# Patient Record
Sex: Male | Born: 1980
Health system: Southern US, Community
[De-identification: ages and names within clinical notes are randomized; demographics above are authoritative.]

## PROBLEM LIST (undated history)

## (undated) DIAGNOSIS — F988 Other specified behavioral and emotional disorders with onset usually occurring in childhood and adolescence: Secondary | ICD-10-CM

## (undated) DIAGNOSIS — I10 Essential (primary) hypertension: Secondary | ICD-10-CM

## (undated) DIAGNOSIS — I8390 Asymptomatic varicose veins of unspecified lower extremity: Secondary | ICD-10-CM

## (undated) DIAGNOSIS — K219 Gastro-esophageal reflux disease without esophagitis: Secondary | ICD-10-CM

## (undated) HISTORY — PX: TYMPANOSTOMY TUBE PLACEMENT: SHX32

## (undated) HISTORY — DX: Other specified behavioral and emotional disorders with onset usually occurring in childhood and adolescence: F98.8

## (undated) HISTORY — DX: Essential (primary) hypertension: I10

## (undated) HISTORY — DX: Gastro-esophageal reflux disease without esophagitis: K21.9

## (undated) HISTORY — DX: Asymptomatic varicose veins of unspecified lower extremity: I83.90

---

## 2006-02-04 ENCOUNTER — Ambulatory Visit: Payer: Self-pay | Admitting: Family Medicine

## 2006-07-25 ENCOUNTER — Emergency Department (HOSPITAL_COMMUNITY): Admission: EM | Admit: 2006-07-25 | Discharge: 2006-07-25 | Payer: Self-pay | Admitting: Family Medicine

## 2006-08-14 ENCOUNTER — Ambulatory Visit: Payer: Self-pay | Admitting: Family Medicine

## 2006-08-18 ENCOUNTER — Ambulatory Visit: Payer: Self-pay | Admitting: Family Medicine

## 2006-08-30 ENCOUNTER — Ambulatory Visit: Payer: Self-pay | Admitting: Family Medicine

## 2006-09-17 ENCOUNTER — Emergency Department (HOSPITAL_COMMUNITY): Admission: EM | Admit: 2006-09-17 | Discharge: 2006-09-17 | Payer: Self-pay | Admitting: Family Medicine

## 2006-11-09 ENCOUNTER — Ambulatory Visit: Payer: Self-pay | Admitting: Family Medicine

## 2006-11-29 ENCOUNTER — Ambulatory Visit: Payer: Self-pay | Admitting: Family Medicine

## 2006-11-29 LAB — CONVERTED CEMR LAB
ALT: 28 units/L (ref 0–40)
AST: 40 units/L — ABNORMAL HIGH (ref 0–37)
Albumin: 4.1 g/dL (ref 3.5–5.2)
Alkaline Phosphatase: 62 units/L (ref 39–117)
BUN: 7 mg/dL (ref 6–23)
Basophils Absolute: 0 10*3/uL (ref 0.0–0.1)
Basophils Relative: 0.1 % (ref 0.0–1.0)
CO2: 31 meq/L (ref 19–32)
Calcium: 9 mg/dL (ref 8.4–10.5)
Chloride: 102 meq/L (ref 96–112)
Creatinine, Ser: 0.9 mg/dL (ref 0.4–1.5)
Eosinophils Absolute: 0 10*3/uL (ref 0.0–0.6)
Eosinophils Relative: 0.3 % (ref 0.0–5.0)
GFR calc Af Amer: 130 mL/min
GFR calc non Af Amer: 108 mL/min
Glucose, Bld: 84 mg/dL (ref 70–99)
HCT: 46.7 % (ref 39.0–52.0)
Hemoglobin: 16 g/dL (ref 13.0–17.0)
Lymphocytes Relative: 45.4 % (ref 12.0–46.0)
MCHC: 34.2 g/dL (ref 30.0–36.0)
MCV: 87.2 fL (ref 78.0–100.0)
Monocytes Absolute: 1.1 10*3/uL — ABNORMAL HIGH (ref 0.2–0.7)
Monocytes Relative: 12.6 % — ABNORMAL HIGH (ref 3.0–11.0)
Neutro Abs: 3.6 10*3/uL (ref 1.4–7.7)
Neutrophils Relative %: 41.6 % — ABNORMAL LOW (ref 43.0–77.0)
Platelets: 278 10*3/uL (ref 150–400)
Potassium: 3.8 meq/L (ref 3.5–5.1)
RBC: 5.35 M/uL (ref 4.22–5.81)
RDW: 12.5 % (ref 11.5–14.6)
Sodium: 139 meq/L (ref 135–145)
Total Bilirubin: 0.6 mg/dL (ref 0.3–1.2)
Total Protein: 7.1 g/dL (ref 6.0–8.3)
WBC: 8.6 10*3/uL (ref 4.5–10.5)

## 2006-12-12 ENCOUNTER — Emergency Department (HOSPITAL_COMMUNITY): Admission: EM | Admit: 2006-12-12 | Discharge: 2006-12-12 | Payer: Self-pay | Admitting: Emergency Medicine

## 2006-12-24 ENCOUNTER — Ambulatory Visit: Payer: Self-pay | Admitting: Internal Medicine

## 2007-01-25 ENCOUNTER — Ambulatory Visit: Payer: Self-pay | Admitting: Internal Medicine

## 2007-04-19 ENCOUNTER — Telehealth (INDEPENDENT_AMBULATORY_CARE_PROVIDER_SITE_OTHER): Payer: Self-pay | Admitting: *Deleted

## 2007-04-26 ENCOUNTER — Ambulatory Visit: Payer: Self-pay | Admitting: Family Medicine

## 2007-04-26 DIAGNOSIS — H6980 Other specified disorders of Eustachian tube, unspecified ear: Secondary | ICD-10-CM | POA: Insufficient documentation

## 2007-08-18 ENCOUNTER — Telehealth (INDEPENDENT_AMBULATORY_CARE_PROVIDER_SITE_OTHER): Payer: Self-pay | Admitting: *Deleted

## 2007-09-08 ENCOUNTER — Ambulatory Visit: Payer: Self-pay | Admitting: Family Medicine

## 2007-09-08 LAB — CONVERTED CEMR LAB: Rapid Strep: NEGATIVE

## 2007-12-27 ENCOUNTER — Telehealth (INDEPENDENT_AMBULATORY_CARE_PROVIDER_SITE_OTHER): Payer: Self-pay | Admitting: Internal Medicine

## 2008-01-31 ENCOUNTER — Telehealth (INDEPENDENT_AMBULATORY_CARE_PROVIDER_SITE_OTHER): Payer: Self-pay | Admitting: Internal Medicine

## 2008-02-25 ENCOUNTER — Encounter: Payer: Self-pay | Admitting: Internal Medicine

## 2008-02-25 DIAGNOSIS — J4521 Mild intermittent asthma with (acute) exacerbation: Secondary | ICD-10-CM | POA: Insufficient documentation

## 2008-02-25 DIAGNOSIS — T7840XA Allergy, unspecified, initial encounter: Secondary | ICD-10-CM | POA: Insufficient documentation

## 2008-02-25 DIAGNOSIS — J45909 Unspecified asthma, uncomplicated: Secondary | ICD-10-CM | POA: Insufficient documentation

## 2008-03-22 ENCOUNTER — Ambulatory Visit: Payer: Self-pay | Admitting: Family Medicine

## 2008-03-22 DIAGNOSIS — G4700 Insomnia, unspecified: Secondary | ICD-10-CM | POA: Insufficient documentation

## 2008-04-03 ENCOUNTER — Telehealth (INDEPENDENT_AMBULATORY_CARE_PROVIDER_SITE_OTHER): Payer: Self-pay | Admitting: Internal Medicine

## 2008-06-11 ENCOUNTER — Ambulatory Visit: Payer: Self-pay | Admitting: Family Medicine

## 2008-06-11 DIAGNOSIS — L0291 Cutaneous abscess, unspecified: Secondary | ICD-10-CM | POA: Insufficient documentation

## 2008-06-11 DIAGNOSIS — L0390 Cellulitis, unspecified: Secondary | ICD-10-CM

## 2008-12-10 ENCOUNTER — Telehealth (INDEPENDENT_AMBULATORY_CARE_PROVIDER_SITE_OTHER): Payer: Self-pay | Admitting: *Deleted

## 2008-12-11 ENCOUNTER — Telehealth (INDEPENDENT_AMBULATORY_CARE_PROVIDER_SITE_OTHER): Payer: Self-pay | Admitting: Internal Medicine

## 2008-12-12 ENCOUNTER — Encounter (INDEPENDENT_AMBULATORY_CARE_PROVIDER_SITE_OTHER): Payer: Self-pay | Admitting: Internal Medicine

## 2009-01-15 ENCOUNTER — Telehealth (INDEPENDENT_AMBULATORY_CARE_PROVIDER_SITE_OTHER): Payer: Self-pay | Admitting: Internal Medicine

## 2009-04-05 ENCOUNTER — Ambulatory Visit: Payer: Self-pay | Admitting: Family Medicine

## 2009-04-05 DIAGNOSIS — F909 Attention-deficit hyperactivity disorder, unspecified type: Secondary | ICD-10-CM | POA: Insufficient documentation

## 2009-04-05 DIAGNOSIS — A6000 Herpesviral infection of urogenital system, unspecified: Secondary | ICD-10-CM

## 2009-04-05 DIAGNOSIS — A6002 Herpesviral infection of other male genital organs: Secondary | ICD-10-CM | POA: Insufficient documentation

## 2009-04-10 LAB — CONVERTED CEMR LAB
ALT: 18 units/L (ref 0–53)
AST: 23 units/L (ref 0–37)
Albumin: 4.2 g/dL (ref 3.5–5.2)
Alkaline Phosphatase: 49 units/L (ref 39–117)
BUN: 12 mg/dL (ref 6–23)
Basophils Absolute: 0 10*3/uL (ref 0.0–0.1)
Basophils Relative: 0 % (ref 0.0–3.0)
Bilirubin, Direct: 0.1 mg/dL (ref 0.0–0.3)
CO2: 30 meq/L (ref 19–32)
Calcium: 9.2 mg/dL (ref 8.4–10.5)
Chloride: 112 meq/L (ref 96–112)
Cholesterol: 164 mg/dL (ref 0–200)
Creatinine, Ser: 0.9 mg/dL (ref 0.4–1.5)
Eosinophils Absolute: 0 10*3/uL (ref 0.0–0.7)
Eosinophils Relative: 0.6 % (ref 0.0–5.0)
GFR calc non Af Amer: 105.43 mL/min (ref >60)
Glucose, Bld: 90 mg/dL (ref 70–99)
HCT: 47.8 % (ref 39.0–52.0)
HDL: 44.9 mg/dL (ref >39.00)
Hemoglobin: 16.6 g/dL (ref 13.0–17.0)
LDL Cholesterol: 108 mg/dL — ABNORMAL HIGH (ref 0–99)
Lymphocytes Relative: 44 % (ref 12.0–46.0)
Lymphs Abs: 3.2 10*3/uL (ref 0.7–4.0)
MCHC: 34.7 g/dL (ref 30.0–36.0)
MCV: 87.1 fL (ref 78.0–100.0)
Monocytes Absolute: 0.6 10*3/uL (ref 0.1–1.0)
Monocytes Relative: 7.8 % (ref 3.0–12.0)
Neutro Abs: 3.5 10*3/uL (ref 1.4–7.7)
Neutrophils Relative %: 47.6 % (ref 43.0–77.0)
Platelets: 242 10*3/uL (ref 150.0–400.0)
Potassium: 4.2 meq/L (ref 3.5–5.1)
RBC: 5.49 M/uL (ref 4.22–5.81)
RDW: 12.8 % (ref 11.5–14.6)
Sodium: 143 meq/L (ref 135–145)
TSH: 1.35 microintl units/mL (ref 0.35–5.50)
Total Bilirubin: 1 mg/dL (ref 0.3–1.2)
Total CHOL/HDL Ratio: 4
Total Protein: 7.2 g/dL (ref 6.0–8.3)
Triglycerides: 55 mg/dL (ref 0.0–149.0)
VLDL: 11 mg/dL (ref 0.0–40.0)
WBC: 7.3 10*3/uL (ref 4.5–10.5)

## 2009-04-29 ENCOUNTER — Emergency Department (HOSPITAL_COMMUNITY): Admission: EM | Admit: 2009-04-29 | Discharge: 2009-04-29 | Payer: Self-pay | Admitting: Emergency Medicine

## 2009-05-03 ENCOUNTER — Telehealth (INDEPENDENT_AMBULATORY_CARE_PROVIDER_SITE_OTHER): Payer: Self-pay | Admitting: Internal Medicine

## 2009-06-05 ENCOUNTER — Ambulatory Visit: Payer: Self-pay | Admitting: Family Medicine

## 2009-06-05 ENCOUNTER — Telehealth (INDEPENDENT_AMBULATORY_CARE_PROVIDER_SITE_OTHER): Payer: Self-pay | Admitting: Internal Medicine

## 2009-06-05 DIAGNOSIS — Z91038 Other insect allergy status: Secondary | ICD-10-CM | POA: Insufficient documentation

## 2009-07-10 ENCOUNTER — Telehealth (INDEPENDENT_AMBULATORY_CARE_PROVIDER_SITE_OTHER): Payer: Self-pay | Admitting: Internal Medicine

## 2009-08-08 ENCOUNTER — Telehealth (INDEPENDENT_AMBULATORY_CARE_PROVIDER_SITE_OTHER): Payer: Self-pay | Admitting: Internal Medicine

## 2009-09-02 ENCOUNTER — Telehealth (INDEPENDENT_AMBULATORY_CARE_PROVIDER_SITE_OTHER): Payer: Self-pay | Admitting: Internal Medicine

## 2009-09-16 ENCOUNTER — Telehealth (INDEPENDENT_AMBULATORY_CARE_PROVIDER_SITE_OTHER): Payer: Self-pay | Admitting: Internal Medicine

## 2009-10-14 ENCOUNTER — Telehealth: Payer: Self-pay | Admitting: Internal Medicine

## 2009-11-19 ENCOUNTER — Ambulatory Visit: Payer: Self-pay | Admitting: Family Medicine

## 2009-11-19 DIAGNOSIS — S335XXA Sprain of ligaments of lumbar spine, initial encounter: Secondary | ICD-10-CM | POA: Insufficient documentation

## 2009-12-19 ENCOUNTER — Telehealth: Payer: Self-pay | Admitting: Family Medicine

## 2010-01-23 ENCOUNTER — Telehealth: Payer: Self-pay | Admitting: Family Medicine

## 2010-03-05 ENCOUNTER — Telehealth: Payer: Self-pay | Admitting: Family Medicine

## 2010-04-10 ENCOUNTER — Telehealth: Payer: Self-pay | Admitting: Family Medicine

## 2010-05-13 ENCOUNTER — Telehealth: Payer: Self-pay | Admitting: Family Medicine

## 2010-06-11 ENCOUNTER — Telehealth: Payer: Self-pay | Admitting: Family Medicine

## 2010-06-26 ENCOUNTER — Telehealth: Payer: Self-pay | Admitting: Family Medicine

## 2010-07-08 ENCOUNTER — Telehealth: Payer: Self-pay | Admitting: Family Medicine

## 2010-08-11 ENCOUNTER — Telehealth: Payer: Self-pay | Admitting: Family Medicine

## 2010-09-10 ENCOUNTER — Telehealth: Payer: Self-pay | Admitting: Family Medicine

## 2010-09-11 ENCOUNTER — Ambulatory Visit: Payer: Self-pay | Admitting: Family Medicine

## 2010-10-09 ENCOUNTER — Telehealth: Payer: Self-pay | Admitting: Family Medicine

## 2010-10-10 ENCOUNTER — Telehealth: Payer: Self-pay | Admitting: Family Medicine

## 2010-10-20 ENCOUNTER — Telehealth: Payer: Self-pay | Admitting: Internal Medicine

## 2010-11-10 ENCOUNTER — Telehealth: Payer: Self-pay | Admitting: Family Medicine

## 2010-12-02 NOTE — Progress Notes (Signed)
Summary: needs refill on adderal  Phone Note Refill Request Call back at [phone] Message from:  Patient  Refills Requested: Medication #1:  ADDERALL XR 20 MG XR24H-CAP take 1 each morning for ADHD. Pt wants to pick this up on friday.  Please call when ready.  Initial call taken by: Lowella Petties CMA,  July 10, 2009 4:45 PM  Follow-up for Phone Call        refill completed   Billie-Lynn Tyler Deis FNP  July 10, 2009 5:02 PM   Patient notified as instructed by telephone.  Follow-up by: Lewanda Rife LPN,  July 11, 2009 8:12 AM    Prescriptions: ADDERALL XR 20 MG XR24H-CAP (AMPHETAMINE-DEXTROAMPHETAMINE) take 1 each morning for ADHD  #30 x 0   Entered and Authorized by:   Gildardo Griffes FNP   Signed by:   Gildardo Griffes FNP on 07/10/2009   Method used:   Print then Give to Patient   RxID:   [id number]

## 2010-12-02 NOTE — Progress Notes (Signed)
Summary: Rx for Nexium ?  Phone Note Call from Patient Call back at [phone]   Caller: Patient Call For: Bradley Coombe, FNP Summary of Call: Pt has been taking Zegerid and this is not on his insurance plan and he wants to know if this can be switched to Nexium 40 mg ?  Pt has been without his medication for 4 days.  The Nexium does not require a prior auth.  Pharmacy - [facility] [address] Initial call taken by: Sydell Axon,  January 31, 2008 4:14 PM  Follow-up for Phone Call        Rx sent electronically--let him know ..................................................................Marland KitchenBillie-Lynn Tyler Deis FNP  January 31, 2008 5:07 PM     New/Updated Medications: NEXIUM 40 MG  CPDR (ESOMEPRAZOLE MAGNESIUM) 1 each morning 30 min prior to bkft or other food   Prescriptions: NEXIUM 40 MG  CPDR (ESOMEPRAZOLE MAGNESIUM) 1 each morning 30 min prior to bkft or other food  #30 x 6   Entered and Authorized by:   Gildardo Griffes FNP   Signed by:   Gildardo Griffes FNP on 01/31/2008   Method used:   Electronically sent to ...       [facility] [address]       [address]       [place], [place]  [address]       Ph: [phone]       Fax: [phone]   RxID:   [id number]

## 2010-12-02 NOTE — Progress Notes (Signed)
Summary: prior auth needed for zegerid  Phone Note From Pharmacy   [facility]  [phone] [employer] Summary of Call: Prior Berkley Harvey is needed for zegerid, form is on your shelf Initial call taken by: Lowella Petties,  December 11, 2008 4:38 PM  Follow-up for Phone Call        is zegerid on his "approved" list?  why does he want to change from Nexium?  Billie-Lynn Tyler Deis FNP  December 11, 2008 5:03 PM   Additional Follow-up for Phone Call Additional follow up Details #1::        Because zegerid works better for his cough, which is more of a problem than the reflux.  Insurance might approve it if they know he has failed other meds. Additional Follow-up by: Lowella Petties,  December 11, 2008 5:06 PM    Additional Follow-up for Phone Call Additional follow up Details #2::    what PPI has he been on other than zegred and nexium?  did he fail nexium?  Billie-Lynn Tyler Deis FNP  December 11, 2008 5:18 PM     I think I wrote prevacid OTC on that form.                Lowella Petties  December 12, 2008 8:07 AM  form completed  Billie-Lynn Tyler Deis FNP  December 12, 2008 1:39 PM   Form faxed          Lowella Petties  December 12, 2008 2:45 PM   Additional Follow-up for Phone Call Additional follow up Details #3:: Details for Additional Follow-up Action Taken: Prior auth given Additional Follow-up by: Lowella Petties,  December 14, 2008 10:26 AM

## 2010-12-02 NOTE — Progress Notes (Signed)
Summary: refill request for adderall  Phone Note Refill Request Call back at [phone] Message from:  Patient  Refills Requested: Medication #1:  ADDERALL XR 20 MG XR24H-CAP take 1 each morning for ADHD. Phoned request from pt, please call when ready.  Initial call taken by: Lowella Petties CMA,  August 08, 2009 11:52 AM  Follow-up for Phone Call         Rx completed  Billie-Lynn Tyler Deis FNP  August 08, 2009 12:28 PM   Patient notified as instructed by telephone. Prescription left at front desk.  Follow-up by: Lewanda Rife LPN,  August 08, 2009 12:35 PM    Prescriptions: ADDERALL XR 20 MG XR24H-CAP (AMPHETAMINE-DEXTROAMPHETAMINE) take 1 each morning for ADHD  #30 x 0   Entered and Authorized by:   Gildardo Griffes FNP   Signed by:   Gildardo Griffes FNP on 08/08/2009   Method used:   Print then Give to Patient   RxID:   [id number]

## 2010-12-02 NOTE — Progress Notes (Signed)
Summary: Adderall Rx  Phone Note Call from Patient Call back at [phone]   Caller: Bradley Schaefer patient Call For: Gildardo Griffes FNP Summary of Call: Patient called for Prescription for Adderall. Would like to pick up on Wed 05/08/09. Wants to know if Willaim Sheng would let him take Adderall 10mg  in AM and 5mg  in PM. Please advise. Initial call taken by: Lewanda Rife,  May 03, 2009 5:12 PM  Follow-up for Phone Call        Rx attatched  Billie-Lynn Tyler Deis FNP  May 03, 2009 5:38 PM   Patient notified as instructed by telephone. RX is at the front desk. Follow-up by: Lewanda Rife LPN,  May 07, 980 10:52 AM    New/Updated Medications: ADDERALL 5 MG TABS (AMPHETAMINE-DEXTROAMPHETAMINE) take 2 each morning and 1 in the early afternoon   Prescriptions: ADDERALL 5 MG TABS (AMPHETAMINE-DEXTROAMPHETAMINE) take 2 each morning and 1 in the early afternoon  #90 x 0   Entered and Authorized by:   Gildardo Griffes FNP   Signed by:   Gildardo Griffes FNP on 05/03/2009   Method used:   Print then Give to Patient   RxID:   [id number]

## 2010-12-02 NOTE — Progress Notes (Signed)
Summary: rx's  Medications Added ALLEGRA 180 MG  TABS (FEXOFENADINE HCL) 1 once daily * NASONEX NASAL SPRAY 2 sprays each nostril once daily prn [BMN] ACYCLOVIR 800 MG  TABS (ACYCLOVIR) 1 qd       Phone Note Call from Patient Call back at [phone]   Caller: Patient Call For: bean Summary of Call: needs 90x3 new rxs due to ins change needs allegra 180mg , nasonex, and he's on valtrex 1 gm but wants to switch to acyclovir 90 day rx Initial call taken by: Liane Comber,  April 19, 2007 9:06 AM  Follow-up for Phone Call        need chart  Additional Follow-up for Phone Call Additional follow up Details #1::        LEFT MESSAGE ON MACHINE RX LEFT AT FRONT DESK FOR PATIENT   Additional Follow-up by: Liane Comber,  April 20, 2007 8:40 AM  New/Updated Medications: ALLEGRA 180 MG  TABS (FEXOFENADINE HCL) 1 once daily * NASONEX NASAL SPRAY 2 sprays each nostril once daily prn [BMN] ACYCLOVIR 800 MG  TABS (ACYCLOVIR) 1 qd  New/Updated Medications: ALLEGRA 180 MG  TABS (FEXOFENADINE HCL) 1 once daily * NASONEX NASAL SPRAY 2 sprays each nostril once daily prn [BMN] ACYCLOVIR 800 MG  TABS (ACYCLOVIR) 1 qd  Prescriptions: ACYCLOVIR 800 MG  TABS (ACYCLOVIR) 1 qd  #90 x 3   Entered and Authorized by:   Gildardo Griffes FNP   Signed by:   Gildardo Griffes FNP on 04/20/2007   Method used:   Print then Give to Patient   RxID:   [id number] NASONEX NASAL SPRAY 2 sprays each nostril once daily prn Brand medically necessary #3 x 3   Entered and Authorized by:   Gildardo Griffes FNP   Signed by:   Gildardo Griffes FNP on 04/20/2007   Method used:   Print then Give to Patient   RxID:   [id number] ALLEGRA 180 MG  TABS (FEXOFENADINE HCL) 1 once daily  #90 x 3   Entered and Authorized by:   Gildardo Griffes FNP   Signed by:   Gildardo Griffes FNP on 04/20/2007   Method used:   Print then Give to Patient   RxID:    [id number]

## 2010-12-02 NOTE — Progress Notes (Signed)
Summary: refill request for adderall  Phone Note Refill Request Call back at [phone]   Refills Requested: Medication #1:  ADDERALL XR 25 MG XR24H-CAP 1 tab by mouth qam. Please call pt when ready.  Initial call taken by: Lowella Petties CMA,  August 11, 2010 3:39 PM    Prescriptions: ADDERALL XR 25 MG XR24H-CAP (AMPHETAMINE-DEXTROAMPHETAMINE) 1 tab by mouth qam  #30 x 0   Entered and Authorized by:   Ruthe Mannan MD   Signed by:   Ruthe Mannan MD on 08/11/2010   Method used:   Print then Give to Patient   RxID:   [id number]   Appended Document: refill request for adderall Left message on cell phone voicemail Rx ready for pick up will be left at front desk.

## 2010-12-02 NOTE — Assessment & Plan Note (Signed)
Summary: TROUBLE SLEEPING/CLE   Vital Signs:  Patient Profile:   30 Years Old Male Weight:      179 pounds Temp:     98.5 degrees F oral Pulse rate:   79 / minute BP sitting:   124 / 86  (left arm) Cuff size:   regular  Vitals Entered By: Cooper Render (Mar 22, 2008 9:35 AM)                 Chief Complaint:  problems sleeping.  History of Present Illness: Here due to problems sleeping x2 and 1/2 wks--mind not turning off.   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 Has been using Melatonin --rare coffee, 1-2sodas/wk, tea--rare, chocolate occ.   no HA meds --to bed 9:30 to 10:30pm, up   ~5:30am --works out early 3-5xwk--3 mile run and wts --eats su[pper 5-7pm. 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  NAD Lungs:     normal respiratory effort, no intercostal retractions, no accessory muscle use, and normal breath sounds.   Heart:     normal rate, regular rhythm, and no murmur.   Neurologic:     alert & oriented X3, strength normal in all extremities, sensation intact to light touch, and gait normal.   Psych:     Oriented X3, normally interactive, good eye contact, not anxious appearing, and not depressed appearing.      Impression & Recommendations:  Problem # 1:  INSOMNIA (ICD-780.52) Assessment: New agrees to try rozerem at bedtime --samples given reviewed sleep hygeine see back as needed. His updated medication list for this problem includes:    Rozerem 8 Mg Tabs (Ramelteon) .Marland Kitchen... 1 before hs   Complete Medication List: 1)  Allegra 180 Mg  Tabs (Fexofenadine hcl) .Marland Kitchen.. 1 once daily as needed 2)  Nasonex Nasal Spray  .Marland Kitchen.. 2 sprays each nostril once daily prn 3)  Valtrex 1 Gm Tabs (Valacyclovir hcl) .... Take 1 tablet by mouth once a day 4)  Ventolin Hfa 108 (90 Base) Mcg/act Aers (Albuterol sulfate) .... 2 puffs q4h as needed wheezing 5)  Nexium 40 Mg Cpdr (Esomeprazole magnesium) .Marland Kitchen.. 1 each morning 30 min prior to bkft or other food 6)  Rozerem 8 Mg Tabs (Ramelteon) .Marland Kitchen.. 1 before hs    ] Prior Medications (reviewed today): ALLEGRA 180 MG  TABS (FEXOFENADINE HCL) 1 once daily as needed NASONEX NASAL SPRAY () 2 sprays each nostril once daily prn VALTREX 1 GM  TABS (VALACYCLOVIR HCL) Take 1 tablet by mouth once a day VENTOLIN HFA 108 (90 BASE) MCG/ACT  AERS (ALBUTEROL SULFATE) 2 puffs q4h as needed wheezing NEXIUM 40 MG  CPDR (ESOMEPRAZOLE MAGNESIUM) 1 each morning 30 min prior to bkft or other food Current Allergies: No known allergies

## 2010-12-02 NOTE — Progress Notes (Signed)
Summary: Rx Rozerem  Phone Note Call from Patient Call back at [phone]   Caller: Patient Call For: Everrett Coombe, FNP Summary of Call: Pt was given samples of Rozerem about 2 weeks ago and now would like an rx called to the pharmacy.  Pharmacy- [facility] [address] Initial call taken by: Sydell Axon,  April 03, 2008 4:52 PM  Follow-up for Phone Call        Rx sent electronically   Billie-Lynn Tyler Deis FNP  April 03, 2008 5:10 PM   Additional Follow-up for Phone Call Additional follow up Details #1::        med phoned to pharmacy. msg left on voice mail. Additional Follow-up by: Cooper Render,  April 03, 2008 5:19 PM      Prescriptions: ROZEREM 8 MG  TABS (RAMELTEON) 1 before hs  #30 x 6   Entered and Authorized by:   Gildardo Griffes FNP   Signed by:   Gildardo Griffes FNP on 04/03/2008   Method used:   Electronically sent to ...       [facility]       [address]       [place], [place]  [address]       Ph: [phone]       Fax: [phone]   RxID:   [id number]

## 2010-12-02 NOTE — Progress Notes (Signed)
Summary: refill request for valtrex  Phone Note Refill Request Message from:  Fax from Pharmacy  Refills Requested: Medication #1:  VALTREX 1 GM  TABS Take 1 tablet by mouth once a day   Last Refilled: 06/26/2010 Faxed request from [facility].  Initial call taken by: Lowella Petties CMA, AAMA,  October 10, 2010 10:22 AM    Prescriptions: VALTREX 1 GM  TABS (VALACYCLOVIR HCL) Take 1 tablet by mouth once a day  #90 x 0   Entered and Authorized by:   Ruthe Mannan MD   Signed by:   Ruthe Mannan MD on 10/10/2010   Method used:   Electronically to        [facility]* (retail)       [address]       [address] [place] [facility]       [place], [place]  [address]       Ph: [phone]       Fax: [phone]   RxID:   [id number]

## 2010-12-02 NOTE — Progress Notes (Signed)
Summary: refill request for adderall  Phone Note Refill Request Call back at [phone] Message from:  Patient  Refills Requested: Medication #1:  ADDERALL XR 25 MG XR24H-CAP 1 tab by mouth qam. Please call pt when ready.  Initial call taken by: Lowella Petties CMA, AAMA,  September 10, 2010 12:32 PM  Follow-up for Phone Call        Patient notified via telephone, Rx ready for pick up will be left at front desk. Follow-up by: Linde Gillis CMA Duncan Dull),  September 10, 2010 12:36 PM    Prescriptions: ADDERALL XR 25 MG XR24H-CAP (AMPHETAMINE-DEXTROAMPHETAMINE) 1 tab by mouth qam  #30 x 0   Entered and Authorized by:   Ruthe Mannan MD   Signed by:   Ruthe Mannan MD on 09/10/2010   Method used:   Print then Give to Patient   RxID:   [id number]

## 2010-12-02 NOTE — Progress Notes (Signed)
Summary: refill request for adderall  Phone Note Refill Request Call back at [phone] Message from:  Patient  Refills Requested: Medication #1:  ADDERALL XR 25 MG XR24H-CAP 1 tab by mouth qam Please call pt when ready.  Initial call taken by: Lowella Petties CMA, AAMA,  October 09, 2010 11:30 AM  Follow-up for Phone Call        Left message on cell phone voicemail advising patient that Rx was ready for pick up will be left at front desk. Follow-up by: Linde Gillis CMA Duncan Dull),  October 09, 2010 11:53 AM    Prescriptions: ADDERALL XR 25 MG XR24H-CAP (AMPHETAMINE-DEXTROAMPHETAMINE) 1 tab by mouth qam  #30 x 0   Entered and Authorized by:   Ruthe Mannan MD   Signed by:   Ruthe Mannan MD on 10/09/2010   Method used:   Print then Give to Patient   RxID:   [id number]

## 2010-12-02 NOTE — Assessment & Plan Note (Signed)
Summary: CPX,REFILL MEDS/CLE   Vital Signs:  Patient profile:   30 year old male Height:      67.75 inches Weight:      173.50 pounds BMI:     26.67 Temp:     98.1 degrees F oral Pulse rate:   72 / minute Pulse rhythm:   regular BP sitting:   130 / 90  (left arm) Cuff size:   regular  Vitals Entered By: Lewanda Rife (April 05, 2009 8:57 AM)  CC:  complete physical and med refills.  History of Present Illness: Here for CPX and med refills --zegerid OTC 20 mg working fine--uses daily --genital herpes--taking valtrex 500-1000mg  at bedtime--flairs if does not take --has hx of ADHD--took adderall and Strattera--nausea, in college--adderall worked best, had some wt loss.  not focused at work, not getting projects completed at home--more of a problem since has gone back to hospital to work--more confusion in the ICU.  wife aware of this  Preventive Screening-Counseling & Management  Alcohol-Tobacco     Alcohol drinks/day: 2     Alcohol type: beer, mixed     Smoking Status: never  Caffeine-Diet-Exercise     Caffeine use/day: 1     Does Patient Exercise: yes     Type of exercise: treadmill--3-7 miles run, road bike, gym     Exercise (avg: min/session): 30-60     Times/week: 3  Problems Prior to Update: 1)  Cellulitis  (ICD-682.9) 2)  Insomnia  (ICD-780.52) 3)  Asthma  (ICD-493.90) 4)  Allergy  (ICD-995.3) 5)  Eustachian Tube Dysfunction, Bilateral  (ICD-381.81)  Medications Prior to Update: 1)  Allegra 180 Mg  Tabs (Fexofenadine Hcl) .Marland Kitchen.. 1 Once Daily As Needed 2)  Nasonex 50 Mcg/act  Susp (Mometasone Furoate) .... 2 Sprays Each Nostril Once Daily As Needed 3)  Valtrex 1 Gm  Tabs (Valacyclovir Hcl) .... Take 1 Tablet By Mouth Once A Day 4)  Ventolin Hfa 108 (90 Base) Mcg/act  Aers (Albuterol Sulfate) .... 2 Puffs Q4h As Needed Wheezing 5)  Nexium 40 Mg  Cpdr (Esomeprazole Magnesium) .Marland Kitchen.. 1 Each Morning 30 Min Prior To Bkft or Other Food 6)  Rozerem 8 Mg  Tabs (Ramelteon) .Marland Kitchen..  1 Tab Po 30 Min Before Bedtime As Needed 7)  Bactrim Ds 800-160 Mg  Tabs (Sulfamethoxazole-Trimethoprim) .... 2 Tab By Mouth Two Times A Day X 10days 8)  Zegerid 40-1100 Mg Caps (Omeprazole-Sodium Bicarbonate) .... 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  Risk Factors: Alcohol Use: 2 (04/05/2009) Caffeine Use: 1 (04/05/2009) Exercise: yes (04/05/2009)  Risk Factors: Smoking Status: never (04/05/2009)  Family History: Father: elevated lipids Mother: ulcerative colitis Siblings: 0  PGM--cervix and breast cacer, died of lung ca--smoker PGF--bladder cancer MGM--scleraderma --MGF--died of brain tumor at age 37   DM- MI- CVA-  Prostate Cancer- Breast Cancer- PGM Ovarian Cancer- Uterine Cancer- Colon Cancer- Drug/ ETOH Abuse- Depression-  Social History: Marital Status: Married Children: 1 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 ENT:  Denies nasal congestion, postnasal drainage, and sinus pressure. CV:  Denies chest pain or discomfort,  palpitations, swelling of feet, and swelling of hands. Resp:  Denies cough, shortness of breath, and wheezing. GI:  Denies abdominal pain, bloody stools, change in bowel habits, constipation, diarrhea, indigestion, nausea, and vomiting. GU:  Complains of genital sores and nocturia; denies decreased libido, erectile dysfunction, incontinence, and urinary hesitancy; nocturia--0-2. MS:  Denies joint pain and muscle aches. Derm:  Denies  lesion(s) and rash; uses sun screen. Neuro:  Complains of difficulty with concentration; denies disturbances in coordination, falling down, memory loss, seizures, tremors, and weakness. Psych:  Denies anxiety and depression.  Physical Exam  General:  alert, well-developed, well-nourished, and well-hydrated.   Head:  normocephalic, atraumatic, and no abnormalities observed.   Eyes:  pupils equal, pupils round, and no injection.   Ears:  R ear normal and L ear normal.   Nose:  no mucosal edema and no airflow obstruction.   Mouth:  good dentition, no exudates, and pharyngeal erythema.   Neck:  no masses, no thyromegaly, no thyroid nodules or tenderness, no JVD, and no carotid bruits.   Lungs:  normal respiratory effort, no intercostal retractions, no accessory muscle use, and normal breath sounds.   Heart:  normal rate, regular rhythm, and no murmur.   Abdomen:  soft, non-tender, normal bowel sounds, no distention, no masses, no guarding, no abdominal hernia, no inguinal hernia, no hepatomegaly, and no splenomegaly.   Msk:  no joint swelling, no joint warmth, no redness over joints, and no joint deformities.   Pulses:  R posterior tibial normal, R dorsalis pedis normal, L posterior tibial normal, and L dorsalis pedis normal.   Extremities:  no edema either lower leg Neurologic:  alert & oriented X3, sensation intact to light touch, gait normal, and DTRs symmetrical and normal.   Skin:  turgor normal, color normal, no rashes, and body piercing(s).   Cervical Nodes:  no anterior cervical adenopathy and no posterior cervical adenopathy.   Inguinal Nodes:  no R inguinal adenopathy and no L inguinal adenopathy.   Psych:  normally interactive and good eye contact.     Impression & Recommendations:  Problem # 1:  HEALTH MAINTENANCE EXAM (ICD-V70.0) well 30 yr old male will get baeline labs and tx if needed Orders: Venipuncture (02725) TLB-Lipid Panel (80061-LIPID) TLB-BMP (Basic Metabolic  Panel-BMET) (80048-METABOL) TLB-CBC Platelet - w/Differential (85025-CBCD) TLB-Hepatic/Liver Function Pnl (80076-HEPATIC) TLB-TSH (Thyroid Stimulating Hormone) (84443-TSH)  Problem # 2:  GENITAL HERPES (ICD-054.10) Assessment: Unchanged continue on daily supression with valtrex  Problem # 3:  ADHD (ICD-314.01) Assessment: Unchanged will try on adderall 5 once daily x 1 wk, then increase to 1 two times a day--discused use on days off and may need to titrate up to call response and will titrate on phone for 1 mo or so--agrees  Complete Medication List: 1)  Allegra 180 Mg Tabs (Fexofenadine hcl) .Marland Kitchen.. 1 once daily as needed 2)  Nasonex 50 Mcg/act Susp (Mometasone furoate) .... 2 sprays each nostril once daily as needed 3)  Valtrex 1 Gm Tabs (Valacyclovir hcl) .... Take 1 tablet by mouth once a day 4)  Ventolin Hfa 108 (90 Base) Mcg/act Aers (Albuterol sulfate) .... 2 puffs every four hours as needed wheezing 5)  Rozerem 8 Mg Tabs (Ramelteon) .Marland Kitchen.. 1 tab po 30 min before bedtime as needed 6)  Zegerid 20-1100 Mg Caps (Omeprazole-sodium bicarbonate) .... Otc one capsule by mouth at bedtime 7)  Adderall 5 Mg Tabs (Amphetamine-dextroamphetamine) .Marland Kitchen.. 1 each morning x 1wk, then advance to 1 two times a day for 3  wks Prescriptions: ADDERALL 5 MG TABS (AMPHETAMINE-DEXTROAMPHETAMINE) 1 each morning x 1wk, then advance to 1 two times a day for 3 wks  #49 x 0   Entered and Authorized by:   Gildardo Griffes FNP   Signed by:   Gildardo Griffes FNP on 04/05/2009   Method used:   Print then Give to Patient   RxID:   [id number] VALTREX 1 GM  TABS (VALACYCLOVIR HCL) Take 1 tablet by mouth once a day  #90 Each x 3   Entered and Authorized by:   Gildardo Griffes FNP   Signed by:   Gildardo Griffes FNP on 04/05/2009   Method used:   Print then Give to Patient   RxID:   [id number]   Current Allergies (reviewed today): No known allergies

## 2010-12-02 NOTE — Progress Notes (Signed)
Summary: adderall   Phone Note Refill Request Call back at [phone] Message from:  Patient on July 08, 2010 12:01 PM  Refills Requested: Medication #1:  ADDERALL XR 25 MG XR24H-CAP 1 tab by mouth qam. Please call patient when ready.   Initial call taken by: Melody Comas,  July 08, 2010 12:01 PM  Follow-up for Phone Call        Patient advised as instructed via telephone, Rx ready for pick up will be left at front desk. Follow-up by: Linde Gillis CMA Duncan Dull),  July 08, 2010 4:42 PM    Prescriptions: ADDERALL XR 25 MG XR24H-CAP (AMPHETAMINE-DEXTROAMPHETAMINE) 1 tab by mouth qam  #30 x 0   Entered and Authorized by:   Kerby Nora MD   Signed by:   Kerby Nora MD on 07/08/2010   Method used:   Print then Give to Patient   RxID:   [id number]

## 2010-12-02 NOTE — Progress Notes (Signed)
Summary: adderall refill and nasonex refill  Phone Note Refill Request Call back at [phone] Message from:  Patient on October 14, 2009 5:00 PM  Refills Requested: Medication #1:  ADDERALL XR 20 MG XR24H-CAP take 1 each morning for ADHD.  Medication #2:  NASONEX 50 MCG/ACT  SUSP 2 sprays each nostril once daily as needed would like to pick up on 10-15-09. Please call when ready. Wants nasonex refill to go to [facility] out patient.   Initial call taken by: Melody Comas,  October 14, 2009 5:02 PM Caller: Patient Call For: Bradley Griffes FNP Summary of Call: Casimiro Needle ne  Follow-up for Phone Call        adderall written  nasonex refill sent  He is due for follow up to review the new dose of the adderall. He needs to make appt Follow-up by: Cindee Salt MD,  October 14, 2009 5:07 PM  Additional Follow-up for Phone Call Additional follow up Details #1::        Spoke with patient and advised rx ready for pick-up, also advised he needs to make a follow-up appt. Additional Follow-up by: Mervin Hack CMA (AAMA),  October 15, 2009 8:43 AM    New/Updated Medications: NASONEX 50 MCG/ACT  SUSP (MOMETASONE FUROATE) 2 sprays each nostril once daily as needed Prescriptions: NASONEX 50 MCG/ACT  SUSP (MOMETASONE FUROATE) 2 sprays each nostril once daily as needed  #1 x 11   Entered and Authorized by:   Cindee Salt MD   Signed by:   Cindee Salt MD on 10/14/2009   Method used:   Electronically to        [facility]* (retail)       [address]       [address] Shipping/mailing       [place], [place]  [address]       Ph: [phone]       Fax: [phone]   RxID:   [id number] ADDERALL XR 20 MG XR24H-CAP (AMPHETAMINE-DEXTROAMPHETAMINE) take 1 each morning for ADHD  #30 x 0   Entered and Authorized by:   Cindee Salt MD   Signed by:   Cindee Salt MD on 10/14/2009   Method used:   Print then Give to Patient  RxID:   [id number]

## 2010-12-02 NOTE — Progress Notes (Signed)
Summary: adderall  Phone Note Refill Request Call back at [phone]   Refills Requested: Medication #1:  ADDERALL XR 25 MG XR24H-CAP 1 tab by mouth qam.  Method Requested: Pick up at Office Initial call taken by: Melody Comas,  May 13, 2010 10:45 AM  Follow-up for Phone Call        Patient advised Rx ready for pick up will be left at front desk. Follow-up by: Linde Gillis CMA Duncan Dull),  May 13, 2010 10:58 AM    Prescriptions: ADDERALL XR 25 MG XR24H-CAP (AMPHETAMINE-DEXTROAMPHETAMINE) 1 tab by mouth qam  #30 x 0   Entered and Authorized by:   Ruthe Mannan MD   Signed by:   Ruthe Mannan MD on 05/13/2010   Method used:   Print then Give to Patient   RxID:   [id number]

## 2010-12-02 NOTE — Assessment & Plan Note (Signed)
Summary: REFILL ADDEROL RX/CLE   Vital Signs:  Patient profile:   30 year old male Height:      67.75 inches Weight:      175 pounds BMI:     26.90 Temp:     99.4 degrees F oral Pulse rate:   84 / minute Pulse rhythm:   regular BP sitting:   152 / 88  (left arm) Cuff size:   regular  Vitals Entered By: Delilah Shan CMA Duncan Dull) (November 19, 2009 9:22 AM) CC: Refill Adderall   History of Present Illness: here to discuss Adderall dose  He  wants to increase to 25 mg.  Feels after a few hours, gets distracted at work.  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   Has had no issues with insomina or decreased appetite. No n/v/d. Does not feel jittery.  Back pain-  Has been mountain biking and lifts patients at work.  Noticed some lumbar pain that is worse when he has been sitting for awhile.  Gets better with walking.  No numbness, tingling, or weakness of legs.  Current Medications (verified): 1)  Allegra 180 Mg  Tabs (Fexofenadine Hcl) .Marland Kitchen.. 1 Once Daily As Needed 2)  Nasonex 50 Mcg/act  Susp (Mometasone Furoate) .... 2 Sprays Each Nostril Once Daily As Needed 3)  Valtrex 1 Gm  Tabs (Valacyclovir Hcl) .... Take 1 Tablet By Mouth Once A Day 4)  Ventolin Hfa 108 (90 Base) Mcg/act  Aers (Albuterol Sulfate) .... 2 Puffs Every Four Hours As Needed Wheezing 5)  Rozerem 8 Mg  Tabs (Ramelteon) .Marland Kitchen.. 1 Tab Po 30 Min Before Bedtime As Needed 6)  Zegerid 20-1100 Mg Caps (Omeprazole-Sodium Bicarbonate) .... Otc One Capsule By Mouth At Bedtime 7)  Adderall Xr 25 Mg Xr24h-Cap (Amphetamine-Dextroamphetamine) .Marland Kitchen.. 1 Tab By Mouth Qam  Allergies (verified): No Known Drug Allergies  Review of Systems      See HPI General:  Denies loss of appetite and weight loss. Neuro:  Denies headaches, memory loss, visual disturbances, and weakness.  Physical Exam  General:  alert, well-developed, well-nourished, and well-hydrated.   Lungs:  normal respiratory effort, no intercostal  retractions, no accessory muscle use, and normal breath sounds.   Heart:  normal rate, regular rhythm, and no murmur.   Msk:  Back, mild tightness of lumbar paraspinous muscles, no tenderness. Normal ROM. SLR neg bilaterally. Neg Fabers. Psych:  normally interactive, good eye contact, not anxious appearing, and not depressed appearing.     Impression & Recommendations:  Problem # 1:  ADHD (ICD-314.01) Assessment Deteriorated Will increase adderall dosage to 25 mg.  Discussed possible side effects.  Pt in agreement.  Problem # 2:  LUMBAR STRAIN, ACUTE (ICD-847.2) Assessment: New Continue supportive care.  See patient instructions.  Complete Medication List: 1)  Allegra 180 Mg Tabs (Fexofenadine hcl) .Marland Kitchen.. 1 once daily as needed 2)  Nasonex 50 Mcg/act Susp (Mometasone furoate) .... 2 sprays each nostril once daily as needed 3)  Valtrex 1 Gm Tabs (Valacyclovir hcl) .... Take 1 tablet by mouth once a day 4)  Ventolin Hfa 108 (90 Base) Mcg/act Aers (Albuterol sulfate) .... 2 puffs every four hours as needed wheezing 5)  Rozerem 8 Mg Tabs (Ramelteon) .Marland Kitchen.. 1 tab po 30 min before bedtime as needed 6)  Zegerid 20-1100 Mg Caps (Omeprazole-sodium bicarbonate) .... Otc one capsule by mouth at bedtime 7)  Adderall Xr 25 Mg Xr24h-cap (Amphetamine-dextroamphetamine) .Marland Kitchen.. 1 tab by mouth qam  Patient Instructions: 1)  Most patients (90%) with low back pain will improve with time ( 2-6 weeks). Keep active but avoid activities that are painful. Apply moist heat and/or ice to lower back several times a day.  Prescriptions: ADDERALL XR 25 MG XR24H-CAP (AMPHETAMINE-DEXTROAMPHETAMINE) 1 tab by mouth qam  #30 x 0   Entered and Authorized by:   Ruthe Mannan MD   Signed by:   Ruthe Mannan MD on 11/19/2009   Method used:   Print then Give to Patient   RxID:   [id number]   Current Allergies (reviewed today): No known allergies

## 2010-12-02 NOTE — Progress Notes (Signed)
Summary: adderal xr will be ok  Phone Note Call from Patient Call back at [phone]   Caller: Patient Call For: Gildardo Griffes FNP Summary of Call: Pt called to say that he checked on the price of the adderal XR and says that will be fine. Initial call taken by: Lowella Petties CMA,  June 05, 2009 11:17 AM  Follow-up for Phone Call        noted  Billie-Lynn Tyler Deis FNP  June 05, 2009 11:19 AM

## 2010-12-02 NOTE — Progress Notes (Signed)
Summary: verify med dose   Phone Note From Pharmacy Call back at [phone]   Caller: pharmacy Call For: bean  Summary of Call: what strength of zegerid is pt to be on, they have 20 and 40 mg on file. Initial call taken by: Liane Comber,  August 18, 2007 10:38 AM  Follow-up for Phone Call        40 mg, thanks  ..................................................................Marland KitchenGildardo Griffes FNP  August 18, 2007 12:02 PM   Additional Follow-up for Phone Call Additional follow up Details #1::        Pharmacist called Additional Follow-up by: Liane Comber,  August 18, 2007 1:37 PM

## 2010-12-02 NOTE — Progress Notes (Signed)
Summary: refill request for adderall  Phone Note Refill Request Call back at [phone] Message from:  Patient  Refills Requested: Medication #1:  ADDERALL XR 25 MG XR24H-CAP 1 tab by mouth qam. Please call when ready.  Initial call taken by: Lowella Petties CMA,  April 10, 2010 10:41 AM    Prescriptions: ADDERALL XR 25 MG XR24H-CAP (AMPHETAMINE-DEXTROAMPHETAMINE) 1 tab by mouth qam  #30 x 0   Entered and Authorized by:   Ruthe Mannan MD   Signed by:   Ruthe Mannan MD on 04/10/2010   Method used:   Print then Give to Patient   RxID:   [id number]   Appended Document: refill request for adderall Patient advised via message left on cell phone voicemail, Rx ready for pick up will be left at front desk.

## 2010-12-02 NOTE — Assessment & Plan Note (Signed)
Summary: MED F/U DLO   Vital Signs:  Patient profile:   30 year old male Height:      67.75 inches Weight:      169 pounds BMI:     25.98 Temp:     98.4 degrees F oral Pulse rate:   80 / minute Pulse rhythm:   regular BP sitting:   136 / 88  (left arm) Cuff size:   regular  Vitals Entered By: Lewanda Rife LPN (June 05, 2009 10:02 AM)  CC:  Follow up to discuss Adderall.  History of Present Illness: here to discuss Adderall dose --wants to increase to 10mg  qam and 5 q pm--running out in the afternoon--works 12 hr shifts at [employer] --used XR when in college and found coverage better--wolud like to try if coverage good  Has been stung x3 this summer--remains swollen and itching for several days --has had no respitory problems ever --usually takes antihistamine several hrs ot 1d later and uses benadryl cream or steriod cream OTC--helps  Allergies (verified): No Known Drug Allergies  Past History:  Past Medical History: Reviewed history from 02/25/2008 and no changes required. 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   Neck:  no JVD and no carotid bruits.   Lungs:  normal respiratory effort, no intercostal retractions, no accessory muscle use, and normal breath sounds.   Heart:  normal rate, regular rhythm, and no murmur.   Skin:  turgor normal, color normal, and no rashes.   Psych:  normally interactive, good eye contact, not anxious appearing, and not depressed appearing.     Impression & Recommendations:  Problem # 1:  ADHD (ICD-314.01) he will call pharmacy re co-pay for XR rather than immediate release--will notify  coverage is good--will start on adderall XR 20 mg qam--call response, titrate as needed see back in 2-3 mo or as needed   Problem # 2:  ALLERGY TO INSECTS AND ARACHNIDS  (ICD-V15.06) Assessment: New to carry antinistamine at all times  Complete Medication List: 1)  Allegra 180 Mg Tabs (Fexofenadine hcl) .Marland Kitchen.. 1 once daily as needed 2)  Nasonex 50 Mcg/act Susp (Mometasone furoate) .... 2 sprays each nostril once daily as needed 3)  Valtrex 1 Gm Tabs (Valacyclovir hcl) .... Take 1 tablet by mouth once a day 4)  Ventolin Hfa 108 (90 Base) Mcg/act Aers (Albuterol sulfate) .... 2 puffs every four hours as needed wheezing 5)  Rozerem 8 Mg Tabs (Ramelteon) .Marland Kitchen.. 1 tab po 30 min before bedtime as needed 6)  Zegerid 20-1100 Mg Caps (Omeprazole-sodium bicarbonate) .... Otc one capsule by mouth at bedtime 7)  Adderall Xr 20 Mg Xr24h-cap (Amphetamine-dextroamphetamine) .... Take 1 each morning for adhd Prescriptions: ADDERALL XR 20 MG XR24H-CAP (AMPHETAMINE-DEXTROAMPHETAMINE) take 1 each morning for ADHD  #30 x 0   Entered and Authorized by:   Gildardo Griffes FNP   Signed by:   Gildardo Griffes FNP on 06/05/2009   Method used:   Print then Give to Patient   RxID:   [id number]   Current Allergies (reviewed today): No known allergies  Appended Document: MED F/U DLO Prescription for Adderall XR left at front desk. Also Clarinex samples left in business office for pt when he picks up his Rx. Patient notified as instructed by telephone.

## 2010-12-02 NOTE — Assessment & Plan Note (Signed)
Summary: EAR/CLE  Medications Added ZEGERID   CAPS (OMEPRAZOLE-SODIUM BICARBONATE CAPS) Take 1 capsule by mouth once a day VALTREX 1 GM  TABS (VALACYCLOVIR HCL) Take 1 tablet by mouth once a day        Vital Signs:  Patient Profile:   30 Years Old Male Weight:      159 pounds Temp:     98.6 degrees F oral Pulse rate:   84 / minute BP sitting:   114 / 77  (left arm) Cuff size:   regular  Vitals Entered By: Cooper Render (April 26, 2007 12:20 PM)               Chief Complaint:  l) ear pain and med refill.  History of Present Illness: Here due to pain in L ear, no nasonex nasal spray lately.  Now working at [employer].  NO fever/chills, having nasal congestion.  Had conjunctivitis, used   Tobredex--improved.       Review of Systems      See HPI   Physical Exam  General:     alert, well-developed, well-nourished, and well-hydrated.   Head:     normocephalic.   Eyes:     no injection.   Ears:     both TMS retracted with increased fluid bilat Nose:     boggy but without edema Mouth:     injected Lungs:     normal respiratory effort and normal breath sounds.   Neurologic:     alert & oriented X3 and gait normal.   Skin:     turgor normal and color normal.   Psych:     normally interactive.      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 Take 1 capsule by mouth once a day 2)  Valtrex 1 Gm Tabs (Valacyclovir hcl) .... Take 1 tablet by mouth once a day   Patient Instructions: 1)  reviewed plan with him--agrees 2)  Refilled chronic meds due to change of insurance co with job change    Prescriptions: VALTREX 1 GM  TABS (VALACYCLOVIR HCL) Take 1 tablet by mouth once a day  #90 x 3   Entered and Authorized by:   Gildardo Griffes FNP   Signed by:    Gildardo Griffes FNP on 04/26/2007   Method used:   Print then Give to Patient   RxID:   [id number] ZEGERID   CAPS (OMEPRAZOLE-SODIUM BICARBONATE CAPS) Take 1 capsule by mouth once a day  #90 x 3   Entered and Authorized by:   Gildardo Griffes FNP   Signed by:   Gildardo Griffes FNP on 04/26/2007   Method used:   Print then Give to Patient   RxID:   [id number] NASAL SPRAY 2 sprays each nostril once daily prn Brand medically necessary #1 x 1   Entered and Authorized by:   Gildardo Griffes FNP   Signed by:   Gildardo Griffes FNP on 04/26/2007   Method used:   Print then Give to Patient   RxID:   [id number] HKVQQVZ NASAL SPRAY 2 sprays each nostril once daily prn Brand medically necessary #3 x 3   Entered and Authorized by:   Gildardo Griffes FNP   Signed by:   Gildardo Griffes FNP on 04/26/2007   Method used:  Print then Give to Patient   RxID:   [id number]

## 2010-12-02 NOTE — Assessment & Plan Note (Signed)
Summary: FEVER,ST,DIZZY/CLE  Medications Added ALLEGRA 180 MG  TABS (FEXOFENADINE HCL) 1 once daily as needed ZITHROMAX Z-PAK 250 MG  TABS (AZITHROMYCIN) use as directed      Allergies Added: NKDA  Vital Signs:  Patient Profile:   30 Years Old Male Weight:      166 pounds Temp:     98.8 degrees F oral Pulse rate:   76 / minute Pulse rhythm:   regular BP sitting:   112 / 72  (left arm) Cuff size:   regular  Vitals Entered By: Sydell Axon (September 08, 2007 12:14 PM)                 Chief Complaint:  Sore throat, dizzy, and ears feel full.  History of Present Illness: Here withST, ears hurt, fever--101.7 this AM.  Taking IBP, nasonex nasal spray.  No work today.  Current Allergies (reviewed today): No known allergies      Review of Systems      See HPI   Physical Exam  General:     alert, well-developed, and well-nourished.  NAD Head:     normocephalic, atraumatic, and no abnormalities observed.   Eyes:     pupils equal, pupils round, and no injection.   Ears:     TMs retracted with increased fluid Nose:     cleat Mouth:     injected with enlarged tonsils, no exudate Lungs:     normal respiratory effort, no intercostal retractions, and no accessory muscle use.   Neurologic:     alert & oriented X3 and gait normal.   Skin:     turgor normal, color normal, and no rashes.   Cervical Nodes:     1 cm bilat--tender Psych:     normally interactive.      Impression & Recommendations:  Problem # 1:  PHARYNGITIS-ACUTE (ICD-462) Assessment: New due to his enlarged nodes, enlarged tonsils, edspite neg rapid strep will cover withABT. gargle frequently increase by mouth fluids IBP as needed see back 3d if not improved. His updated medication list for this problem includes:    Zithromax Z-pak 250 Mg Tabs (Azithromycin) ..... Use as directed  Orders: Rapid Strep ([address])   Complete Medication List: 1)  Allegra 180 Mg Tabs (Fexofenadine hcl) .Marland Kitchen.. 1  once daily as needed 2)  Nasonex Nasal Spray  .Marland Kitchen.. 2 sprays each nostril once daily prn 3)  Zegerid Caps (Omeprazole-sodium bicarbonate caps) .... Take 1 capsule by mouth once a day 4)  Valtrex 1 Gm Tabs (Valacyclovir hcl) .... Take 1 tablet by mouth once a day 5)  Zithromax Z-pak 250 Mg Tabs (Azithromycin) .... Use as directed     Prescriptions: ZITHROMAX Z-PAK 250 MG  TABS (AZITHROMYCIN) use as directed  #1 x 0   Entered and Authorized by:   Gildardo Griffes FNP   Signed by:   Gildardo Griffes FNP on 09/08/2007   Method used:   Print then Give to Patient   RxID:   [id number]  ] Current Allergies (reviewed today): No known allergies   Laboratory Results  Date/Time Received: September 08, 2007 12:16 PM  Date/Time Reported: September 08, 2007 12:16 PM   Other Tests  Rapid Strep: negative

## 2010-12-02 NOTE — Progress Notes (Signed)
Summary: refill request for valtrex  Phone Note Refill Request Message from:  Fax from Pharmacy  Refills Requested: Medication #1:  VALTREX 1 GM  TABS Take 1 tablet by mouth once a day   Last Refilled: 12/27/2009 Faxed request from [facility] outpatient pharmacy- request is for a 90 day supply.  Initial call taken by: Lowella Petties CMA,  June 26, 2010 11:19 AM  Follow-up for Phone Call        Rx sent to pharmacy electronically. Follow-up by: Linde Gillis CMA Duncan Dull),  June 26, 2010 12:48 PM    New/Updated Medications: VALTREX 1 GM  TABS (VALACYCLOVIR HCL) Take 1 tablet by mouth once a day Prescriptions: VALTREX 1 GM  TABS (VALACYCLOVIR HCL) Take 1 tablet by mouth once a day  #90 x 0   Entered by:   Linde Gillis CMA (AAMA)   Authorized by:   Judith Part MD   Signed by:   Linde Gillis CMA Duncan Dull) on 06/26/2010   Method used:   Electronically to        [facility]* (retail)       [address]       [address] [place] [address]mail[address]       [place], [place]  [address]       Ph: [phone]       Fax: [phone]   RxID:   [id number]

## 2010-12-02 NOTE — Progress Notes (Signed)
Summary: Generic substitution for Valtrex request  Phone Note From Pharmacy Call back at fax [phone]   Caller: [facility] Call For: Everrett Coombe, FNP  Summary of Call: [facility] faxed a generic substitution request for Valacyclovir HCL 1000mg . The request is in your in box in your office. Thank you. Initial call taken by: Lewanda Rife LPN,  September 02, 2009 8:09 AM  Follow-up for Phone Call        fax completed  Billie-Lynn Tyler Deis FNP  September 03, 2009 6:58 AM   Completed form faxed to [phone]. Follow-up by: Lewanda Rife LPN,  September 03, 2009 8:33 AM    Prescriptions: VALTREX 1 GM  TABS (VALACYCLOVIR HCL) Take 1 tablet by mouth once a day  #90 Each x 3   Entered and Authorized by:   Gildardo Griffes FNP   Signed by:   Gildardo Griffes FNP on 09/03/2009   Method used:   Telephoned to ...       [facility]* (retail)       [address]       [address]/mail[address]       [place], [place]  [address]       Ph: [phone]       Fax: [phone]   RxID:   [id number]

## 2010-12-02 NOTE — Progress Notes (Signed)
Summary: refill request for adderall  Phone Note Refill Request Call back at [phone] Message from:  Patient  Refills Requested: Medication #1:  ADDERALL XR 25 MG XR24H-CAP 1 tab by mouth qam. Please call when ready.  Initial call taken by: Lowella Petties CMA,  January 23, 2010 4:07 PM    Prescriptions: ADDERALL XR 25 MG XR24H-CAP (AMPHETAMINE-DEXTROAMPHETAMINE) 1 tab by mouth qam  #30 x 0   Entered and Authorized by:   Ruthe Mannan MD   Signed by:   Ruthe Mannan MD on 01/23/2010   Method used:   Print then Give to Patient   RxID:   [id number]   Appended Document: refill request for adderall Patient Advised. Prescription left at front desk.

## 2010-12-02 NOTE — Progress Notes (Signed)
Summary: Rx- Zegerid  Phone Note Refill Request Message from:  Fax from Pharmacy on December 10, 2008 10:12 AM  In absence of Billie Bean. Not on med list Zegerid 40-1100 take one capsule by mouth at bedtime #30.  [facility] [address] [id number] Silas [place]  December 10, 2008 10:14 AM    Follow-up for Phone Call        OK, #30, 5 refills Follow-up by: Hannah Beat MD,  December 10, 2008 11:01 AM  Additional Follow-up for Phone Call Additional follow up Details #1::        med sent in electronically. Silas [place]  December 10, 2008 11:36 AM     New/Updated Medications: ZEGERID 40-1100 MG CAPS (OMEPRAZOLE-SODIUM BICARBONATE) take one capsule by mouth at bedtime   Prescriptions: ZEGERID 40-1100 MG CAPS (OMEPRAZOLE-SODIUM BICARBONATE) take one capsule by mouth at bedtime  #30 x 5   Entered by:   Silas [place]   Authorized by:   Hannah Beat MD   Signed by:   Silas [place] on 12/10/2008   Method used:   Electronically to        [facility] [address] (retail)       [address]       [place], [place]  [address]       Ph: [phone]       Fax: [phone]   RxID:   [id number]   Prior Medications (reviewed today): ALLEGRA 180 MG  TABS (FEXOFENADINE HCL) 1 once daily as needed NASONEX 50 MCG/ACT  SUSP (MOMETASONE FUROATE) 2 sprays each nostril once daily as needed VALTREX 1 GM  TABS (VALACYCLOVIR HCL) Take 1 tablet by mouth once a day VENTOLIN HFA 108 (90 BASE) MCG/ACT  AERS (ALBUTEROL SULFATE) 2 puffs q4h as needed wheezing NEXIUM 40 MG  CPDR (ESOMEPRAZOLE MAGNESIUM) 1 each morning 30 min prior to bkft or other food ROZEREM 8 MG  TABS (RAMELTEON) 1 tab po 30 min before bedtime as needed BACTRIM DS 800-160 MG  TABS (SULFAMETHOXAZOLE-TRIMETHOPRIM) 2 tab by mouth two times a day x 10days Current Allergies (reviewed today): No known allergies

## 2010-12-02 NOTE — Assessment & Plan Note (Signed)
Summary: 2:15 CUT ON LOWER R LEG/CLE   Vital Signs:  Patient Profile:   30 Years Old Male Weight:      178.50 pounds Temp:     97.8 degrees F oral Pulse rate:   84 / minute Pulse rhythm:   regular BP sitting:   108 / 82  (left arm) Cuff size:   regular  Vitals Entered By: Delilah Shan (June 11, 2008 2:02 PM)                 Chief Complaint:  Cut on lower right leg.  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  CV      Denies chest pain or discomfort.  Resp      Denies shortness of breath.  GI      Denies abdominal pain.  GU      Denies dysuria.  MS      Denies joint pain.  Psych      Denies anxiety and depression.      insomnia, he requests refill of sleep medication   Physical Exam  General:     Well-developed,well-nourished,in no acute distress; alert,appropriate and cooperative throughout examination Lungs:     Normal respiratory effort, chest expands symmetrically. Lungs are clear to auscultation, no crackles or wheezes. Heart:     Normal rate and regular rhythm. S1 and S2 normal without gallop, murmur, click, rub or other extra sounds. Msk:     right lower leg 6 cm superficial abraision with granulation tissue, with minimal surrounding erythema but erythema and swelling at  anterior leg and over lateral malleolus full ROM of righjt ankle joint Pulses:     R and L posterior tibial pulses are full and equal bilaterally  Neurologic:     strength normal in all extremities and sensation intact to light touch.      Impression & Recommendations:  Problem # 1:  CELLULITIS (ICD-682.9) Treat with antibiotic to cover MRSa given occupation. Follow up if redness swelling or joint pain.  His updated medication list for this problem includes:    Bactrim Ds 800-160 Mg Tabs (Sulfamethoxazole-trimethoprim) .Marland Kitchen... 2 tab by mouth two times a day x 10days   Problem # 2:  INSOMNIA (ICD-780.52) Reviewed sleep hygeine. Refilled Rx for rozerum.  His updated medication list for this problem includes:    Rozerem 8 Mg Tabs (Ramelteon) .Marland Kitchen... 1 tab po 30 min before bedtime as needed   Complete Medication List: 1)  Allegra 180 Mg Tabs (Fexofenadine hcl) .Marland Kitchen.. 1 once daily as needed 2)  Nasonex 50 Mcg/act Susp (Mometasone furoate) .... 2 sprays  each nostril once daily as needed 3)  Valtrex 1 Gm Tabs (Valacyclovir hcl) .... Take 1 tablet by mouth once a day 4)  Ventolin Hfa 108 (90 Base) Mcg/act Aers (Albuterol sulfate) .... 2 puffs q4h as needed wheezing 5)  Nexium 40 Mg Cpdr (Esomeprazole magnesium) .Marland Kitchen.. 1 each morning 30 min prior to bkft or other food 6)  Rozerem 8 Mg Tabs (Ramelteon) .Marland Kitchen.. 1 tab po 30 min before bedtime as needed 7)  Bactrim Ds 800-160 Mg Tabs (Sulfamethoxazole-trimethoprim) .... 2 tab by mouth two times a day x 10days    Prescriptions: ROZEREM 8 MG  TABS (RAMELTEON) 1 tab po 30 min before bedtime as needed  #30 x 3   Entered and Authorized by:   Kerby Nora MD   Signed by:   Kerby Nora MD on 06/11/2008   Method used:   Print then Give to Patient   RxID:   [id number] ROZEREM 8 MG  TABS (RAMELTEON) 1 tab po 30 min before bedtime as needed  #30 x 2   Entered and Authorized by:   Kerby Nora MD   Signed by:   Kerby Nora MD on 06/11/2008   Method used:    Electronically sent to ...       [facility] [address]       [address]       [place], [place]  [address]       Ph: [phone]       Fax: [phone]   RxID:   [id number] BACTRIM DS 800-160 MG  TABS (SULFAMETHOXAZOLE-TRIMETHOPRIM) 2 tab by mouth two times a day x 10days  #40 x 0   Entered and Authorized by:   Kerby Nora MD   Signed by:   Kerby Nora MD on 06/11/2008   Method used:   Electronically sent to ...       [facility] [address]       [address], [address]       [place], [place]  [address]       Ph: [phone]       Fax: [phone]   RxID:   [id number]  ] Current Allergies (reviewed today): No known allergies  Current Medications (including changes made in today's visit):  ALLEGRA 180 MG  TABS (FEXOFENADINE HCL) 1 once daily as needed NASONEX 50 MCG/ACT  SUSP (MOMETASONE FUROATE) 2 sprays each nostril once daily as needed VALTREX 1 GM  TABS (VALACYCLOVIR HCL) Take 1 tablet by mouth once a day VENTOLIN HFA 108 (90 BASE) MCG/ACT  AERS (ALBUTEROL SULFATE) 2 puffs q4h as needed wheezing [BMN] NEXIUM 40 MG  CPDR (ESOMEPRAZOLE MAGNESIUM) 1 each morning 30 min prior to bkft or other food ROZEREM 8 MG  TABS (RAMELTEON) 1 tab po 30 min before bedtime as needed BACTRIM DS 800-160 MG  TABS (SULFAMETHOXAZOLE-TRIMETHOPRIM) 2 tab by mouth two times a day x 10days

## 2010-12-02 NOTE — Progress Notes (Signed)
Summary: ADDERALL XR 20 MG XR24H-CAP  Phone Note Refill Request Call back at [phone] Message from:  Patient  Refills Requested: Medication #1:  ADDERALL XR 20 MG XR24H-CAP take 1 each morning for ADHD. call patient when ready  Initial call taken by: Melody Comas,  September 16, 2009 4:42 PM  Follow-up for Phone Call        Rx complete  Billie-Lynn Tyler Deis FNP  September 17, 2009 4:52 PM   Patient notified as instructed by telephone. Prescription left at front desk.  Follow-up by: Lewanda Rife LPN,  September 18, 2009 8:41 AM    Prescriptions: ADDERALL XR 20 MG XR24H-CAP (AMPHETAMINE-DEXTROAMPHETAMINE) take 1 each morning for ADHD  #30 x 0   Entered and Authorized by:   Gildardo Griffes FNP   Signed by:   Gildardo Griffes FNP on 09/17/2009   Method used:   Print then Give to Patient   RxID:   [id number]

## 2010-12-02 NOTE — Progress Notes (Signed)
Summary: Refill Albuterol  Medications Added VENTOLIN HFA 108 (90 BASE) MCG/ACT  AERS (ALBUTEROL SULFATE) 2 puffs q4h as needed wheezing [BMN]       Phone Note Call from Patient Call back at [phone]   Caller: Patient Call For: Everrett Coombe, FNP Summary of Call: Pt's Albuterol rx has expired and would like new rx called to [facility]  Chart is in your in box. Initial call taken by: Sydell Axon,  December 27, 2007 11:05 AM    New/Updated Medications: VENTOLIN HFA 108 (90 BASE) MCG/ACT  AERS (ALBUTEROL SULFATE) 2 puffs q4h as needed wheezing [BMN]   Prescriptions: VENTOLIN HFA 108 (90 BASE) MCG/ACT  AERS (ALBUTEROL SULFATE) 2 puffs q4h as needed wheezing Brand medically necessary #1 x 1   Entered by:   Cooper Render   Authorized by:   Gildardo Griffes FNP   Signed by:   Cooper Render on 12/27/2007   Method used:   Electronically sent to ...       [facility] [address]       [address]       [place], [place]  [address]       Ph: [phone]       Fax: [phone]   RxID:   [id number] VENTOLIN HFA 108 (90 BASE) MCG/ACT  AERS (ALBUTEROL SULFATE) 2 puffs q4h as needed wheezing Brand medically necessary #1 x 1   Entered and Authorized by:   Gildardo Griffes FNP   Signed by:   Gildardo Griffes FNP on 12/27/2007   Method used:   Telephoned to ...         RxID:   [id number]

## 2010-12-02 NOTE — Progress Notes (Signed)
Summary: refill request for adderall  Phone Note Refill Request Call back at [phone] Message from:  Patient  Refills Requested: Medication #1:  ADDERALL XR 25 MG XR24H-CAP 1 tab by mouth qam. Please call pt when ready.  Initial call taken by: Lowella Petties CMA,  Mar 05, 2010 4:16 PM    Prescriptions: ADDERALL XR 25 MG XR24H-CAP (AMPHETAMINE-DEXTROAMPHETAMINE) 1 tab by mouth qam  #30 x 0   Entered and Authorized by:   Ruthe Mannan MD   Signed by:   Ruthe Mannan MD on 03/05/2010   Method used:   Print then Give to Patient   RxID:   [id number]

## 2010-12-02 NOTE — Assessment & Plan Note (Signed)
Summary: COUGH/RBH   Vital Signs:  Patient profile:   30 year old male Height:      67.75 inches Weight:      162 pounds BMI:     24.90 Temp:     98.0 degrees F oral Pulse rate:   80 / minute Pulse rhythm:   regular BP sitting:   120 / 90  (left arm) Cuff size:   regular  Vitals Entered By: Linde Gillis CMA [facility]) (September 11, 2010 2:51 PM) CC: cough   History of Present Illness: 53 with h/o allergies and asthma here for persistent cough/wheeze x 3 weeks.  Started running outside when this started.  Takes Allegra daily.  Has not been taking his Nasonex regularly. His Albuterol dose help a little.  Has a little shortness of breath, no chest pain. Cough is mainly dry, sometimes clear sputum.  Current Medications (verified): 1)  Allegra 180 Mg  Tabs (Fexofenadine Hcl) .Marland Kitchen.. 1 Once Daily As Needed 2)  Nasonex 50 Mcg/act  Susp (Mometasone Furoate) .... 2 Sprays Each Nostril Once Daily As Needed 3)  Valtrex 1 Gm  Tabs (Valacyclovir Hcl) .... Take 1 Tablet By Mouth Once A Day 4)  Ventolin Hfa 108 (90 Base) Mcg/act  Aers (Albuterol Sulfate) .... 2 Puffs Every Four Hours As Needed Wheezing 5)  Rozerem 8 Mg  Tabs (Ramelteon) .Marland Kitchen.. 1 Tab Po 30 Min Before Bedtime As Needed 6)  Zegerid 20-1100 Mg Caps (Omeprazole-Sodium Bicarbonate) .... Otc One Capsule By Mouth At Bedtime 7)  Adderall Xr 25 Mg Xr24h-Cap (Amphetamine-Dextroamphetamine) .Marland Kitchen.. 1 Tab By Mouth Qam 8)  Flovent Hfa 44 Mcg/act Aero (Fluticasone Propionate  Hfa) .... 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)  Family History: Last updated: 04/05/2009 Father: elevated lipids Mother: ulcerative colitis Siblings: 0  PGM--cervix and breast cacer, died of lung ca--smoker PGF--bladder cancer MGM--scleraderma --MGF--died of brain tumor at age  9   DM- MI- CVA-  Prostate Cancer- Breast Cancer- PGM Ovarian Cancer- Uterine Cancer- Colon Cancer- Drug/ ETOH Abuse- Depression-  Social History: Last updated: 04/05/2009 Marital Status: Married Children: 1 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 ENT:  Complains of postnasal drainage. Resp:  Complains of cough, shortness of breath, and wheezing; denies sputum productive.  Physical Exam  General:  alert, well-developed, well-nourished, and well-hydrated.   Nose:  boggy turbinates Mouth:  +PND Lungs:  normal respiratory effort, no intercostal retractions, no accessory muscle use, and normal breath sounds.   Heart:  normal rate, regular rhythm, and no murmur.   Psych:  normally interactive, good eye contact, not anxious appearing, and not depressed appearing.     Impression & Recommendations:  Problem # 1:  ASTHMA (ICD-493.90) Assessment Deteriorated Will add inhaled steroid to current meds. If this does not help, will get CXR and PFTs Pt in agreement with plan. His updated medication list for this problem includes:    Ventolin Hfa 108 (90 Base) Mcg/act Aers (Albuterol sulfate) .Marland Kitchen... 2 puffs every four hours as needed wheezing    Flovent Hfa 44 Mcg/act Aero (Fluticasone propionate  hfa) .Marland Kitchen... 2 inh two times a day  Complete Medication  List: 1)  Allegra 180 Mg Tabs (Fexofenadine hcl) .Marland Kitchen.. 1 once daily as needed 2)  Nasonex 50 Mcg/act Susp (Mometasone furoate) .... 2 sprays each nostril once daily as needed 3)  Valtrex 1 Gm Tabs (Valacyclovir hcl) .... Take 1 tablet by mouth once a day 4)  Ventolin Hfa 108 (90 Base) Mcg/act Aers (Albuterol sulfate) .... 2 puffs every four hours as needed wheezing 5)  Rozerem 8 Mg Tabs (Ramelteon)  .Marland Kitchen.. 1 tab po 30 min before bedtime as needed 6)  Zegerid 20-1100 Mg Caps (Omeprazole-sodium bicarbonate) .... Otc one capsule by mouth at bedtime 7)  Adderall Xr 25 Mg Xr24h-cap (Amphetamine-dextroamphetamine) .Marland Kitchen.. 1 tab by mouth qam 8)  Flovent Hfa 44 Mcg/act Aero (Fluticasone propionate  hfa) .... 2 inh two times a day  Patient Instructions: 1)  Use the flovent as directed- 2 inhs twice daily. 2)  Call me in a few weeks to let me know how you're doing. 3)  You may need to restart the Nasonex as well. Prescriptions: FLOVENT HFA 44 MCG/ACT AERO (FLUTICASONE PROPIONATE  HFA) 2 inh two times a day  #1 x 0   Entered and Authorized by:   Ruthe Mannan MD   Signed by:   Ruthe Mannan MD on 09/11/2010   Method used:   Samples Given   RxID:   [id number]    Orders Added: 1)  Est. Patient Level IV [47425]    Current Allergies (reviewed today): No known allergies

## 2010-12-02 NOTE — Progress Notes (Signed)
Summary: adderall   Phone Note Refill Request Call back at [phone]   Refills Requested: Medication #1:  ADDERALL XR 25 MG XR24H-CAP 1 tab by mouth qam.  Method Requested: Pick up at Office Initial call taken by: Melody Comas,  June 11, 2010 4:31 PM    Prescriptions: ADDERALL XR 25 MG XR24H-CAP (AMPHETAMINE-DEXTROAMPHETAMINE) 1 tab by mouth qam  #30 x 0   Entered and Authorized by:   Ruthe Mannan MD   Signed by:   Ruthe Mannan MD on 06/12/2010   Method used:   Print then Give to Patient   RxID:   [id number]   Appended Document: adderall  Left message on cell phone voicemail, Rx ready for pick up will be left at front desk.

## 2010-12-02 NOTE — Medication Information (Signed)
Summary: [employer] Prior [employer] Prior Auth Approval   Imported By: Beau Fanny 12/14/2008 10:25:46  _____________________________________________________________________  External Attachment:    Type:   Image     Comment:   External Document

## 2010-12-02 NOTE — Progress Notes (Signed)
Summary: refill request for adderall  Phone Note Refill Request Call back at [phone] Message from:  Patient  Refills Requested: Medication #1:  ADDERALL XR 25 MG XR24H-CAP 1 tab by mouth qam. Phoned request from pt, please call when ready.  Initial call taken by: Lowella Petties CMA,  December 19, 2009 10:12 AM  Follow-up for Phone Call        On my desk. Follow-up by: Ruthe Mannan MD,  December 19, 2009 1:27 PM    Prescriptions: ADDERALL XR 25 MG XR24H-CAP (AMPHETAMINE-DEXTROAMPHETAMINE) 1 tab by mouth qam  #30 x 0   Entered and Authorized by:   Ruthe Mannan MD   Signed by:   Ruthe Mannan MD on 12/19/2009   Method used:   Print then Give to Patient   RxID:   [id number]   Appended Document: refill request for adderall Left message on voicemail  of contact number.  Prescription left at front desk.

## 2010-12-04 NOTE — Progress Notes (Signed)
Summary: Changing Valtrex to Valacyclovir  Phone Note From Pharmacy Call back at [phone]   Caller: [facility] Call For: Dr. Dayton Martes  Summary of Call: Received fax from [facility] requesting that Valtrex be changed to Valacyclovir to save patient money.  Form in your IN box. Initial call taken by: Linde Gillis CMA [facility]),  October 20, 2010 2:21 PM  Follow-up for Phone Call        Can you please give to another provider to sign since I won't be back until mid week next week? thanks, Talia. Ruthe Mannan MD  October 21, 2010 7:01 AM  Form given to Dr. Alphonsus Sias.  Linde Gillis CMA [facility])  October 21, 2010 8:09 AM    Okay to change to generic Cindee Salt MD  October 21, 2010 8:45 AM   chart updated and form faxed back to [facility] Follow-up by: Mervin Hack CMA [facility]),  October 21, 2010 8:54 AM    New/Updated Medications: VALACYCLOVIR HCL 1 GM TABS (VALACYCLOVIR HCL) take 1 by mouth once daily Prescriptions: VALACYCLOVIR HCL 1 GM TABS (VALACYCLOVIR HCL) take 1 by mouth once daily  #90 x 3   Entered by:   Mervin Hack CMA (AAMA)   Authorized by:   Cindee Salt MD   Signed by:   Mervin Hack CMA (AAMA) on 10/21/2010   Method used:   Handwritten   RxID:   [id number]

## 2010-12-04 NOTE — Progress Notes (Signed)
Summary: refill request for adderall  Phone Note Refill Request Message from:  Patient  Refills Requested: Medication #1:  ADDERALL XR 25 MG XR24H-CAP 1 tab by mouth qam Please call pt when ready.  Initial call taken by: Lowella Petties CMA, AAMA,  November 10, 2010 12:26 PM  Follow-up for Phone Call        Patient notified via telephone, Rx left up front for pick up. Follow-up by: Linde Gillis CMA [facility]),  November 10, 2010 2:53 PM    Prescriptions: ADDERALL XR 25 MG XR24H-CAP (AMPHETAMINE-DEXTROAMPHETAMINE) 1 tab by mouth qam  #30 x 0   Entered and Authorized by:   Ruthe Mannan MD   Signed by:   Ruthe Mannan MD on 11/10/2010   Method used:   Print then Give to Patient   RxID:   [id number]

## 2010-12-09 ENCOUNTER — Telehealth: Payer: Self-pay | Admitting: Family Medicine

## 2010-12-18 NOTE — Progress Notes (Signed)
Summary: refill request for adderall  Phone Note Refill Request Call back at [phone] Message from:  Patient  Refills Requested: Medication #1:  ADDERALL XR 25 MG XR24H-CAP 1 tab by mouth qam Please call pt when ready.  Initial call taken by: Lowella Petties CMA, AAMA,  December 09, 2010 9:50 AM    Prescriptions: ADDERALL XR 25 MG XR24H-CAP (AMPHETAMINE-DEXTROAMPHETAMINE) 1 tab by mouth qam  #30 x 0   Entered and Authorized by:   Ruthe Mannan MD   Signed by:   Ruthe Mannan MD on 12/09/2010   Method used:   Print then Give to Patient   RxID:   [id number]   Appended Document: refill request for adderall Message left on cell phone voicemail advising patient that Rx is ready for pick up will be left at front desk.

## 2011-01-09 ENCOUNTER — Telehealth: Payer: Self-pay | Admitting: Family Medicine

## 2011-01-13 NOTE — Progress Notes (Signed)
Summary: refill request for adderall  Phone Note Refill Request Call back at [phone] Message from:  Patient  Refills Requested: Medication #1:  ADDERALL XR 25 MG XR24H-CAP 1 tab by mouth qam Please call pt when ready.  Initial call taken by: Lowella Petties CMA, AAMA,  January 09, 2011 12:04 PM  Follow-up for Phone Call        Patient advised as instructed Rx ready for pick up, will be left at front desk. Follow-up by: Linde Gillis CMA [facility]),  January 09, 2011 1:52 PM    Prescriptions: ADDERALL XR 25 MG XR24H-CAP (AMPHETAMINE-DEXTROAMPHETAMINE) 1 tab by mouth qam  #30 x 0   Entered and Authorized by:   Ruthe Mannan MD   Signed by:   Ruthe Mannan MD on 01/09/2011   Method used:   Print then Give to Patient   RxID:   [id number]

## 2011-02-09 LAB — POCT I-STAT, CHEM 8
BUN: 10 mg/dL (ref 6–23)
Calcium, Ion: 1.14 mmol/L (ref 1.12–1.32)
Chloride: 101 mEq/L (ref 96–112)
Creatinine, Ser: 1 mg/dL (ref 0.4–1.5)
Glucose, Bld: 94 mg/dL (ref 70–99)
HCT: 49 % (ref 39.0–52.0)
Hemoglobin: 16.7 g/dL (ref 13.0–17.0)
Potassium: 4.1 mEq/L (ref 3.5–5.1)
Sodium: 136 mEq/L (ref 135–145)
TCO2: 25 mmol/L (ref 0–100)

## 2011-02-10 ENCOUNTER — Other Ambulatory Visit: Payer: Self-pay | Admitting: *Deleted

## 2011-02-10 MED ORDER — AMPHETAMINE-DEXTROAMPHET ER 25 MG PO CP24: 25.0000 mg | ORAL_CAPSULE | ORAL | Status: DC

## 2011-02-10 NOTE — Telephone Encounter (Signed)
Patient notified as instructed via telephone.  Rx ready for pick up will be left at front desk.

## 2011-03-17 NOTE — Assessment & Plan Note (Signed)
[facility]                                 ON-CALL NOTE   Bradley Schaefer, Bradley Schaefer                         MRN:          [id number]  DATE:04/16/2007                            DOB:          23-Dec-1980    DATE OF BIRTH:  [birth year].   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   OBJECTIVE:  Conjunctivitis, possibly viral, possibly bacterial.   PLAN:  He was told that he really needs to be seen in order to have  medicine given for his eyes which are extremely important. He will go to  urgent care if he feels that he needs to.   PRIMARY CARE PHYSICIAN:  Billie [facility].   HOME OFFICE:  [address].     Arta Silence, MD  Electronically Signed    RNS/MedQ  DD: 04/16/2007  DT: 04/17/2007  Job #: [id number]

## 2011-03-19 ENCOUNTER — Other Ambulatory Visit: Payer: Self-pay | Admitting: *Deleted

## 2011-03-19 MED ORDER — AMPHETAMINE-DEXTROAMPHET ER 25 MG PO CP24: 25.0000 mg | ORAL_CAPSULE | ORAL | Status: DC

## 2011-03-19 NOTE — Telephone Encounter (Signed)
Left message on cell phone voicemail, Rx ready for pick up will be left at front desk. 

## 2011-03-20 NOTE — Assessment & Plan Note (Signed)
[facility]                             PULMONARY OFFICE NOTE   [name], [name]                         MRN:          [id number]  DATE:01/25/2007                            DOB:          26-Feb-1981    HISTORY:  A 30 year old white male with chronic asthma that typically is  exercise exacerbated, associated with atypical features that I thought  might be GERD related, and in fact, now that he is taking Zegerid  consistently, he is no longer feeling that he needs any of his asthma  medicines.  He stopped all of them several weeks ago, and has had no  difficulty at all with any symptoms, either day, nighttime, related to  exercise, or the need for albuterol.   His main complaint, and the reason for his visit today, is that he  cannot get the Zegerid filled because of insurance lapse.  He denies any  dysphagia, overt reflux, or sinus symptoms.   PHYSICAL EXAMINATION:  He is a pleasant ambulatory white male, in no  acute distress.  He had stable vital signs.  HEENT:  Unremarkable.  Oropharynx clear.  LUNG FIELDS:  Perfectly clear bilaterally to auscultation and  percussion.  HEART:  Regular rhythm without murmur, gallop, or rub.  ABDOMEN:  Soft and benign.  EXTREMITIES:  Warm without calf tenderness, cyanosis, or clubbing.   IMPRESSION:  Although I am delighted that his asthma is under such good  control with monotherapy with Zegerid, this presents several issues:  1. It must indicate significant underlying gastroesophageal reflux      disease for which at some point he needs upper endoscopy rather      than simply refilling his proton pump inhibitors, especially given      his age and the risk of Barrett's and stenosis developing long      term.  I did give him a limited refill with samples today, but will      defer further refills and/or referral to GI to Billie Bean.  2. The asthma issue is a major one, in that there was major discussion  today in the context of his previous history of symptoms with      exercise, especially given the chronicity of the problem.  If, on      the other hand, he has no symptoms at all now with treatment      directed at gastroesophageal reflux disease, and no need for rescue      therapy beyond 2 puffs a week, then I think it is reasonable to      continue off maintenance therapy.  Otherwise, I would start him      back on Qvar 40 two puffs b.i.d. as maintenance therapy.  I      emphasized this point repeatedly to make sure the patient      understood it is not adequate just to cover up symptoms, and given      all the data regarding exacerbation and longterm lung health      related to poor control of asthma, if indeed  he breaks the rule of      2s in terms of symptoms or      rescue therapy.  3. Pulmonary followup, however, can be p.r.n. in this setting.     Charlaine Dalton. Sherene Sires, MD, [facility]  Electronically Signed    MBW/MedQ  DD: 01/25/2007  DT: 01/25/2007  Job #: [id number]   cc:   Billie D. Bean, FNP  Arta Silence, MD

## 2011-03-20 NOTE — Assessment & Plan Note (Signed)
[facility]                             PULMONARY OFFICE NOTE   Bradley Schaefer, Bradley Schaefer                         MRN:          [id number]  DATE:12/24/2006                            DOB:          1981/04/07    CHIEF COMPLAINT:  Asthma.   HISTORY:  This is a 30 year old white male who reports that he had  asthma as a child an seemed to out grow it in high school.  However,  he noticed, later, when he would try to play hockey that he would become  short of breath immediately after exercising.  He said Singulair seemed  to work but did not notice in any decrease in albuterol use while  taking Singulair.  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  His main symptoms have consisted of a  harsh barking cough which is different from a chest tightness and  dyspnea that he experiences when he plays hockey.  He denies any  significant nocturnal complaints of fevers, chills, sweats, orthopnea,  PND, overt sinus or reflux symptoms.   PAST MEDICAL HISTORY:  Significant for allergies due to asthma.   ALLERGIES:  None known.   MEDICATIONS:  1. Singulair.  2. Allegra.  3. Symbicort at 80/4.5 two puffs b.i.d. with excellent MDI technique.   SOCIAL HISTORY:  He drinks an occasional beer, but does not smoke.  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   FAMILY HISTORY:  Recorded in detail on the work sheets, again, revealing  the absence of HV or asthma.   REVIEW OF SYSTEMS:  See detail on the work sheets.  Additional complaint  of chronic nasal congestion; but no present sneezing.   OFFICE EXAMINATION:  This is a pleasant ambulatory white male in no  acute distress.  Stable vital signs.  HEENT:  Reveals mild turbinate edema.  Oral pharynx is clear.  NECK:  Supple without adenopathy or tenderness.  Trachea is midline.  No  thyromegaly.  LUNGS:  Lung  fields perfectly bilaterally to auscultation and  percussion, but he does have a barking-quality upper airway cough  present.  There is regular rate and rhythm without murmur, gallop, or  rub.  ABDOMEN:  Soft benign.  EXTREMITIES:  There is no cyanosis, clubbing or edema.   No recent x-rays were available.   IMPRESSION:  This patient clearly has rhinitis with asthma, but I am not  sure that either of these problems explain his present cough, which may  be largely related to reflux.  Note that the symptoms are not identical  to what he has when he provokes exercise induced asthma with playing  hockey.  Walking from a cold ice rink into a warm gymnasium is a perfect  trigger; and he has the classic history that he gets worse after he  actually stops skating, indicating that rewarming is a major mechanism  driving his airways instability, but I gain, emphasize to him that this  may have nothing  to do with his present symptoms, which are mostly that  of cough without chest tightness and dyspnea.   To test this hypothesis, I recommended switching to Qvar 40 two puffs  b.i.d. since it has the best track record in terms of penetration of the  lower airway and the least irritation of the upper airway.  I made sure  that he could use this effectively (MDI technique is actually quite  good).  Since Singulair has not reached the goal of eliminating his  symptoms, I think, that it is worth stopping it, just to simplify his  care and perhaps improve compliance. Though Symbicort was a good choice  for him, it has not reach the goal either and I have asked him to  eliminate it for now.  I have emphasized the importance of continuing on  the Nasonex 1 puff b.i.d. perfectly regular, follow an acid reflux diet,  and thinking about his medicines in terms of the p.r.n.'s focused on  symptoms.  That is, if he has obstructed sinuses, the best way to treat  that is with Afrin for 5 days before Nasonex.  If  he has any sneezing or  runny nose the best way to treat that is Allegra 180 daily.  For cough  Delsym 2 teaspoons q.12 h. and if still coughing, Tramadol 50 mg q.4 h.  only for wheezing, tightness and dyspnea like he has been experiencing  in the hockey rink should he be using albuterol at all, at this point,  because I do not believe that it is effective.  I believe that the cough  can be managed more effectively with cough suppressants and eliminate  the cycle of coughing that may be contributing to airways inflammation  via a cyclical mechanism, perhaps mediated by LPR/GERD.     Charlaine Dalton. Sherene Sires, MD, [facility]  Electronically Signed    MBW/MedQ  DD: 12/25/2006  DT: 12/25/2006  Job #: [id number]   cc:   Willaim Sheng D. Bean, FNP

## 2011-04-20 ENCOUNTER — Other Ambulatory Visit: Payer: Self-pay | Admitting: *Deleted

## 2011-04-20 MED ORDER — AMPHETAMINE-DEXTROAMPHET ER 25 MG PO CP24: 25.0000 mg | ORAL_CAPSULE | ORAL | Status: DC

## 2011-04-21 MED ORDER — AMPHETAMINE-DEXTROAMPHET ER 25 MG PO CP24: 25.0000 mg | ORAL_CAPSULE | ORAL | Status: DC

## 2011-04-21 NOTE — Telephone Encounter (Signed)
Patient notified Rx ready for pick up will be left at front desk. 

## 2011-05-07 ENCOUNTER — Other Ambulatory Visit: Payer: Self-pay | Admitting: Family Medicine

## 2011-05-18 ENCOUNTER — Other Ambulatory Visit: Payer: Self-pay | Admitting: *Deleted

## 2011-05-18 NOTE — Telephone Encounter (Signed)
Please call pt when ready.

## 2011-05-19 MED ORDER — AMPHETAMINE-DEXTROAMPHET ER 25 MG PO CP24: 25.0000 mg | ORAL_CAPSULE | ORAL | Status: DC

## 2011-05-19 NOTE — Telephone Encounter (Signed)
Message left on cell phone voicemail advising patient that Rx is ready for pick up will be left at front desk.

## 2011-06-19 ENCOUNTER — Other Ambulatory Visit: Payer: Self-pay | Admitting: *Deleted

## 2011-06-19 MED ORDER — AMPHETAMINE-DEXTROAMPHET ER 25 MG PO CP24: 25.0000 mg | ORAL_CAPSULE | ORAL | Status: DC

## 2011-06-19 NOTE — Telephone Encounter (Signed)
Patient advised rx ready for pick up 

## 2011-06-19 NOTE — Telephone Encounter (Signed)
Please advise patient by phone when Rx is ready for pick up.

## 2011-06-30 ENCOUNTER — Encounter: Payer: Self-pay | Admitting: Vascular Surgery

## 2011-07-01 ENCOUNTER — Encounter: Payer: Self-pay | Admitting: Vascular Surgery

## 2011-07-02 ENCOUNTER — Ambulatory Visit (INDEPENDENT_AMBULATORY_CARE_PROVIDER_SITE_OTHER): Payer: 59 | Admitting: Vascular Surgery

## 2011-07-02 ENCOUNTER — Encounter: Payer: Self-pay | Admitting: Vascular Surgery

## 2011-07-02 VITALS — BP 144/90 | HR 92 | Resp 16

## 2011-07-02 DIAGNOSIS — I83893 Varicose veins of bilateral lower extremities with other complications: Secondary | ICD-10-CM

## 2011-07-02 NOTE — Progress Notes (Signed)
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 He presents today complaining of varicose veins primarily in his left lower extremity. He has symptoms of aching and swelling after being on his feet all day. He also complains of itching around the varicosities at times. He has been wearing knee-high compression almost daily for a year with some relief. However his symptoms have persisted and are not completely relieved. He's had no prior venous procedures. He has no prior history of DVT. Family history significant for grandmother had varicose veins. He is very active with cycling and running and he has been limited in his activities secondary to his varicose veins.  Past Medical History  Diagnosis Date  . Asthma   . ADD (attention deficit disorder)   . Herpes simplex   . GERD (gastroesophageal reflux disease)   . Varicose veins     History reviewed. No pertinent past surgical history.  History   Social History  . Marital Status: Married    Spouse Name: N/A    Number of Children: N/A  . Years of Education: N/A   Occupational History  . Not on file.   Social History Main Topics  . Smoking status: Never Smoker   . Smokeless tobacco: Not on file  . Alcohol Use: Yes     occasionally  . Drug Use:   . Sexually Active:    Other Topics Concern  . Not on file   Social History Narrative  . No narrative on file    Current Outpatient Prescriptions on File Prior to Visit  Medication Sig Dispense Refill  . amphetamine-dextroamphetamine (ADDERALL XR, 25MG ,) 25 MG 24 hr capsule Take 1 capsule (25 mg total) by mouth every morning.  30 capsule  0  . Fexofenadine HCl (ALLEGRA PO) Take by mouth.        Maxwell Caul Bicarbonate (ZEGERID PO) Take by mouth.        Marland Kitchen VALTREX 1 G tablet TAKE 1 TABLET BY MOUTH ONCE A DAY  90 tablet  0  . budesonide-formoterol (SYMBICORT) 80-4.5 MCG/ACT inhaler Inhale 2 puffs into the lungs 2 (two) times daily.        . Montelukast  Sodium (SINGULAIR PO) Take by mouth.          No Known Allergies  Review of systems:  General: 5 foot 755 pounds  Pulmonary: Occasional asthma flareup  Vascular cardiac GI hematologic urinary skin psychiatric ENT and neurologic systems all negative  Physical exam: Blood pressure 144/90, pulse 92, resp. rate 16.  HEENT: Negative Neck: 2+ carotid pulses without bruit or JVD  Chest: Clear to auscultation bilaterally  Cardiac: Regular rate and rhythm without murmur  Abdomen: Soft nontender nondistended no mass  Extremities: 2+ femoral dorsalis pedis and posterior tibial pulses bilaterally, left leg 10 x 10 cm cluster of varicosities just below the knee these are all 3-4 mm in diameter, right leg less prominent varicosities but similar distribution, left anterior calf an additional cluster of varicosities 34 mm in diameter near the ankle, trace edema bilaterally  Skin: No ulcer or rash  Neuro: Symmetric upper extremity and lower extremity motor strength which is 5 over 5  Assessment: Symptomatic varicose veins left greater than right. The patient has been compliant with compression at this point.  Plan: We will switch from knee-high the thigh high compression stockings to see if he has any improvement. He will return for office visit with Dr. Arbie Cookey in 3 months time  at which time he will also have a duplex for competency and patency. We will also consider whether venous ablation or stripping might be in his best interest. Patient was counseled today in compliant with compression therapy for improvement in symptoms as well as use of nonsteroidal anti-inflammatories for flareups of pain or aching. He also elevate his legs at the end of the day as much as possible when he is been on his feet all day.

## 2011-07-20 ENCOUNTER — Other Ambulatory Visit: Payer: Self-pay | Admitting: *Deleted

## 2011-07-20 MED ORDER — AMPHETAMINE-DEXTROAMPHET ER 25 MG PO CP24: 25.0000 mg | ORAL_CAPSULE | ORAL | Status: DC

## 2011-07-20 NOTE — Telephone Encounter (Signed)
Left message on cell phone voicemail advising patient that Rx is ready for pick up will be left at front desk. 

## 2011-07-20 NOTE — Telephone Encounter (Signed)
Please call pt when ready.

## 2011-08-20 ENCOUNTER — Other Ambulatory Visit: Payer: Self-pay

## 2011-08-20 ENCOUNTER — Encounter: Payer: Self-pay | Admitting: Vascular Surgery

## 2011-08-24 ENCOUNTER — Other Ambulatory Visit: Payer: Self-pay | Admitting: *Deleted

## 2011-08-24 MED ORDER — AMPHETAMINE-DEXTROAMPHET ER 25 MG PO CP24: 25.0000 mg | ORAL_CAPSULE | ORAL | Status: DC

## 2011-08-24 NOTE — Telephone Encounter (Signed)
Left message on cell phone voicemail.  Rx ready for pick up will be left at front desk. 

## 2011-08-24 NOTE — Telephone Encounter (Signed)
Rx printed

## 2011-08-24 NOTE — Telephone Encounter (Signed)
Pt is requesting refill on his adderall , please call when ready.

## 2011-09-21 ENCOUNTER — Other Ambulatory Visit: Payer: Self-pay | Admitting: *Deleted

## 2011-09-21 MED ORDER — AMPHETAMINE-DEXTROAMPHET ER 25 MG PO CP24: 25.0000 mg | ORAL_CAPSULE | ORAL | Status: DC

## 2011-09-21 NOTE — Telephone Encounter (Signed)
Please call pt when ready.

## 2011-09-21 NOTE — Telephone Encounter (Signed)
Patient advised via telephone, Rx ready for pick up will be left at front desk. 

## 2011-10-05 ENCOUNTER — Emergency Department (HOSPITAL_COMMUNITY)
Admission: EM | Admit: 2011-10-05 | Discharge: 2011-10-05 | Disposition: A | Discharge status: Home / Self Care | Payer: 59 | Source: Home / Self Care

## 2011-10-05 ENCOUNTER — Encounter (HOSPITAL_COMMUNITY): Payer: Self-pay

## 2011-10-05 DIAGNOSIS — B9789 Other viral agents as the cause of diseases classified elsewhere: Secondary | ICD-10-CM

## 2011-10-05 DIAGNOSIS — B349 Viral infection, unspecified: Secondary | ICD-10-CM

## 2011-10-05 MED ORDER — PROMETHAZINE-CODEINE 6.25-10 MG/5ML PO SYRP: ORAL_SOLUTION | ORAL | Status: AC

## 2011-10-05 NOTE — ED Provider Notes (Signed)
History     CSN: [id number] Arrival date & time: 10/05/2011  7:35 PM   None     Chief Complaint  Patient presents with  . Fever    (Consider location/radiation/quality/duration/timing/severity/associated sxs/prior treatment) HPI Comments: Onset of cough 2 days ago - nonproductive. Last night began having fatigue, fever, and nasal congestion. Hx of asthma - no wheezing, or flare up. States asthma has been well controlled. 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   Patient is a 30 y.o. male presenting with fever. The history is provided by the patient.  Fever Primary symptoms of the febrile illness include fever, fatigue, cough and shortness of breath. Primary symptoms do not include wheezing, nausea, vomiting or diarrhea.  The fever began yesterday. The fever has been unchanged since its onset. The maximum temperature recorded prior to his arrival was 101 to 101.9 F.  The fatigue began yesterday. The fatigue has been unchanged since its onset.  The cough began 2 days ago. The cough is new. The cough is non-productive.  The shortness of breath began today. The shortness of breath is mild. The patient's medical history is significant for asthma.    Past Medical History  Diagnosis Date  . Asthma   . ADD (attention deficit disorder)   . Herpes simplex   . GERD (gastroesophageal reflux disease)   . Varicose veins     History reviewed. No pertinent past surgical history.  History reviewed. No pertinent family history.  History  Substance Use Topics  . Smoking status: Never Smoker   . Smokeless tobacco: Not on file  . Alcohol Use: Yes     occasionally      Review of Systems  Constitutional: Positive for fever and fatigue.  HENT: Positive for congestion, sore throat, rhinorrhea and postnasal drip. Negative for sinus pressure.   Respiratory: Positive for cough and shortness of breath. Negative for wheezing.     Gastrointestinal: Negative for nausea, vomiting and diarrhea.  Genitourinary: Negative for decreased urine volume.    Allergies  Review of patient's allergies indicates no known allergies.  Home Medications   Current Outpatient Rx  Name Route Sig Dispense Refill  . AMPHETAMINE-DEXTROAMPHET ER 25 MG PO CP24 Oral Take 1 capsule (25 mg total) by mouth every morning. 30 capsule 0  . BUDESONIDE-FORMOTEROL FUMARATE 80-4.5 MCG/ACT IN AERO Inhalation Inhale 2 puffs into the lungs 2 (two) times daily.      Joyce Copa PO Oral Take by mouth.      Marland Kitchen SINGULAIR PO Oral Take by mouth.      Marland Kitchen ZEGERID PO Oral Take by mouth.      Marland Kitchen VALTREX 1 G PO TABS  TAKE 1 TABLET BY MOUTH ONCE A DAY 90 tablet 0    BP 132/79  Pulse 72  Temp(Src) 99.3 F (37.4 C) (Oral)  Resp 16  SpO2 97%  Physical Exam  Constitutional: He appears well-developed and well-nourished. No distress.  HENT:  Head: Normocephalic and atraumatic.  Right Ear: Tympanic membrane, external ear and ear canal normal.  Left Ear: Tympanic membrane, external ear and ear canal normal.  Nose: Nose normal.  Mouth/Throat: Uvula is midline, oropharynx is clear and moist and mucous membranes are normal. No oropharyngeal exudate, posterior oropharyngeal edema or posterior oropharyngeal erythema.  Neck: Neck supple.  Cardiovascular: Normal rate, regular rhythm and normal heart sounds.   Pulmonary/Chest: Effort normal and breath sounds normal. No respiratory  distress.  Lymphadenopathy:    He has no cervical adenopathy.  Neurological: He is alert.  Skin: Skin is warm and dry.  Psychiatric: He has a normal mood and affect.    ED Course  Procedures (including critical care time)  Labs Reviewed - No data to display No results found.   No diagnosis found.    MDM          Melody Comas, PA 10/05/11 2028

## 2011-10-05 NOTE — ED Provider Notes (Signed)
Medical screening examination/treatment/procedure(s) were performed by non-physician practitioner and as supervising physician I was immediately available for consultation/collaboration.  Hillery Hunter, MD 10/05/11 [phone]

## 2011-10-05 NOTE — ED Notes (Signed)
C/o cough, congestion, fever; mostly clear yellow secretions; NAD

## 2011-10-06 ENCOUNTER — Ambulatory Visit: Payer: 59 | Admitting: Vascular Surgery

## 2011-10-06 ENCOUNTER — Other Ambulatory Visit: Payer: 59

## 2011-10-08 ENCOUNTER — Other Ambulatory Visit: Payer: Self-pay | Admitting: Family Medicine

## 2011-10-23 ENCOUNTER — Other Ambulatory Visit: Payer: Self-pay | Admitting: Internal Medicine

## 2011-10-23 MED ORDER — AMPHETAMINE-DEXTROAMPHET ER 25 MG PO CP24: 25.0000 mg | ORAL_CAPSULE | ORAL | Status: DC

## 2011-10-23 NOTE — Telephone Encounter (Signed)
Patient advised via telephone, Rx ready for pick up will be left at front desk. 

## 2011-10-23 NOTE — Telephone Encounter (Signed)
Last refill 09/21/2011. 

## 2011-10-23 NOTE — Telephone Encounter (Signed)
Patient requesting refill on Adderral

## 2011-10-23 NOTE — Telephone Encounter (Signed)
Please enter refill request so we can see last time it was order, etc.

## 2011-11-17 ENCOUNTER — Other Ambulatory Visit: Payer: 59

## 2011-11-17 ENCOUNTER — Ambulatory Visit: Payer: 59 | Admitting: Vascular Surgery

## 2011-11-25 ENCOUNTER — Other Ambulatory Visit: Payer: Self-pay | Admitting: *Deleted

## 2011-11-25 NOTE — Telephone Encounter (Signed)
Patient requested Rx refill for Adderall and he stated that he will need Prior Authorization.  He gave the phone number for the PA [phone].  Called and spoke to a customer service rep who will be faxing PA form to our office today.

## 2011-11-26 MED ORDER — AMPHETAMINE-DEXTROAMPHET ER 25 MG PO CP24: 25.0000 mg | ORAL_CAPSULE | ORAL | Status: DC

## 2011-11-26 NOTE — Telephone Encounter (Signed)
PA form faxed to [facility].  Rx printed and put in your IN box for signature.

## 2011-11-27 NOTE — Telephone Encounter (Signed)
Left message on cell phone voicemail advising patient that Rx is ready for pick up will be left at front desk. 

## 2011-12-14 ENCOUNTER — Encounter: Payer: Self-pay | Admitting: Vascular Surgery

## 2011-12-15 ENCOUNTER — Ambulatory Visit: Payer: 59 | Admitting: Vascular Surgery

## 2011-12-29 ENCOUNTER — Ambulatory Visit (INDEPENDENT_AMBULATORY_CARE_PROVIDER_SITE_OTHER): Payer: 59 | Admitting: Family Medicine

## 2011-12-29 ENCOUNTER — Encounter: Payer: Self-pay | Admitting: Family Medicine

## 2011-12-29 VITALS — BP 152/88 | HR 90 | Temp 98.3°F | Wt 161.0 lb

## 2011-12-29 DIAGNOSIS — Z136 Encounter for screening for cardiovascular disorders: Secondary | ICD-10-CM

## 2011-12-29 DIAGNOSIS — IMO0001 Reserved for inherently not codable concepts without codable children: Secondary | ICD-10-CM | POA: Insufficient documentation

## 2011-12-29 DIAGNOSIS — R030 Elevated blood-pressure reading, without diagnosis of hypertension: Secondary | ICD-10-CM

## 2011-12-29 DIAGNOSIS — F909 Attention-deficit hyperactivity disorder, unspecified type: Secondary | ICD-10-CM

## 2011-12-29 LAB — LIPID PANEL
Cholesterol: 173 mg/dL (ref 0–200)
HDL: 58.2 mg/dL (ref >39.00)
LDL Cholesterol: 96 mg/dL (ref 0–99)
Total CHOL/HDL Ratio: 3
Triglycerides: 96 mg/dL (ref 0.0–149.0)
VLDL: 19.2 mg/dL (ref 0.0–40.0)

## 2011-12-29 LAB — BASIC METABOLIC PANEL
BUN: 9 mg/dL (ref 6–23)
CO2: 30 mEq/L (ref 19–32)
Calcium: 9.5 mg/dL (ref 8.4–10.5)
Chloride: 101 mEq/L (ref 96–112)
Creatinine, Ser: 0.9 mg/dL (ref 0.4–1.5)
GFR: 109.15 mL/min (ref >60.00)
Glucose, Bld: 93 mg/dL (ref 70–99)
Potassium: 4.3 mEq/L (ref 3.5–5.1)
Sodium: 138 mEq/L (ref 135–145)

## 2011-12-29 MED ORDER — MOMETASONE FUROATE 50 MCG/ACT NA SUSP: 2.0000 | Freq: Every day | NASAL | Status: DC

## 2011-12-29 MED ORDER — AMPHETAMINE-DEXTROAMPHET ER 30 MG PO CP24: 30.0000 mg | ORAL_CAPSULE | ORAL | Status: DC

## 2011-12-29 NOTE — Progress Notes (Signed)
31 yo with h/o ADD here to discuss Adderall dose.  He wants to increase to dose. Feels after a few hours, gets distracted at work.  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  Has had no issues with insomina or decreased appetite.  No n/v/d.  Does not feel jittery.   His blood pressure is elevated today but has had readings at work that were in 120s/80s.  His pulse is also a little elevated- admits to being a little nervous. No FH of HTN. No CP or SOB.  Has been running several miles per week, has lost weight since last office visit.  Wt Readings from Last 3 Encounters:  12/29/11 161 lb (73.029 kg)  09/11/10 162 lb (73.483 kg)  11/19/09 175 lb (79.379 kg)     Patient Active Problem List  Diagnoses  . GENITAL HERPES  . ADHD  . EUSTACHIAN TUBE DYSFUNCTION, BILATERAL  . ASTHMA  . CELLULITIS  . INSOMNIA  . LUMBAR STRAIN, ACUTE  . ALLERGY  . ALLERGY TO INSECTS AND ARACHNIDS   Past Medical History  Diagnosis Date  . Asthma   . ADD (attention deficit disorder)   . Herpes simplex   . GERD (gastroesophageal reflux disease)   . Varicose veins    No past surgical history on file. History  Substance Use Topics  . Smoking status: Never Smoker   . Smokeless tobacco: Not on file  . Alcohol Use: Yes     occasionally   No family history on file. No Known Allergies Current Outpatient Prescriptions on File Prior to Visit  Medication Sig Dispense Refill  . amphetamine-dextroamphetamine (ADDERALL XR) 25 MG 24 hr capsule Take 1 capsule (25 mg total) by mouth every morning.  30 capsule  0  . budesonide-formoterol (SYMBICORT) 80-4.5 MCG/ACT inhaler Inhale 2 puffs into the lungs 2 (two) times daily.        Marland Kitchen Fexofenadine HCl (ALLEGRA PO) Take by mouth.        . Montelukast Sodium (SINGULAIR PO) Take by mouth.        Maxwell Caul Bicarbonate (ZEGERID PO) Take by mouth.        . valACYclovir (VALTREX) 1000 MG tablet TAKE 1 TABLET BY MOUTH ONCE A DAY  90 tablet   3   The PMH, PSH, Social History, Family History, Medications, and allergies have been reviewed in [facility], and have been updated if relevant.    Review of Systems  See HPI  General: Denies loss of appetite and weight loss.  Neuro: Denies headaches, memory loss, visual disturbances, and weakness.   Physical Exam BP 152/88  Pulse 90  Temp(Src) 98.3 F (36.8 C) (Oral)  Wt 161 lb (73.029 kg)   General: alert, well-developed, well-nourished, and well-hydrated.  Lungs: normal respiratory effort, no intercostal retractions, no accessory muscle use, and normal breath sounds.  Heart: mildly tachycardic, regular rhythm, and no murmur.  Psych: normally interactive, good eye contact, not anxious appearing, and not depressed appearing.   Assessment and Plan: 1. ADHD  Deteriorated. Discussed importance of checking BP at work- see pt instructions, especially with increased dose of adderall.  Will increase to 30 XR mg daily. The patient indicates understanding of these issues and agrees with the plan.    2. Elevated blood pressure  New- see above. Hopefully white coat HTN. If remains elevated at work, will start ACEI. Basic Metabolic Panel

## 2011-12-29 NOTE — Patient Instructions (Signed)
Please check your blood pressure three times a week for next 2 weeks and call me with an update.

## 2012-01-26 ENCOUNTER — Ambulatory Visit (INDEPENDENT_AMBULATORY_CARE_PROVIDER_SITE_OTHER): Payer: 59 | Admitting: Family Medicine

## 2012-01-26 ENCOUNTER — Encounter: Payer: Self-pay | Admitting: Family Medicine

## 2012-01-26 VITALS — BP 124/80 | HR 80 | Temp 98.2°F | Wt 161.0 lb

## 2012-01-26 DIAGNOSIS — H109 Unspecified conjunctivitis: Secondary | ICD-10-CM

## 2012-01-26 MED ORDER — POLYMYXIN B-TRIMETHOPRIM 10000-0.1 UNIT/ML-% OP SOLN: 1.0000 [drp] | OPHTHALMIC | Status: AC

## 2012-01-26 MED ORDER — POLYMYXIN B-TRIMETHOPRIM 10000-0.1 UNIT/ML-% OP SOLN: 1.0000 [drp] | OPHTHALMIC | Status: DC

## 2012-01-26 MED ORDER — AMPHETAMINE-DEXTROAMPHET ER 30 MG PO CP24: 30.0000 mg | ORAL_CAPSULE | ORAL | Status: DC

## 2012-01-26 NOTE — Progress Notes (Signed)
SUBJECTIVE:  31 y.o. male with burning, redness, discharge and mattering in right eye for 1 days.  No other symptoms.  No significant prior ophthalmological history. No change in visual acuity, no photophobia, no severe eye pain.  Patient Active Problem List  Diagnoses  . GENITAL HERPES  . ADHD  . EUSTACHIAN TUBE DYSFUNCTION, BILATERAL  . ASTHMA  . CELLULITIS  . INSOMNIA  . LUMBAR STRAIN, ACUTE  . ALLERGY  . ALLERGY TO INSECTS AND ARACHNIDS  . Elevated blood pressure   Past Medical History  Diagnosis Date  . Asthma   . ADD (attention deficit disorder)   . Herpes simplex   . GERD (gastroesophageal reflux disease)   . Varicose veins    No past surgical history on file. History  Substance Use Topics  . Smoking status: Never Smoker   . Smokeless tobacco: Not on file  . Alcohol Use: Yes     occasionally   No family history on file. No Known Allergies Current Outpatient Prescriptions on File Prior to Visit  Medication Sig Dispense Refill  . amphetamine-dextroamphetamine (ADDERALL XR, 30MG ,) 30 MG 24 hr capsule Take 1 capsule (30 mg total) by mouth every morning.  30 capsule  0  . budesonide-formoterol (SYMBICORT) 80-4.5 MCG/ACT inhaler Inhale 2 puffs into the lungs 2 (two) times daily.        Marland Kitchen Fexofenadine HCl (ALLEGRA PO) Take by mouth.        . mometasone (NASONEX) 50 MCG/ACT nasal spray Place 2 sprays into the nose daily.  17 g  12  . Montelukast Sodium (SINGULAIR PO) Take by mouth.        Maxwell Caul Bicarbonate (ZEGERID PO) Take by mouth.        . valACYclovir (VALTREX) 1000 MG tablet TAKE 1 TABLET BY MOUTH ONCE A DAY  90 tablet  3   The PMH, PSH, Social History, Family History, Medications, and allergies have been reviewed in [facility], and have been updated if relevant.  OBJECTIVE:  BP 124/80  Pulse 80  Temp(Src) 98.2 F (36.8 C) (Oral)  Wt 161 lb (73.029 kg)  Patient appears well, vitals signs are normal. Eyes: right eye with findings of typical  conjunctivitis noted; erythema and discharge. PERRLA, no foreign body noted. No periorbital cellulitis. The corneas are clear and fundi normal. Visual acuity normal.   ASSESSMENT:  Conjunctivitis - probably bacterial  PLAN:  Antibiotic drops per order- Polytrim. Hygiene discussed. If other family members develop same condition, may use same medication for them if they are not known to be allergic to it. Call prn.

## 2012-02-24 ENCOUNTER — Emergency Department (HOSPITAL_COMMUNITY)
Admission: EM | Admit: 2012-02-24 | Discharge: 2012-02-24 | Disposition: A | Discharge status: Home / Self Care | Payer: 59 | Source: Home / Self Care | Attending: Family Medicine | Admitting: Family Medicine

## 2012-02-24 ENCOUNTER — Encounter (HOSPITAL_COMMUNITY): Payer: Self-pay | Admitting: *Deleted

## 2012-02-24 DIAGNOSIS — M545 Low back pain, unspecified: Secondary | ICD-10-CM

## 2012-02-24 MED ORDER — CYCLOBENZAPRINE HCL 10 MG PO TABS: 10.0000 mg | ORAL_TABLET | Freq: Three times a day (TID) | ORAL | Status: AC | PRN

## 2012-02-24 MED ORDER — IBUPROFEN 600 MG PO TABS: 600.0000 mg | ORAL_TABLET | Freq: Three times a day (TID) | ORAL | Status: AC | PRN

## 2012-02-24 MED ORDER — TRAMADOL HCL 50 MG PO TABS: 50.0000 mg | ORAL_TABLET | Freq: Four times a day (QID) | ORAL | Status: AC | PRN

## 2012-02-24 MED ORDER — HYDROCODONE-ACETAMINOPHEN 5-500 MG PO TABS: 1.0000 | ORAL_TABLET | Freq: Four times a day (QID) | ORAL | Status: AC | PRN

## 2012-02-24 NOTE — Discharge Instructions (Signed)
Take the prescribed medications as instructed. B. aware that Vicodin Flexeril and tramadol can cause drowsiness and she should not drive after taking this medications. If you need to be alert and awake during daytime can take only ibuprofen and take these medications when you are home Started doing rehabilitation exercises as soon as pain improves. Followup with your primary care doctor if persistent pain after 1 week or recurrent despite following treatment.    Back Exercises Back exercises help treat and prevent back injuries. The goal of back exercises is to increase the strength of your abdominal and back muscles and the flexibility of your back. These exercises should be started when you no longer have back pain. Back exercises include:  Pelvic Tilt. Lie on your back with your knees bent. Tilt your pelvis until the lower part of your back is against the floor. Hold this position 5 to 10 sec and repeat 5 to 10 times.   Knee to Chest. Pull first 1 knee up against your chest and hold for 20 to 30 seconds, repeat this with the other knee, and then both knees. This may be done with the other leg straight or bent, whichever feels better.   Sit-Ups or Curl-Ups. Bend your knees 90 degrees. Start with tilting your pelvis, and do a partial, slow sit-up, lifting your trunk only 30 to 45 degrees off the floor. Take at least 2 to 3 seconds for each sit-up. Do not do sit-ups with your knees out straight. If partial sit-ups are difficult, simply do the above but with only tightening your abdominal muscles and holding it as directed.   Hip-Lift. Lie on your back with your knees flexed 90 degrees. Push down with your feet and shoulders as you raise your hips a couple inches off the floor; hold for 10 seconds, repeat 5 to 10 times.   Back arches. Lie on your stomach, propping yourself up on bent elbows. Slowly press on your hands, causing an arch in your low back. Repeat 3 to 5 times. Any initial stiffness and  discomfort should lessen with repetition over time.   Shoulder-Lifts. Lie face down with arms beside your body. Keep hips and torso pressed to floor as you slowly lift your head and shoulders off the floor.  Do not overdo your exercises, especially in the beginning. Exercises may cause you some mild back discomfort which lasts for a few minutes; however, if the pain is more severe, or lasts for more than 15 minutes, do not continue exercises until you see your caregiver. Improvement with exercise therapy for back problems is slow.  See your caregivers for assistance with developing a proper back exercise program. Document Released: 11/26/2004 Document Revised: 10/08/2011 Document Reviewed: 10/19/2005 [facility] Patient Information 2012 [place], [place].

## 2012-02-24 NOTE — ED Notes (Signed)
pT  REPORTS  SYMPTOMS  OF BACK  PAIN  WHICH  HE  REPORTS   HE  HAS  HAD     X   1  DAY  -  DENYS  ANY  SPECEFIC INJURY  AMBULATES   WITH FLUID  YET  SLOW  GAIT  DENYS  ANY  URINARY  SYMPTOMS

## 2012-02-25 NOTE — ED Notes (Signed)
Pain  Scale is  8  Out of ten  Lower  Back  Worse  On movement and  posistion

## 2012-02-26 NOTE — ED Provider Notes (Signed)
History     CSN: [id number]  Arrival date & time 02/24/12  1944   First MD Initiated Contact with Patient 02/24/12 1952      Chief Complaint  Patient presents with  . Back Pain    (Consider location/radiation/quality/duration/timing/severity/associated sxs/prior treatment) HPI Comments: 31 y/o male h/o asthma and ADD here c/o bilateral low back pain in last 24 hour. States he had been cleaning his garage before pain started. Denies radiation to lower extremities. No saddle anesthesia, urinary or stool incontinence. Denies low extremity weakness, hyperstesis or paresthesias. Also denies dysuria or hematuria, no fever or chills. Has similar event years ago. No h/o back/spine pathology.  Denies recent falls or direct blow type of injury. Taking ibuprofen 600 mg (twice today for pain) with some relief. Patient works as Occupational psychologist. Denies heavy weight lifting at work.   Past Medical History  Diagnosis Date  . Asthma   . ADD (attention deficit disorder)   . Herpes simplex   . GERD (gastroesophageal reflux disease)   . Varicose veins     History reviewed. No pertinent past surgical history.  History reviewed. No pertinent family history.  History  Substance Use Topics  . Smoking status: Never Smoker   . Smokeless tobacco: Not on file  . Alcohol Use: Yes     occasionally      Review of Systems  Constitutional: Negative for fever, chills and appetite change.  Respiratory: Negative for cough.   Genitourinary: Negative for dysuria, frequency and hematuria.  Musculoskeletal: Positive for back pain.  Skin: Negative for rash.    Allergies  Review of patient's allergies indicates no known allergies.  Home Medications   Current Outpatient Rx  Name Route Sig Dispense Refill  . AMPHETAMINE-DEXTROAMPHET ER 30 MG PO CP24 Oral Take 1 capsule (30 mg total) by mouth every morning. 30 capsule 0  . BUDESONIDE-FORMOTEROL FUMARATE 80-4.5 MCG/ACT IN AERO Inhalation Inhale 2 puffs into  the lungs 2 (two) times daily.      . CYCLOBENZAPRINE HCL 10 MG PO TABS Oral Take 1 tablet (10 mg total) by mouth 3 (three) times daily as needed for muscle spasms. 30 tablet 0  . ALLEGRA PO Oral Take by mouth.      Marland Kitchen HYDROCODONE-ACETAMINOPHEN 5-500 MG PO TABS Oral Take 1 tablet by mouth every 6 (six) hours as needed for pain. 10 tablet 0  . IBUPROFEN 600 MG PO TABS Oral Take 1 tablet (600 mg total) by mouth every 8 (eight) hours as needed for pain. 30 tablet 0  . MOMETASONE FUROATE 50 MCG/ACT NA SUSP Nasal Place 2 sprays into the nose daily. 17 g 12  . SINGULAIR PO Oral Take by mouth.      Marland Kitchen ZEGERID PO Oral Take by mouth.      . TRAMADOL HCL 50 MG PO TABS Oral Take 1 tablet (50 mg total) by mouth every 6 (six) hours as needed for pain. 15 tablet 0  . VALACYCLOVIR HCL 1 G PO TABS  TAKE 1 TABLET BY MOUTH ONCE A DAY 90 tablet 3    BP 180/104  Pulse 84  Temp(Src) 98.6 F (37 C) (Oral)  Resp 18  SpO2 100%  Physical Exam  Nursing note and vitals reviewed. Constitutional: He is oriented to person, place, and time. He appears well-developed and well-nourished. No distress.  HENT:  Head: Normocephalic and atraumatic.  Eyes: Conjunctivae and EOM are normal. No scleral icterus.  Neck: Normal range of motion.  Cardiovascular: Normal heart sounds.  Pulmonary/Chest: Breath sounds normal.  Abdominal:       No CVT  Musculoskeletal:       Spine central with no obvious scoliosis or kyphosis.  Fair flexion and extension despite reported pain. Increased muscle tone and tenderness to palpation over lumbar paravertebral muscles bilaterally.   Neurological: He is alert and oriented to person, place, and time. He has normal reflexes.       No low extremity weakness or anesthesia.  Skin: No rash noted.    ED Course  Procedures (including critical care time)  Labs Reviewed - No data to display No results found.   1. Low back pain       MDM  Likely lumbar muscle spasms. Prescribed  ibuprofen, flexeril, Vicodin and can substitute with tramadol if Vicodin make him too drowsy. Start low back exercises as soon as pain improves. Follow up with PCP if persistent, recurrent pain or new symptoms.       Sharin Grave, MD 02/26/12 1327

## 2012-02-29 ENCOUNTER — Other Ambulatory Visit: Payer: Self-pay

## 2012-02-29 MED ORDER — AMPHETAMINE-DEXTROAMPHET ER 30 MG PO CP24: 30.0000 mg | ORAL_CAPSULE | ORAL | Status: DC

## 2012-02-29 NOTE — Telephone Encounter (Signed)
Pt left v/m requesting written rx Adderall XR 30 mg. Pt last seen 01/26/12. When rx ready for pick up please call [phone].

## 2012-03-01 NOTE — Telephone Encounter (Signed)
Pt called to see if rx ready for pick up. Jacki Cones said she would place at front desk for pick up. Patient notified as instructed by telephone. Prescription left at front desk.

## 2012-03-29 ENCOUNTER — Other Ambulatory Visit: Payer: Self-pay | Admitting: Family Medicine

## 2012-03-29 ENCOUNTER — Other Ambulatory Visit: Payer: Self-pay

## 2012-03-29 MED ORDER — AMPHETAMINE-DEXTROAMPHET ER 30 MG PO CP24: 30.0000 mg | ORAL_CAPSULE | ORAL | Status: DC

## 2012-03-29 NOTE — Telephone Encounter (Signed)
Script placed up front for pick up, left message on voice mail advising pt script is ready.

## 2012-03-29 NOTE — Telephone Encounter (Signed)
Pt request rx Adderall XR. Call when ready for pick up. 

## 2012-03-29 NOTE — Telephone Encounter (Signed)
Pt is needing refill on his Adderall

## 2012-04-22 ENCOUNTER — Emergency Department (INDEPENDENT_AMBULATORY_CARE_PROVIDER_SITE_OTHER)
Admission: EM | Admit: 2012-04-22 | Discharge: 2012-04-22 | Disposition: A | Discharge status: Home / Self Care | Payer: 59 | Source: Home / Self Care | Attending: Emergency Medicine | Admitting: Emergency Medicine

## 2012-04-22 ENCOUNTER — Encounter (HOSPITAL_COMMUNITY): Payer: Self-pay

## 2012-04-22 DIAGNOSIS — S39012A Strain of muscle, fascia and tendon of lower back, initial encounter: Secondary | ICD-10-CM

## 2012-04-22 DIAGNOSIS — S335XXA Sprain of ligaments of lumbar spine, initial encounter: Secondary | ICD-10-CM

## 2012-04-22 MED ORDER — HYDROCODONE-ACETAMINOPHEN 5-325 MG PO TABS: ORAL_TABLET | ORAL | Status: AC

## 2012-04-22 MED ORDER — DICLOFENAC SODIUM 75 MG PO TBEC: 75.0000 mg | DELAYED_RELEASE_TABLET | Freq: Two times a day (BID) | ORAL | Status: DC

## 2012-04-22 MED ORDER — CYCLOBENZAPRINE HCL 5 MG PO TABS: 5.0000 mg | ORAL_TABLET | Freq: Three times a day (TID) | ORAL | Status: AC | PRN

## 2012-04-22 NOTE — ED Provider Notes (Signed)
Chief Complaint  Patient presents with  . Back Pain    History of Present Illness:   The patient is a 31 year old male who bent down to pick up a movie cassette from a console 4 days ago at home. He felt a pop in his lower back and then fell to the floor. Thereafter he felt his back stiffening up. The pain is located in the mid lower back without radiation. It's worse with any type of movement and better with rest. He's tried a Flector patch and Flexeril with some improvement in the pain. He had a similar episode 2 months ago for which she was seen here and given Flexeril and Vicodin and the symptoms gradually improved on their own. He denies any paresthesias, muscle weakness, bladder or bowel complaints, fever, chills, weight loss, or abdominal pain.  Review of Systems:  Other than noted above, the patient denies any of the following symptoms: Systemic:  No fever, chills, fatigue, or weight loss. GI:  No abdominal pain, nausea, vomiting, diarrhea, constipation or blood in stool. GU:  No dysuria, frequency, urgency, or hematuria. No incontinence or difficulty urinating.  M-S:  No neck pain, joint pain, arthritis, or myalgias. Neuro:  No parethesias or muscular weakness. Skin:  No rash or itching.   PMFSH:  Past medical history, family history, social history, meds, and allergies were reviewed.  Physical Exam:   Vital signs:  BP 134/72  Pulse 84  Temp 98.6 F (37 C) (Oral)  Resp 16  SpO2 98% General:  Alert, oriented, in no distress. Abdomen:  Soft, non-tender.  No organomegaly or mass.  No pulsatile midline abdominal mass or bruit. Back:  He has a Flector patch in place and there is pain to palpation in the mid lower lumbar spine at the L5-S1 area. There is no pain to palpation of the sacroiliac joints, up higher on the back, or in the paravertebral areas. The back has a limited range of motion with only about 20 of flexion with pain, he has 10 of extension, 20 of lateral bending to the  right, 10 of lateral bending to the left, an 85 of rotation to each direction with pain. Straight leg raising produces some lower back pain but no radiating pain. Lasegue's sign was negative. Popliteal compression sign was negative. Neuro:  Normal muscle strength, sensations and DTRs. Extremities: Pedal pulses were full, there was no edema. Skin:  Clear, warm and dry.  No rash.  Assessment:  The encounter diagnosis was Lumbar strain.  Plan:   1.  The following meds were prescribed:   New Prescriptions   CYCLOBENZAPRINE (FLEXERIL) 5 MG TABLET    Take 1 tablet (5 mg total) by mouth 3 (three) times daily as needed for muscle spasms.   DICLOFENAC (VOLTAREN) 75 MG EC TABLET    Take 1 tablet (75 mg total) by mouth 2 (two) times daily.   HYDROCODONE-ACETAMINOPHEN (NORCO) 5-325 MG PER TABLET    1 to 2 tabs every 4 to 6 hours as needed for pain.   2.  The patient was instructed in symptomatic care and handouts were given. 3.  The patient was told to return if becoming worse in any way, if no better in 2 weeks, and given some red flag symptoms that would indicate earlier return. 4.  The patient was encouraged to try to be as active as possible and given some exercises to do followed by moist heat.  Follow up:  The patient was told to follow up  with Dr. Ranell Patrick if no improvement in 2-4 weeks. He was given some back exercises to do in the meantime.     Reuben Likes, MD 04/22/12 [phone]

## 2012-04-22 NOTE — Discharge Instructions (Signed)
Do exercises twice daily followed by moist heat for 15 minutes. ° ° ° ° ° °Try to be as active as possible. ° °If no better in 2 weeks, follow up with orthopedist. ° ° °

## 2012-04-22 NOTE — ED Notes (Signed)
States he bent over to pick up a movie on Tuesday, heard a pop in his lower back and felt immediate pain and tightness.  States since then has gotten tighter and tighter.  Has been stretching and taking leftover tramadol and flexeril which helps while he is taking it.  Is very active, but at times has trouble with tightness in low back after running or cycling.  This is usually relieved with stretching

## 2012-04-22 NOTE — ED Notes (Signed)
Pt called no answer x1 

## 2012-04-28 ENCOUNTER — Encounter: Payer: Self-pay | Admitting: Family Medicine

## 2012-04-28 ENCOUNTER — Ambulatory Visit (INDEPENDENT_AMBULATORY_CARE_PROVIDER_SITE_OTHER): Payer: 59 | Admitting: Family Medicine

## 2012-04-28 ENCOUNTER — Ambulatory Visit (INDEPENDENT_AMBULATORY_CARE_PROVIDER_SITE_OTHER)
Admission: RE | Admit: 2012-04-28 | Discharge: 2012-04-28 | Disposition: A | Discharge status: Home / Self Care | Payer: 59 | Source: Ambulatory Visit | Attending: Family Medicine | Admitting: Family Medicine

## 2012-04-28 VITALS — BP 126/90 | HR 80 | Temp 98.5°F | Wt 159.0 lb

## 2012-04-28 DIAGNOSIS — S335XXA Sprain of ligaments of lumbar spine, initial encounter: Secondary | ICD-10-CM

## 2012-04-28 MED ORDER — AMPHETAMINE-DEXTROAMPHET ER 30 MG PO CP24: 30.0000 mg | ORAL_CAPSULE | ORAL | Status: DC

## 2012-04-28 MED ORDER — DEXAMETHASONE SOD PHOSPHATE PF 10 MG/ML IJ SOLN
10.0000 mg | Freq: Once | INTRAMUSCULAR | Status: AC
  Administered 2012-04-28: 10 mg via INTRAMUSCULAR

## 2012-04-28 NOTE — Progress Notes (Signed)
30 here for UC followed up- persistent back pain.  Notes reviewed.  Was seen at [facility] on 6/21 with acute onset of back pain.  4 days prior he bent down in console at home to get a movie. He felt a pop in his lower back and then fell to the floor. Thereafter he felt his back stiffening up. The pain is located in the mid lower back without radiation. It's worse with any type of movement and with sitting.  Was given vicodin, diclofenac, and flexeril. He is here for follow up because he is still feeling pain in his mid to left lower back.   Patient Active Problem List  Diagnosis  . GENITAL HERPES  . ADHD  . EUSTACHIAN TUBE DYSFUNCTION, BILATERAL  . ASTHMA  . CELLULITIS  . INSOMNIA  . LUMBAR STRAIN, ACUTE  . ALLERGY  . ALLERGY TO INSECTS AND ARACHNIDS  . Elevated blood pressure   Past Medical History  Diagnosis Date  . Asthma   . ADD (attention deficit disorder)   . Herpes simplex   . GERD (gastroesophageal reflux disease)   . Varicose veins    No past surgical history on file. History  Substance Use Topics  . Smoking status: Never Smoker   . Smokeless tobacco: Not on file  . Alcohol Use: Yes     occasionally   No family history on file. No Known Allergies Current Outpatient Prescriptions on File Prior to Visit  Medication Sig Dispense Refill  . amphetamine-dextroamphetamine (ADDERALL XR, 30MG ,) 30 MG 24 hr capsule Take 1 capsule (30 mg total) by mouth every morning.  30 capsule  0  . budesonide-formoterol (SYMBICORT) 80-4.5 MCG/ACT inhaler Inhale 2 puffs into the lungs 2 (two) times daily.        . cyclobenzaprine (FLEXERIL) 5 MG tablet Take 1 tablet (5 mg total) by mouth 3 (three) times daily as needed for muscle spasms.  30 tablet  0  . diclofenac (VOLTAREN) 75 MG EC tablet Take 1 tablet (75 mg total) by mouth 2 (two) times daily.  20 tablet  0  . Fexofenadine HCl (ALLEGRA PO) Take by mouth.        Marland Kitchen HYDROcodone-acetaminophen (NORCO) 5-325 MG per tablet 1 to 2 tabs every 4  to 6 hours as needed for pain.  20 tablet  0  . mometasone (NASONEX) 50 MCG/ACT nasal spray Place 2 sprays into the nose daily.  17 g  12  . Montelukast Sodium (SINGULAIR PO) Take by mouth.        Maxwell Caul Bicarbonate (ZEGERID PO) Take by mouth.        . valACYclovir (VALTREX) 1000 MG tablet TAKE 1 TABLET BY MOUTH ONCE A DAY  90 tablet  3      Review of Systems:   As per HPI  Physical Exam:  BP 126/90  Pulse 80  Temp 98.5 F (36.9 C)  Wt 159 lb (72.122 kg)  General: Alert, oriented, in no distress.  Abdomen: Soft, non-tender. No organomegaly or mass. No pulsatile midline abdominal mass or bruit.  Back:  There is some mild pain to palpation of the sacroiliac joints, no pain in lumbar spine or or in the paravertebral areas to palpation.  SLR neg and faber neg bilaterally Extremities: Pedal pulses were full, there was no edema.  Skin: Clear, warm and dry. No rash.  Assessment and Plan: 1. LUMBAR STRAIN, ACUTE  DG Lumbar Spine Complete, dexamethasone Sod Phosphate PF SOLN 10 mg   ?  Some SI joint involvement as well. Given exercises for SI joint pain. Given that pain is different from his prior episodes, will get and xray today. IM decadron in office today. Pt to call me next week with an update. If pain persists, consider further imaging/PT.

## 2012-04-28 NOTE — Patient Instructions (Addendum)
Good to see you. We will call you with your xray results. Please continue supportive care with flexeril, vicodin. Try exercises in handout.   Call me next week with an update.

## 2012-06-01 ENCOUNTER — Telehealth: Payer: Self-pay | Admitting: Family Medicine

## 2012-06-01 MED ORDER — AMPHETAMINE-DEXTROAMPHET ER 30 MG PO CP24: 30.0000 mg | ORAL_CAPSULE | ORAL | Status: DC

## 2012-06-01 NOTE — Telephone Encounter (Signed)
Script is on your desk for signature. 

## 2012-06-01 NOTE — Telephone Encounter (Signed)
Caller: Bradley Schaefer/Patient; PCP: Ruthe Mannan (Nestor Ramp); CB#: [phone]; ; ; Call regarding Script for Adderall;  Calling for refill on Adderall 30 mg XR, has dose for 06/02/12, please call when script is ready for p/u.

## 2012-06-02 NOTE — Telephone Encounter (Signed)
Signed, in my box. 

## 2012-06-02 NOTE — Telephone Encounter (Signed)
Left message on voice mail advising pt that script is ready for pick up, script placed at front desk.

## 2012-06-24 ENCOUNTER — Encounter: Payer: Self-pay | Admitting: Family Medicine

## 2012-06-24 ENCOUNTER — Ambulatory Visit (INDEPENDENT_AMBULATORY_CARE_PROVIDER_SITE_OTHER): Payer: 59 | Admitting: Family Medicine

## 2012-06-24 VITALS — BP 130/78 | HR 88 | Temp 98.2°F | Wt 158.8 lb

## 2012-06-24 DIAGNOSIS — S335XXA Sprain of ligaments of lumbar spine, initial encounter: Secondary | ICD-10-CM

## 2012-06-24 MED ORDER — CYCLOBENZAPRINE HCL 10 MG PO TABS: 10.0000 mg | ORAL_TABLET | Freq: Two times a day (BID) | ORAL | Status: AC | PRN

## 2012-06-24 MED ORDER — NAPROXEN 500 MG PO TABS: ORAL_TABLET | ORAL | Status: AC

## 2012-06-24 MED ORDER — TRAMADOL HCL 50 MG PO TABS: 50.0000 mg | ORAL_TABLET | Freq: Three times a day (TID) | ORAL | Status: AC | PRN

## 2012-06-24 NOTE — Patient Instructions (Signed)
You may have irritated sacroiliac joint again along with lumbar strain with left paraspinous muscle tightness. Treat with naprosyn and flexeril as needed, tramadol for breakthrough pain (although naprosyn might be enough). Update [place] if not improving as expected.  Good to meety out oday, call us with quesitons.

## 2012-06-24 NOTE — Assessment & Plan Note (Signed)
Acute left lumbar strain as well as possible re irritation of left sacroiliitis. Knee contusion. Treat with NSAIDs, flexeril, and tramadol for breakthrough pain. Do stretching exercises at home for sacroiliitis.
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
## 2012-07-01 ENCOUNTER — Other Ambulatory Visit: Payer: Self-pay | Admitting: Family Medicine

## 2012-07-01 MED ORDER — AMPHETAMINE-DEXTROAMPHET ER 30 MG PO CP24: 30.0000 mg | ORAL_CAPSULE | ORAL | Status: DC

## 2012-07-01 NOTE — Telephone Encounter (Signed)
Pt is needing refill on adderall °

## 2012-07-01 NOTE — Telephone Encounter (Signed)
Printed and placed in Kim's box. 

## 2012-07-01 NOTE — Telephone Encounter (Signed)
Patient notified and Rx placed up front for pick up. 

## 2012-08-05 ENCOUNTER — Other Ambulatory Visit: Payer: Self-pay

## 2012-08-05 MED ORDER — AMPHETAMINE-DEXTROAMPHET ER 30 MG PO CP24: 30.0000 mg | ORAL_CAPSULE | ORAL | Status: DC

## 2012-08-05 NOTE — Telephone Encounter (Signed)
Pt request refill adderall.call when ready for pick up.

## 2012-08-05 NOTE — Telephone Encounter (Signed)
Advised patient that script is ready for pick up, script placed at front desk. 

## 2012-09-06 ENCOUNTER — Other Ambulatory Visit: Payer: Self-pay

## 2012-09-06 NOTE — Telephone Encounter (Signed)
t left v/m requesting Adderall XR. Call when ready for pick up.

## 2012-09-07 ENCOUNTER — Encounter: Payer: Self-pay | Admitting: Family Medicine

## 2012-09-07 ENCOUNTER — Ambulatory Visit (HOSPITAL_COMMUNITY)
Admission: RE | Admit: 2012-09-07 | Discharge: 2012-09-07 | Disposition: A | Discharge status: Home / Self Care | Payer: 59 | Source: Ambulatory Visit | Attending: Family Medicine | Admitting: Family Medicine

## 2012-09-07 ENCOUNTER — Ambulatory Visit (INDEPENDENT_AMBULATORY_CARE_PROVIDER_SITE_OTHER): Payer: 59 | Admitting: Family Medicine

## 2012-09-07 ENCOUNTER — Ambulatory Visit (INDEPENDENT_AMBULATORY_CARE_PROVIDER_SITE_OTHER)
Admission: RE | Admit: 2012-09-07 | Discharge: 2012-09-07 | Disposition: A | Discharge status: Home / Self Care | Payer: 59 | Source: Ambulatory Visit | Attending: Family Medicine | Admitting: Family Medicine

## 2012-09-07 VITALS — BP 130/90 | HR 112 | Temp 98.3°F | Ht 66.5 in | Wt 158.8 lb

## 2012-09-07 DIAGNOSIS — M25569 Pain in unspecified knee: Secondary | ICD-10-CM

## 2012-09-07 DIAGNOSIS — M25561 Pain in right knee: Secondary | ICD-10-CM

## 2012-09-07 MED ORDER — AMPHETAMINE-DEXTROAMPHET ER 30 MG PO CP24: 30.0000 mg | ORAL_CAPSULE | ORAL | Status: DC

## 2012-09-07 NOTE — Telephone Encounter (Signed)
Left message on voice mail advising patient script is ready for pick up. 

## 2012-09-07 NOTE — Patient Instructions (Addendum)
REFERRAL: GO THE THE FRONT ROOM AT THE ENTRANCE OF OUR CLINIC, NEAR CHECK IN. ASK FOR MARION. SHE WILL HELP YOU SET UP YOUR REFERRAL. DATE: TIME:  
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
## 2012-09-09 ENCOUNTER — Ambulatory Visit (HOSPITAL_COMMUNITY): Admission: RE | Admit: 2012-09-09 | Payer: 59 | Source: Ambulatory Visit

## 2012-09-12 ENCOUNTER — Encounter: Payer: Self-pay | Admitting: Family Medicine

## 2012-09-12 ENCOUNTER — Ambulatory Visit (INDEPENDENT_AMBULATORY_CARE_PROVIDER_SITE_OTHER): Payer: 59 | Admitting: Family Medicine

## 2012-09-12 VITALS — BP 130/92 | HR 84 | Temp 98.5°F | Ht 66.5 in | Wt 159.5 lb

## 2012-09-12 DIAGNOSIS — M6580 Other synovitis and tenosynovitis, unspecified site: Secondary | ICD-10-CM

## 2012-09-12 DIAGNOSIS — M659 Synovitis and tenosynovitis, unspecified: Secondary | ICD-10-CM

## 2012-09-12 MED ORDER — DICLOFENAC SODIUM 75 MG PO TBEC: 75.0000 mg | DELAYED_RELEASE_TABLET | Freq: Two times a day (BID) | ORAL | Status: DC

## 2012-09-12 NOTE — Patient Instructions (Addendum)
BODYHELIX  Www.bodyhelix.com  Use website instuctions for measurement of limb to determine size.   Look for "Patellar Helix" - this should be placed underneath the kneecap on the affected side and above the bony part at the upper end of your tibia. - It should fit in the soft spot where your patellar tendon is located.  Over the years, I have found that athletes and active people like this product the most with patellar tendonitis. It costs about 40 dollars.  (I have no financial interest in this company and gain nothing from recommending their products) 
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
## 2012-10-04 ENCOUNTER — Other Ambulatory Visit: Payer: Self-pay

## 2012-10-04 NOTE — Telephone Encounter (Signed)
Pt left v/m requesting rx adderall. Call when ready for pick up. Pt would like to pick up by 10/06/12.

## 2012-10-05 MED ORDER — AMPHETAMINE-DEXTROAMPHET ER 30 MG PO CP24: 30.0000 mg | ORAL_CAPSULE | ORAL | Status: DC

## 2012-10-05 NOTE — Telephone Encounter (Signed)
Advised patient script is ready for pick up at front desk.

## 2012-11-07 ENCOUNTER — Other Ambulatory Visit: Payer: Self-pay

## 2012-11-07 MED ORDER — AMPHETAMINE-DEXTROAMPHET ER 30 MG PO CP24: 30.0000 mg | ORAL_CAPSULE | ORAL | Status: DC

## 2012-11-07 NOTE — Telephone Encounter (Signed)
Advised patient script is ready for pick up at front desk. 

## 2012-11-07 NOTE — Telephone Encounter (Signed)
pts mother left v/m requesting rx Adderall. Call pt when ready for pick up.

## 2012-12-08 ENCOUNTER — Other Ambulatory Visit: Payer: Self-pay

## 2012-12-08 MED ORDER — AMPHETAMINE-DEXTROAMPHET ER 30 MG PO CP24: 30.0000 mg | ORAL_CAPSULE | ORAL | Status: DC

## 2012-12-08 NOTE — Telephone Encounter (Signed)
Pt left v/m requesting rx adderall. Call when ready for pick up. 

## 2012-12-08 NOTE — Telephone Encounter (Signed)
Advised patient script is ready for pick up at front desk. 

## 2012-12-19 ENCOUNTER — Telehealth: Payer: Self-pay

## 2012-12-19 NOTE — Telephone Encounter (Signed)
Pt left v/m requesting status of prior auth adderall.Pt uses [facility].Please advise.

## 2012-12-19 NOTE — Telephone Encounter (Signed)
Left message advising patient that he needs to have his pharmacy fax a form for prior authorization.

## 2012-12-19 NOTE — Telephone Encounter (Signed)
Left message asking patient to call back.  I have not received anything from his pharmacy regarding medicine needing prior auth.

## 2012-12-26 ENCOUNTER — Telehealth: Payer: Self-pay | Admitting: *Deleted

## 2012-12-26 NOTE — Telephone Encounter (Signed)
Prior auth given over the phone for adderall.  Advised patient.  Approval letter placed on doctor's desk for signature and scanning.

## 2013-01-23 ENCOUNTER — Other Ambulatory Visit: Payer: Self-pay

## 2013-01-23 MED ORDER — AMPHETAMINE-DEXTROAMPHET ER 30 MG PO CP24: 30.0000 mg | ORAL_CAPSULE | ORAL | Status: DC

## 2013-01-23 MED ORDER — VALACYCLOVIR HCL 1 G PO TABS: ORAL_TABLET | ORAL | Status: DC

## 2013-01-23 NOTE — Telephone Encounter (Signed)
Advised patient script is ready for pick up.  He is also asking for refills on [facility]  [place].

## 2013-01-23 NOTE — Telephone Encounter (Signed)
Pt left v/m requesting rx adderall xr. Call when ready for pick up.

## 2013-02-22 ENCOUNTER — Other Ambulatory Visit: Payer: Self-pay

## 2013-02-22 MED ORDER — AMPHETAMINE-DEXTROAMPHET ER 30 MG PO CP24: 30.0000 mg | ORAL_CAPSULE | ORAL | Status: DC

## 2013-02-22 NOTE — Telephone Encounter (Signed)
Pt left v/m requesting rx Adderall XR. Call when ready for pick up.

## 2013-02-22 NOTE — Telephone Encounter (Signed)
Ok to print and put in my box. 

## 2013-02-23 NOTE — Telephone Encounter (Signed)
Advised patient script is ready for pick up, placed at front desk. 

## 2013-03-28 ENCOUNTER — Other Ambulatory Visit: Payer: Self-pay

## 2013-03-28 MED ORDER — AMPHETAMINE-DEXTROAMPHET ER 30 MG PO CP24: 30.0000 mg | ORAL_CAPSULE | ORAL | Status: DC

## 2013-03-28 NOTE — Telephone Encounter (Signed)
On Dr. Elmer Sow desk for signature.

## 2013-03-28 NOTE — Telephone Encounter (Signed)
Pt left v/m requesting rx adderall. Call when ready for pick up. 

## 2013-03-29 NOTE — Telephone Encounter (Signed)
Pt left v/m requesting call back [phone].

## 2013-03-29 NOTE — Telephone Encounter (Signed)
Left message on voice mail advising patient script is ready for pick up. 

## 2013-03-29 NOTE — Telephone Encounter (Signed)
Advised patient script is ready for pick up. 

## 2013-04-25 ENCOUNTER — Telehealth: Payer: Self-pay | Admitting: Family Medicine

## 2013-04-25 MED ORDER — AMPHETAMINE-DEXTROAMPHET ER 30 MG PO CP24: 30.0000 mg | ORAL_CAPSULE | ORAL | Status: DC

## 2013-04-25 NOTE — Telephone Encounter (Signed)
Patient is calling for a refill on his Aderall.  Patient has 4 days left and he will be working 12 hour shifts on Thursday and Friday.  Patient is asking if he can pick up the  prescription by tomorrow.  Please call patient when prescription is ready.  Dr. Dayton Martes is off until Friday.

## 2013-04-25 NOTE — Telephone Encounter (Signed)
Patient advised.  Rx left at front desk for pick up. 

## 2013-04-25 NOTE — Addendum Note (Signed)
Addended by: Joaquim Nam on: 04/25/2013 02:18 PM   Modules accepted: Orders

## 2013-04-25 NOTE — Telephone Encounter (Signed)
Printed.  Thanks.  

## 2013-04-26 ENCOUNTER — Encounter: Payer: Self-pay | Admitting: Family Medicine

## 2013-05-09 ENCOUNTER — Encounter: Payer: Self-pay | Admitting: Family Medicine

## 2013-06-01 ENCOUNTER — Encounter: Payer: Self-pay | Admitting: *Deleted

## 2013-06-01 ENCOUNTER — Ambulatory Visit (INDEPENDENT_AMBULATORY_CARE_PROVIDER_SITE_OTHER): Payer: BC Managed Care – PPO | Admitting: Family Medicine

## 2013-06-01 ENCOUNTER — Encounter: Payer: Self-pay | Admitting: Family Medicine

## 2013-06-01 VITALS — BP 120/90 | HR 84 | Temp 98.2°F | Ht 66.5 in | Wt 149.0 lb

## 2013-06-01 DIAGNOSIS — Z8042 Family history of malignant neoplasm of prostate: Secondary | ICD-10-CM

## 2013-06-01 DIAGNOSIS — Z0000 Encounter for general adult medical examination without abnormal findings: Secondary | ICD-10-CM

## 2013-06-01 DIAGNOSIS — Z136 Encounter for screening for cardiovascular disorders: Secondary | ICD-10-CM

## 2013-06-01 DIAGNOSIS — F909 Attention-deficit hyperactivity disorder, unspecified type: Secondary | ICD-10-CM

## 2013-06-01 LAB — PSA: PSA: 0.58 ng/mL (ref 0.10–4.00)

## 2013-06-01 LAB — COMPREHENSIVE METABOLIC PANEL
ALT: 16 U/L (ref 0–53)
AST: 17 U/L (ref 0–37)
Albumin: 4.3 g/dL (ref 3.5–5.2)
Alkaline Phosphatase: 49 U/L (ref 39–117)
BUN: 10 mg/dL (ref 6–23)
CO2: 30 mEq/L (ref 19–32)
Calcium: 9.7 mg/dL (ref 8.4–10.5)
Chloride: 103 mEq/L (ref 96–112)
Creatinine, Ser: 1 mg/dL (ref 0.4–1.5)
GFR: 96.46 mL/min (ref >60.00)
Glucose, Bld: 96 mg/dL (ref 70–99)
Potassium: 4.2 mEq/L (ref 3.5–5.1)
Sodium: 138 mEq/L (ref 135–145)
Total Bilirubin: 0.7 mg/dL (ref 0.3–1.2)
Total Protein: 7.3 g/dL (ref 6.0–8.3)

## 2013-06-01 LAB — LIPID PANEL
Cholesterol: 175 mg/dL (ref 0–200)
HDL: 56.3 mg/dL (ref >39.00)
LDL Cholesterol: 108 mg/dL — ABNORMAL HIGH (ref 0–99)
Total CHOL/HDL Ratio: 3
Triglycerides: 54 mg/dL (ref 0.0–149.0)
VLDL: 10.8 mg/dL (ref 0.0–40.0)

## 2013-06-01 MED ORDER — AMPHETAMINE-DEXTROAMPHET ER 30 MG PO CP24: 30.0000 mg | ORAL_CAPSULE | ORAL | Status: DC

## 2013-06-01 NOTE — Patient Instructions (Addendum)
Good to see you. We will call you with your lab results or you can view them online.  We are continuing your current dose.

## 2013-06-01 NOTE — Progress Notes (Signed)
32 yo with h/o ADD here to discuss Adderall dose and to "have routine labs done."   Currently taking Adderall 30 mg XL daily.   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  Has had no issues with insomina or decreased appetite.  No n/v/d.  Does not feel jittery.  Has lost 10 pounds but has improved his diet and feels weight loss is intentional.  Wt Readings from Last 3 Encounters:  06/01/13 149 lb (67.586 kg)  09/12/12 159 lb 8 oz (72.349 kg)  09/07/12 158 lb 12.8 oz (72.031 kg)   Wants to be screened for prostate CA.  Denies any difficulty starting or stopping his urinary stream.  His cousin was just diagnosed with prostate CA at age 31. He has no other known family h/o prostate CA.   Patient Active Problem List   Diagnosis Date Noted  . Elevated blood pressure 12/29/2011  . LUMBAR STRAIN, ACUTE 11/19/2009  . ALLERGY TO INSECTS AND ARACHNIDS 06/05/2009  . GENITAL HERPES 04/05/2009  . ADHD 04/05/2009  . CELLULITIS 06/11/2008  . INSOMNIA 03/22/2008  . ASTHMA 02/25/2008  . ALLERGY 02/25/2008  . 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 Asthma   . ADD (attention deficit disorder)   . Herpes simplex   . GERD (gastroesophageal reflux disease)   . Varicose veins    No past surgical history on file. History  Substance Use Topics  . Smoking status: Never Smoker   . Smokeless tobacco: Not on file  . Alcohol Use: Yes     Comment: occasionally   No family history on file. No Known Allergies Current Outpatient Prescriptions on File Prior to Visit  Medication Sig Dispense Refill  . amphetamine-dextroamphetamine (ADDERALL XR) 30 MG 24 hr capsule Take 1 capsule (30 mg total) by mouth every morning.  30 capsule  0  . Fexofenadine HCl (ALLEGRA PO) Take by mouth.        . mometasone (NASONEX) 50 MCG/ACT nasal spray Place 2 sprays into the nose daily.  17 g  12  . naproxen (NAPROSYN) 500 MG tablet Take one po bid x  1 week then prn pain, take with food  60 tablet  0  . Omeprazole-Sodium Bicarbonate (ZEGERID PO) Take by mouth.        . valACYclovir (VALTREX) 1000 MG tablet TAKE 1 TABLET BY MOUTH ONCE A DAY  90 tablet  0   No current facility-administered medications on file prior to visit.   The PMH, PSH, Social History, Family History, Medications, and allergies have been reviewed in [facility], and have been updated if relevant.    Review of Systems  See HPI   Neuro: Denies headaches, memory loss, visual disturbances, and weakness.   Physical Exam BP 120/90  Pulse 84  Temp(Src) 98.2 F (36.8 C)  Ht 5' 6.5" (1.689 m)  Wt 149 lb (67.586 kg)  BMI 23.69 kg/m2   General: alert, well-developed, well-nourished, and well-hydrated.  Lungs: normal respiratory effort, no intercostal retractions, no accessory muscle use, and normal breath sounds.  Heart: mildly tachycardic, regular rhythm, and no murmur.  Psych: normally interactive, good eye contact, not anxious appearing, and not depressed appearing.   Assessment and Plan:  1. ADHD Stable on current dose of Adderall.  Rx refilled.    2. Family history of prostate cancer  - PSA  3. Screening for ischemic heart disease  - Lipid Panel

## 2013-06-30 ENCOUNTER — Other Ambulatory Visit: Payer: Self-pay

## 2013-06-30 MED ORDER — AMPHETAMINE-DEXTROAMPHET ER 30 MG PO CP24: 30.0000 mg | ORAL_CAPSULE | ORAL | Status: DC

## 2013-06-30 NOTE — Telephone Encounter (Signed)
Pt left v/m requesting rx adderall XR. Call when ready for pick up.

## 2013-07-04 MED ORDER — AMPHETAMINE-DEXTROAMPHET ER 30 MG PO CP24: 30.0000 mg | ORAL_CAPSULE | ORAL | Status: DC

## 2013-07-04 NOTE — Telephone Encounter (Signed)
Left message on voice mail advising patient script is ready for pick up. 

## 2013-07-04 NOTE — Telephone Encounter (Signed)
Pt had called and left message that he had not received notification about rx status. I called pt back and informed him that rx was ready at our front desk.

## 2013-07-24 ENCOUNTER — Other Ambulatory Visit: Payer: Self-pay | Admitting: Family Medicine

## 2013-07-24 NOTE — Telephone Encounter (Signed)
Last office visit 06/01/2013.  Ok to refill? 

## 2013-07-31 ENCOUNTER — Other Ambulatory Visit: Payer: Self-pay

## 2013-07-31 MED ORDER — AMPHETAMINE-DEXTROAMPHET ER 30 MG PO CP24: 30.0000 mg | ORAL_CAPSULE | ORAL | Status: DC

## 2013-07-31 NOTE — Telephone Encounter (Signed)
Patient notified that script is up front and ready for pickup. 

## 2013-07-31 NOTE — Telephone Encounter (Signed)
Pt left v/m requesting rx adderall xr. Call when ready for pick up.

## 2013-07-31 NOTE — Telephone Encounter (Signed)
Left message on voicemail to call back, 

## 2013-08-16 ENCOUNTER — Ambulatory Visit (INDEPENDENT_AMBULATORY_CARE_PROVIDER_SITE_OTHER): Payer: BC Managed Care – PPO | Admitting: Family Medicine

## 2013-08-16 ENCOUNTER — Encounter: Payer: Self-pay | Admitting: Family Medicine

## 2013-08-16 VITALS — BP 135/99 | HR 106 | Temp 98.4°F | Ht 66.5 in | Wt 149.8 lb

## 2013-08-16 DIAGNOSIS — M25312 Other instability, left shoulder: Secondary | ICD-10-CM

## 2013-08-16 DIAGNOSIS — M24819 Other specific joint derangements of unspecified shoulder, not elsewhere classified: Secondary | ICD-10-CM

## 2013-08-16 DIAGNOSIS — H8110 Benign paroxysmal vertigo, unspecified ear: Secondary | ICD-10-CM

## 2013-08-16 MED ORDER — MECLIZINE HCL 25 MG PO TABS: 25.0000 mg | ORAL_TABLET | Freq: Three times a day (TID) | ORAL | Status: DC | PRN

## 2013-08-16 MED ORDER — DIAZEPAM 2 MG PO TABS: 2.0000 mg | ORAL_TABLET | Freq: Four times a day (QID) | ORAL | Status: DC | PRN

## 2013-08-16 MED ORDER — TIZANIDINE HCL 4 MG PO TABS: 4.0000 mg | ORAL_TABLET | Freq: Every evening | ORAL | Status: DC

## 2013-08-16 NOTE — Progress Notes (Signed)
Date:  08/16/2013   Name:  Bradley Schaefer   DOB:  12-Feb-1981   MRN:  [id number] Gender: male Age: 32 y.o.  Primary Physician:  Ruthe Mannan, MD   Chief Complaint: Dizziness and Shoulder Pain   History of Present Illness:  Bradley Schaefer is a 32 y.o. pleasant patient who presents with the following:  Dizzy spells off and on for a while. Was his last day at work, and felt like was dizzy constantly. Next day it was terrible, too. Whole room was spinning, laying down. Just sitting still. Describes true vertigo.  Acute onset vertigo.   Left shoulder, instability. Self-relocates.  At work also.  He describes episodes of dislocation and subluxation going back for more than a decade. He describes having what sounds like an AC sprain as younger man, but later his shoulder has started to anteriorly dislocate. You can feel and see definite altered anatomy. His should has become more unstable over time, and while it may go out for extended seconds to minutes, he has been able to relocate by relaxing and rotating. No numbness. Over time, he has done PT, he is active, and his wife is a PT.   Patient Active Problem List   Diagnosis Date Noted  . Elevated blood pressure 12/29/2011  . LUMBAR STRAIN, ACUTE 11/19/2009  . ALLERGY TO INSECTS AND ARACHNIDS 06/05/2009  . GENITAL HERPES 04/05/2009  . ADHD 04/05/2009  . CELLULITIS 06/11/2008  . INSOMNIA 03/22/2008  . ASTHMA 02/25/2008  . ALLERGY 02/25/2008  . EUSTACHIAN TUBE DYSFUNCTION, BILATERAL 04/26/2007    Past Medical History  Diagnosis Date  . Asthma   . ADD (attention deficit disorder)   . Herpes simplex   . GERD (gastroesophageal reflux disease)   . Varicose veins     No past surgical history on file.  History   Social History  . Marital Status: Married    Spouse Name: N/A    Number of Children: N/A  . Years of Education: N/A   Occupational History  . Not on file.   Social History Main Topics  . Smoking status: Never Smoker     . Smokeless tobacco: Never Used  . Alcohol Use: Yes     Comment: occasionally  . Drug Use: No  . Sexual Activity: Not on file   Other Topics Concern  . Not on file   Social History Narrative  . No narrative on file    No family history on file.  No Known Allergies  Medication list has been reviewed and updated.  Outpatient Prescriptions Prior to Visit  Medication Sig Dispense Refill  . amphetamine-dextroamphetamine (ADDERALL XR) 30 MG 24 hr capsule Take 1 capsule (30 mg total) by mouth every morning.  30 capsule  0  . mometasone (NASONEX) 50 MCG/ACT nasal spray Place 2 sprays into the nose daily.  17 g  12  . Omeprazole-Sodium Bicarbonate (ZEGERID PO) Take by mouth.        . valACYclovir (VALTREX) 1000 MG tablet Take one tablet by mouth one time daily  30 tablet  0  . Fexofenadine HCl (ALLEGRA PO) Take by mouth.         No facility-administered medications prior to visit.    Review of Systems:   GEN: No acute illnesses, no fevers, chills. Vertigo. GI: No n/v/d, eating normally Pulm: No SOB Interactive and getting along well at home.  Otherwise, ROS is as per the HPI.   Physical Examination: BP 135/99  Pulse 106  Temp(Src)  98.4 F (36.9 C) (Oral)  Ht 5' 6.5" (1.689 m)  Wt 149 lb 12 oz (67.926 kg)  BMI 23.81 kg/m2  Ideal Body Weight: Weight in (lb) to have BMI = 25: 156.9   GEN: WDWN, NAD, Non-toxic, A & O x 3 HEENT: Atraumatic, Normocephalic. Neck supple. No masses, No LAD. Ears and Nose: No external deformity. CV: RRR, No M/G/R. No JVD. No thrill. No extra heart sounds. PULM: CTA B, no wheezes, crackles, rhonchi. No retractions. No resp. distress. No accessory muscle use. EXTR: No c/c/e NEURO Normal gait.  PSYCH: Normally interactive. Conversant. Not depressed or anxious appearing.  Calm demeanor.    Shoulder: LEFT Inspection: No muscle wasting. Elevation of L scapula Ecchymosis/edema: neg  AC joint, scapula, clavicle: NT Cervical spine: NT, full  ROM Abduction: full, 5/5 Flexion: full, 5/5 IR, full, lift-off: 5/5 ER at neutral: full, 5/5 AC crossover and compression: mild pain Neer: neg Hawkins: mod pos Drop Test: neg Empty Can: minimally pos Supraspinatus insertion: NT Bicipital groove: mildly tender Sulcus sign: increased laxity but not true positive Apprehension: + O'Brien's: + Jobe Relocation: + Crank: + Load and shift laxity: increased laxity - not taken to extremes, guarding, easily move GH head Scapular dyskinesis: above    Assessment and Plan:  BPV (benign positional vertigo)  Shoulder instability, left  Vertigo, meclizine and valium prn. Should resolve, if not vestib PT may help.   L shoulder instability, repetitive dislocation. Suspect labral tear. Complex question. At his age, I advised consideration of operative intervention given laxity, interference with work and risk for significant risk for [facility] OA at a young age. Recommend Xrays, then MR arthrogram. He will think on it and discuss with Marylene Land.  Orders Today:  No orders of the defined types were placed in this encounter.    Updated Medication List: (Includes new medications, updates to list, dose adjustments) Meds ordered this encounter  Medications  . meclizine (ANTIVERT) 25 MG tablet    Sig: Take 1 tablet (25 mg total) by mouth 3 (three) times daily as needed for dizziness.    Dispense:  40 tablet    Refill:  1  . diazepam (VALIUM) 2 MG tablet    Sig: Take 1 tablet (2 mg total) by mouth every 6 (six) hours as needed.    Dispense:  20 tablet    Refill:  0  . tiZANidine (ZANAFLEX) 4 MG tablet    Sig: Take 1 tablet (4 mg total) by mouth Nightly.    Dispense:  30 tablet    Refill:  3    Medications Discontinued: There are no discontinued medications.    Signed,  Elpidio Galea. Juliah Scadden, MD, CAQ Sports Medicine  [facility] [address] [place] [address] Phone: [phone] Fax: [phone]

## 2013-08-31 ENCOUNTER — Other Ambulatory Visit: Payer: Self-pay | Admitting: Family Medicine

## 2013-09-04 ENCOUNTER — Other Ambulatory Visit: Payer: Self-pay

## 2013-09-04 MED ORDER — AMPHETAMINE-DEXTROAMPHET ER 30 MG PO CP24: 30.0000 mg | ORAL_CAPSULE | ORAL | Status: DC

## 2013-09-04 NOTE — Telephone Encounter (Signed)
Pt left v/m requesting adderall rx. Call when ready for pickup.

## 2013-09-04 NOTE — Telephone Encounter (Signed)
Patient notified that script is up front and ready for pickup. 

## 2013-09-07 ENCOUNTER — Other Ambulatory Visit: Payer: Self-pay

## 2013-10-05 ENCOUNTER — Other Ambulatory Visit: Payer: Self-pay

## 2013-10-05 MED ORDER — AMPHETAMINE-DEXTROAMPHET ER 30 MG PO CP24: 30.0000 mg | ORAL_CAPSULE | ORAL | Status: DC

## 2013-10-05 NOTE — Telephone Encounter (Signed)
Patient notified and Rx placed up front for pick up. 

## 2013-10-05 NOTE — Telephone Encounter (Signed)
Pt left v/m requesting rx adderall XR. Call when ready for pick up.

## 2013-10-05 NOTE — Telephone Encounter (Signed)
Printed and placed in Kim's box. 

## 2013-11-06 ENCOUNTER — Other Ambulatory Visit: Payer: Self-pay

## 2013-11-06 NOTE — Telephone Encounter (Signed)
Pt left v/m requesting rx adderall. Call when ready for pck up.

## 2013-11-07 MED ORDER — AMPHETAMINE-DEXTROAMPHET ER 30 MG PO CP24: 30.0000 mg | ORAL_CAPSULE | ORAL | Status: DC

## 2013-11-07 NOTE — Telephone Encounter (Signed)
Spoke to pt and informed him that Rx is available for pickup at the front desk;pt informed gov't issued photo id required for pickup 

## 2013-11-16 ENCOUNTER — Telehealth: Payer: Self-pay

## 2013-11-16 MED ORDER — ALBUTEROL SULFATE HFA 108 (90 BASE) MCG/ACT IN AERS: 2.0000 | INHALATION_SPRAY | Freq: Four times a day (QID) | RESPIRATORY_TRACT | Status: DC | PRN

## 2013-11-16 NOTE — Telephone Encounter (Signed)
Pt left v/m requesting refill on albuterol inhaler to [facility]; pt said has been long time since used inhaler; not on med list. Pt said recently had cold and has had some wheezing on and off and wanted to get inhaler; pt does not feel like needs appt.Please advise.

## 2013-11-16 NOTE — Telephone Encounter (Signed)
Rx sent 

## 2013-11-16 NOTE — Telephone Encounter (Signed)
Lm on pts vm informing him Rx has been sent to requested pharmacy

## 2013-11-16 NOTE — Addendum Note (Signed)
Addended by: Lucille Passy on: 11/16/2013 10:25 AM   Modules accepted: Orders

## 2013-12-05 ENCOUNTER — Other Ambulatory Visit: Payer: Self-pay | Admitting: Family Medicine

## 2013-12-05 NOTE — Telephone Encounter (Signed)
Ok to print rx and put in my box for signature.

## 2013-12-05 NOTE — Telephone Encounter (Signed)
Pt left voicemail requesting refill of Rx

## 2013-12-06 MED ORDER — AMPHETAMINE-DEXTROAMPHET ER 30 MG PO CP24: 30.0000 mg | ORAL_CAPSULE | ORAL | Status: DC

## 2013-12-06 NOTE — Telephone Encounter (Signed)
Placed in inbox for signature...  Gasper Sells, MA Student

## 2013-12-08 ENCOUNTER — Encounter: Payer: Self-pay | Admitting: Family Medicine

## 2013-12-08 NOTE — Telephone Encounter (Signed)
Spoke to pt and informed him Rx is available for pickup at the front desk;pt informed gov't issued photo id required for pickup

## 2013-12-24 ENCOUNTER — Other Ambulatory Visit: Payer: Self-pay | Admitting: Internal Medicine

## 2013-12-29 ENCOUNTER — Encounter: Payer: Self-pay | Admitting: Family Medicine

## 2014-01-11 ENCOUNTER — Other Ambulatory Visit: Payer: Self-pay

## 2014-01-11 MED ORDER — AMPHETAMINE-DEXTROAMPHET ER 30 MG PO CP24: 30.0000 mg | ORAL_CAPSULE | ORAL | Status: DC

## 2014-01-11 NOTE — Telephone Encounter (Signed)
Pt left v/m requesting rx for Adderall. Call when ready for pick up.  

## 2014-01-11 NOTE — Telephone Encounter (Signed)
Lm on pts vm informing him Rx is available for pickup at the front desk; informed a gov't issued photo id required for pickup

## 2014-01-12 ENCOUNTER — Telehealth: Payer: Self-pay

## 2014-01-12 NOTE — Telephone Encounter (Signed)
Pt said when went to pick up Adderall at [facility] [address] was told needed prior authorization; pt has been out of med for 2 days and request PA expedited. [facility] to fax PA request today.Pt request cb when approved.

## 2014-01-16 NOTE — Telephone Encounter (Signed)
PA paperwork not received as of yet. Will submit for approval once received.

## 2014-02-01 ENCOUNTER — Other Ambulatory Visit: Payer: Self-pay

## 2014-02-01 NOTE — Telephone Encounter (Signed)
Ok to print out and put in my box for signature. 

## 2014-02-01 NOTE — Telephone Encounter (Signed)
Pt left v/m requesting rx for Adderall. Call when ready for pick up. Pt has 8 days left of med but request to pick up on 04/06 or 04/07 due to work schedule.Please advise.

## 2014-02-05 MED ORDER — AMPHETAMINE-DEXTROAMPHET ER 30 MG PO CP24: 30.0000 mg | ORAL_CAPSULE | ORAL | Status: DC

## 2014-02-05 NOTE — Telephone Encounter (Signed)
Pt left note and needs to pick up Adderall rx today. Pt request cb at [phone].

## 2014-02-05 NOTE — Telephone Encounter (Signed)
Spoke to pt and informed him Rx is available for pickup at the front desk; pt informed a gov't issued photo id required for pickup 

## 2014-02-07 ENCOUNTER — Ambulatory Visit: Payer: Self-pay

## 2014-02-07 ENCOUNTER — Other Ambulatory Visit: Payer: Self-pay | Admitting: Occupational Medicine

## 2014-02-07 DIAGNOSIS — M25512 Pain in left shoulder: Secondary | ICD-10-CM

## 2014-02-13 ENCOUNTER — Telehealth: Payer: Self-pay | Admitting: Family Medicine

## 2014-02-13 NOTE — Telephone Encounter (Signed)
Spoke to pt and informed him that the PA was received from [facility] and submitted for approval/denial

## 2014-02-13 NOTE — Telephone Encounter (Signed)
Pt is calling to check on his PA for Adderall. He says he left a mssg yesterday, but I don't see anything notated in chart.  He says [facility] has faxed over a PA for the Adderall, but we haven't gotten it.  He also says his girlfriend dropped one off at the front desk Friday, but we don't have that one either. Pt is completely out of his meds.Please advise patient.   I advised patient per Earl Lagos to ask [facility] to fax it to the fax machine in her area [phone]. Pt requests c/b once we receive it.

## 2014-02-23 ENCOUNTER — Telehealth: Payer: Self-pay | Admitting: Family Medicine

## 2014-02-23 NOTE — Telephone Encounter (Signed)
Calling to see if we've heard anything from his prior auth for Adderall. Please call him and advise. Thank you ° °

## 2014-02-23 NOTE — Telephone Encounter (Signed)
Calling to see if we've heard anything from his prior auth for Adderall. Please call him and advise. Thank you

## 2014-02-26 NOTE — Telephone Encounter (Signed)
Lm on pts vm informing him information has been submitted for approval/denial but a response has not yet been received.

## 2014-02-26 NOTE — Telephone Encounter (Signed)
Waynetta- I couldn't locate anything in your area in reference to this. Do you know anything about it?

## 2014-03-12 ENCOUNTER — Other Ambulatory Visit: Payer: Self-pay

## 2014-03-12 MED ORDER — AMPHETAMINE-DEXTROAMPHET ER 30 MG PO CP24: 30.0000 mg | ORAL_CAPSULE | ORAL | Status: DC

## 2014-03-12 NOTE — Telephone Encounter (Signed)
Lm on pts vm informing him Rx is available for pickup at the front desk;pt informed a govt issued photo id required for pickup

## 2014-03-12 NOTE — Telephone Encounter (Signed)
Pt left v/m requesting rx for Adderall. Call when ready for pick up.  

## 2014-04-09 ENCOUNTER — Other Ambulatory Visit: Payer: Self-pay

## 2014-04-09 MED ORDER — AMPHETAMINE-DEXTROAMPHET ER 30 MG PO CP24: 30.0000 mg | ORAL_CAPSULE | ORAL | Status: DC

## 2014-04-09 NOTE — Telephone Encounter (Signed)
Spoke to pt and informed him Rx is available for pickup. Pt informed ov required for additional refills.

## 2014-04-09 NOTE — Telephone Encounter (Signed)
Pt left v/m requesting rx for Adderall. Call when ready for pick up.  

## 2014-05-03 ENCOUNTER — Ambulatory Visit: Payer: Self-pay | Admitting: Family Medicine

## 2014-05-06 ENCOUNTER — Other Ambulatory Visit: Payer: Self-pay | Admitting: Family Medicine

## 2014-05-07 NOTE — Telephone Encounter (Signed)
Last filled 12/27/13 with 1 refill--pt has an appt with you tomorrow--please advise

## 2014-05-08 ENCOUNTER — Encounter: Payer: Self-pay | Admitting: Family Medicine

## 2014-05-08 ENCOUNTER — Ambulatory Visit (INDEPENDENT_AMBULATORY_CARE_PROVIDER_SITE_OTHER): Payer: BC Managed Care – PPO | Admitting: Family Medicine

## 2014-05-08 VITALS — BP 120/78 | HR 121 | Temp 98.2°F | Ht 67.0 in | Wt 153.2 lb

## 2014-05-08 DIAGNOSIS — F909 Attention-deficit hyperactivity disorder, unspecified type: Secondary | ICD-10-CM

## 2014-05-08 MED ORDER — AMPHETAMINE-DEXTROAMPHET ER 5 MG PO CP24: 5.0000 mg | ORAL_CAPSULE | Freq: Every day | ORAL | Status: DC

## 2014-05-08 MED ORDER — TIZANIDINE HCL 4 MG PO TABS: 4.0000 mg | ORAL_TABLET | Freq: Every evening | ORAL | Status: DC

## 2014-05-08 MED ORDER — AMPHETAMINE-DEXTROAMPHET ER 30 MG PO CP24: 30.0000 mg | ORAL_CAPSULE | ORAL | Status: DC

## 2014-05-08 MED ORDER — AMPHETAMINE-DEXTROAMPHET ER 30 MG PO CP24: 30.0000 mg | ORAL_CAPSULE | Freq: Every day | ORAL | Status: DC

## 2014-05-08 MED ORDER — VALACYCLOVIR HCL 1 G PO TABS: ORAL_TABLET | ORAL | Status: DC

## 2014-05-08 NOTE — Progress Notes (Signed)
33 yo with h/o ADD here for follow up.   Currently taking Adderall 30 mg XL daily.   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  Unfortunately going through a divorce and having more issues with concentration at work.  Denies feeling depressed but he is sad his marriage is over.  He is currently in process of fighting over custody of his son.  Wt Readings from Last 3 Encounters:  05/08/14 153 lb 4 oz (69.514 kg)  08/16/13 149 lb 12 oz (67.926 kg)  06/01/13 149 lb (67.586 kg)   Lab Results  Component Value Date   CHOL 175 06/01/2013   HDL 56.30 06/01/2013   LDLCALC 108* 06/01/2013   TRIG 54.0 06/01/2013   CHOLHDL 3 06/01/2013   Lab Results  Component Value Date   CREATININE 1.0 06/01/2013   Lab Results  Component Value Date   WBC 7.3 04/05/2009   HGB 16.7 04/29/2009   HCT 49.0 04/29/2009   MCV 87.1 04/05/2009   PLT 242.0 04/05/2009      Patient Active Problem List   Diagnosis Date Noted  . Elevated blood pressure 12/29/2011  . ALLERGY TO INSECTS AND ARACHNIDS 06/05/2009  . GENITAL HERPES 04/05/2009  . ADHD 04/05/2009  . CELLULITIS 06/11/2008  . INSOMNIA 03/22/2008  . ASTHMA 02/25/2008  . ALLERGY 02/25/2008  . 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 Asthma   . ADD (attention deficit disorder)   . Herpes simplex   . GERD (gastroesophageal reflux disease)   . Varicose veins    No past surgical history on file. History  Substance Use Topics  . Smoking status: Never Smoker   . Smokeless tobacco: Never Used  . Alcohol Use: Yes     Comment: occasionally   No family history on file. No Known Allergies Current Outpatient Prescriptions on File Prior to Visit  Medication Sig Dispense Refill  . albuterol (PROVENTIL HFA;VENTOLIN HFA) 108 (90 BASE) MCG/ACT inhaler Inhale 2 puffs into the lungs every 6 (six) hours as needed for wheezing or shortness of breath.  1 Inhaler  0  . diazepam (VALIUM) 2 MG  tablet Take 1 tablet (2 mg total) by mouth every 6 (six) hours as needed.  20 tablet  0  . Fexofenadine HCl (ALLEGRA PO) Take by mouth.        . mometasone (NASONEX) 50 MCG/ACT nasal spray Place 2 sprays into the nose daily.  17 g  12  . Omeprazole-Sodium Bicarbonate (ZEGERID PO) Take by mouth.         No current facility-administered medications on file prior to visit.   The PMH, PSH, Social History, Family History, Medications, and allergies have been reviewed in [facility], and have been updated if relevant.    Review of Systems  See HPI   Neuro: Denies headaches, memory loss, visual disturbances, and weakness.   Physical Exam BP 120/78  Pulse 121  Temp(Src) 98.2 F (36.8 C) (Oral)  Ht 5\' 7"  (1.702 m)  Wt 153 lb 4 oz (69.514 kg)  BMI 24.00 kg/m2  SpO2 98%   General: alert, well-developed, well-nourished, and well-hydrated.  Lungs: normal respiratory effort, no intercostal retractions, no accessory muscle use, and normal breath sounds.  Heart: mildly tachycardic, regular rhythm, and no murmur.  Psych: normally interactive, good eye contact, not anxious appearing, and not depressed appearing.   Assessment and Plan:

## 2014-05-08 NOTE — Assessment & Plan Note (Signed)
Deteriorated. >25 minutes spent in face to face time with patient, >50% spent in counselling or coordination of care Likely due to acute stressors/situational. He does want to try an increase dose of his adderall. Will increase dose slightly to 35 mg XL daily. The patient indicates understanding of these issues and agrees with the plan.

## 2014-06-04 ENCOUNTER — Other Ambulatory Visit: Payer: Self-pay

## 2014-06-04 MED ORDER — AMPHETAMINE-DEXTROAMPHET ER 30 MG PO CP24
30.0000 mg | ORAL_CAPSULE | Freq: Every day | ORAL | Status: DC
Start: 2014-06-04 — End: 2014-07-04

## 2014-06-04 MED ORDER — AMPHETAMINE-DEXTROAMPHET ER 5 MG PO CP24: 5.0000 mg | ORAL_CAPSULE | Freq: Every day | ORAL | Status: DC

## 2014-06-04 NOTE — Telephone Encounter (Signed)
Spoke to pt and informed him Rx is available for pickup at the front desk

## 2014-06-04 NOTE — Telephone Encounter (Signed)
Pt left v/m requesting rx for Adderall XR 5 mg and 30 mg. Call when ready for pick up.

## 2014-06-05 ENCOUNTER — Encounter: Payer: Self-pay | Admitting: Family Medicine

## 2014-06-25 ENCOUNTER — Encounter: Payer: Self-pay | Admitting: Family Medicine

## 2014-07-04 ENCOUNTER — Other Ambulatory Visit: Payer: Self-pay | Admitting: Family Medicine

## 2014-07-04 MED ORDER — AMPHETAMINE-DEXTROAMPHET ER 5 MG PO CP24
5.0000 mg | ORAL_CAPSULE | Freq: Every day | ORAL | Status: DC
Start: 2014-07-04 — End: 2014-08-06

## 2014-07-04 MED ORDER — AMPHETAMINE-DEXTROAMPHET ER 30 MG PO CP24: 30.0000 mg | ORAL_CAPSULE | Freq: Every day | ORAL | Status: DC

## 2014-07-04 NOTE — Telephone Encounter (Signed)
Both last filled 06/04/14.  Please call pt when ready for pick up.

## 2014-07-04 NOTE — Telephone Encounter (Signed)
Spoke to pt and informed him Rx is available for pickup at the front desk

## 2014-07-05 ENCOUNTER — Encounter: Payer: Self-pay | Admitting: Family Medicine

## 2014-08-06 ENCOUNTER — Other Ambulatory Visit: Payer: Self-pay

## 2014-08-06 MED ORDER — AMPHETAMINE-DEXTROAMPHET ER 5 MG PO CP24: 5.0000 mg | ORAL_CAPSULE | Freq: Every day | ORAL | Status: DC

## 2014-08-06 MED ORDER — AMPHETAMINE-DEXTROAMPHET ER 30 MG PO CP24: 30.0000 mg | ORAL_CAPSULE | Freq: Every day | ORAL | Status: DC

## 2014-08-06 NOTE — Telephone Encounter (Signed)
Spoke to pt and informed him Rx will be available for pickup at the front desk after 1400.

## 2014-08-06 NOTE — Telephone Encounter (Signed)
Pt left v/m requesting rx for Adderall XR 30 mg and 5 mg. Call when ready for pick up.  

## 2014-08-17 ENCOUNTER — Other Ambulatory Visit: Payer: Self-pay

## 2014-09-06 ENCOUNTER — Other Ambulatory Visit: Payer: Self-pay

## 2014-09-06 MED ORDER — AMPHETAMINE-DEXTROAMPHET ER 30 MG PO CP24: 30.0000 mg | ORAL_CAPSULE | Freq: Every day | ORAL | Status: DC

## 2014-09-06 MED ORDER — AMPHETAMINE-DEXTROAMPHET ER 5 MG PO CP24: 5.0000 mg | ORAL_CAPSULE | Freq: Every day | ORAL | Status: DC

## 2014-09-06 NOTE — Telephone Encounter (Signed)
Pt left v/m requesting rx for Adderall. Call when ready for pick up. Pt request to pick up rx on 09/07/14. Dr Deborra Medina does not return until 09/10/14.Please advise.

## 2014-09-07 NOTE — Telephone Encounter (Signed)
Left message on machine that rx is ready for pick-up, and it will be at our front desk.  

## 2014-10-09 ENCOUNTER — Other Ambulatory Visit: Payer: Self-pay

## 2014-10-09 MED ORDER — AMPHETAMINE-DEXTROAMPHET ER 30 MG PO CP24: 30.0000 mg | ORAL_CAPSULE | Freq: Every day | ORAL | Status: DC

## 2014-10-09 MED ORDER — AMPHETAMINE-DEXTROAMPHET ER 5 MG PO CP24: 5.0000 mg | ORAL_CAPSULE | Freq: Every day | ORAL | Status: DC

## 2014-10-09 NOTE — Telephone Encounter (Signed)
Spoke to pt and informed him Rx is available for pickup from the front desk 

## 2014-10-09 NOTE — Telephone Encounter (Signed)
Pt left v/m requesting rx for Adderall XR 30 mg and 5 mg. Call when ready for pick up.  

## 2014-11-08 ENCOUNTER — Other Ambulatory Visit: Payer: Self-pay

## 2014-11-08 MED ORDER — AMPHETAMINE-DEXTROAMPHET ER 30 MG PO CP24: 30.0000 mg | ORAL_CAPSULE | Freq: Every day | ORAL | Status: DC

## 2014-11-08 MED ORDER — AMPHETAMINE-DEXTROAMPHET ER 5 MG PO CP24: 5.0000 mg | ORAL_CAPSULE | Freq: Every day | ORAL | Status: DC

## 2014-11-08 NOTE — Telephone Encounter (Signed)
Pt left v/m requesting rx for Adderall XR 30 mg and 5 mg. Call when ready for pick up.

## 2014-11-08 NOTE — Telephone Encounter (Signed)
Lm on pts vm and informed him Rx is available for pickup from the front desk 

## 2014-12-11 ENCOUNTER — Other Ambulatory Visit: Payer: Self-pay

## 2014-12-11 MED ORDER — AMPHETAMINE-DEXTROAMPHET ER 30 MG PO CP24: 30.0000 mg | ORAL_CAPSULE | Freq: Every day | ORAL | Status: DC

## 2014-12-11 MED ORDER — AMPHETAMINE-DEXTROAMPHET ER 5 MG PO CP24: 5.0000 mg | ORAL_CAPSULE | Freq: Every day | ORAL | Status: DC

## 2014-12-11 NOTE — Telephone Encounter (Signed)
Lm on pts vm informing him Rx is available for pickup at the front desk. Pt advised third party unable to pickup

## 2014-12-11 NOTE — Telephone Encounter (Signed)
Pt left v/m requesting rx for Adderall XR 30 mg and 5 mg. Call when ready for pick up.Pt last seen 05/08/14.

## 2014-12-13 ENCOUNTER — Encounter: Payer: Self-pay | Admitting: Family Medicine

## 2015-01-01 ENCOUNTER — Encounter: Payer: Self-pay | Admitting: Family Medicine

## 2015-01-09 ENCOUNTER — Other Ambulatory Visit: Payer: Self-pay

## 2015-01-09 MED ORDER — AMPHETAMINE-DEXTROAMPHET ER 30 MG PO CP24
30.0000 mg | ORAL_CAPSULE | Freq: Every day | ORAL | Status: DC
Start: 2015-01-09 — End: 2015-02-13

## 2015-01-09 MED ORDER — AMPHETAMINE-DEXTROAMPHET ER 5 MG PO CP24
5.0000 mg | ORAL_CAPSULE | Freq: Every day | ORAL | Status: DC
Start: 2015-01-09 — End: 2015-02-13

## 2015-01-09 NOTE — Telephone Encounter (Signed)
Pt left v/m requesting rx for Adderall XR 30 mg and 5 mg. Call when ready for pick up.pt last seen 05/08/14 and no future appt scheduled.

## 2015-01-09 NOTE — Telephone Encounter (Signed)
Lm on pts vm informing him Rx is available for pickup from the front desk 

## 2015-02-13 ENCOUNTER — Other Ambulatory Visit: Payer: Self-pay

## 2015-02-13 MED ORDER — AMPHETAMINE-DEXTROAMPHET ER 30 MG PO CP24
30.0000 mg | ORAL_CAPSULE | Freq: Every day | ORAL | Status: DC
Start: 2015-02-13 — End: 2015-03-13

## 2015-02-13 MED ORDER — AMPHETAMINE-DEXTROAMPHET ER 5 MG PO CP24
5.0000 mg | ORAL_CAPSULE | Freq: Every day | ORAL | Status: DC
Start: 2015-02-13 — End: 2015-03-13

## 2015-02-13 NOTE — Telephone Encounter (Signed)
Pt left v/m requesting rx for Adderall 30mg  and 5 mg. Call when ready for pick up. Last rx printed 01/09/15 and last seen 05/08/14.

## 2015-02-13 NOTE — Telephone Encounter (Signed)
Lm on pts vm informing him Rx is available for pickup from the front desk 

## 2015-03-12 ENCOUNTER — Other Ambulatory Visit: Payer: Self-pay | Admitting: Family Medicine

## 2015-03-13 ENCOUNTER — Other Ambulatory Visit: Payer: Self-pay

## 2015-03-13 NOTE — Telephone Encounter (Signed)
Pt left v/m requesting rx for Adderall XR 30 mg and 5 mg. Call when ready for pick up. Pt last seen 05/08/14 and rx last printed 02/13/15.

## 2015-03-14 MED ORDER — AMPHETAMINE-DEXTROAMPHET ER 5 MG PO CP24: 5.0000 mg | ORAL_CAPSULE | Freq: Every day | ORAL | Status: DC

## 2015-03-14 MED ORDER — AMPHETAMINE-DEXTROAMPHET ER 30 MG PO CP24
30.0000 mg | ORAL_CAPSULE | Freq: Every day | ORAL | Status: DC
Start: 2015-03-14 — End: 2015-04-09

## 2015-03-14 NOTE — Telephone Encounter (Signed)
Lm on pts vm informing him Rx is available for pickup from the front desk 

## 2015-03-20 ENCOUNTER — Telehealth: Payer: Self-pay

## 2015-03-20 NOTE — Telephone Encounter (Signed)
Bradley Schaefer with [facility] in [employer] left v/m requesting prior auth for adderall xr. Bradley Schaefer request cb [phone].

## 2015-03-21 NOTE — Telephone Encounter (Signed)
Spoke to pharmacy who states that the pt paid for by pt out of pocket.

## 2015-03-22 NOTE — Telephone Encounter (Addendum)
Ronalee Belts with [facility] in [employer] left v/m requesting status of adderall PA; spoke with Ronalee Belts at [facility] [employer] and advised that on 03/21/15 [facility] CMA was advised pt pd out of pocket for med. Ronalee Belts said he is not aware that pt pd out of pocket and thinks PA still needs to be done.

## 2015-03-22 NOTE — Telephone Encounter (Signed)
Spoke to pt, Bradley Schaefer, who confirms that he has picked up 30mg  Rx from the pharmacy. The alternate 10mg  tab is on back order and and is not available at this time.

## 2015-03-25 ENCOUNTER — Other Ambulatory Visit: Payer: Self-pay | Admitting: Family Medicine

## 2015-03-25 NOTE — Telephone Encounter (Signed)
Last f/u appt 05/2014-CPE 

## 2015-04-04 ENCOUNTER — Encounter: Payer: Self-pay | Admitting: Family Medicine

## 2015-04-04 ENCOUNTER — Ambulatory Visit (INDEPENDENT_AMBULATORY_CARE_PROVIDER_SITE_OTHER): Payer: 59 | Admitting: Family Medicine

## 2015-04-04 VITALS — BP 118/78 | HR 79 | Temp 98.2°F | Ht 66.25 in | Wt 157.8 lb

## 2015-04-04 DIAGNOSIS — F909 Attention-deficit hyperactivity disorder, unspecified type: Secondary | ICD-10-CM

## 2015-04-04 DIAGNOSIS — Z0000 Encounter for general adult medical examination without abnormal findings: Secondary | ICD-10-CM | POA: Insufficient documentation

## 2015-04-04 MED ORDER — TIZANIDINE HCL 4 MG PO TABS: 4.0000 mg | ORAL_TABLET | Freq: Every evening | ORAL | Status: DC

## 2015-04-04 NOTE — Assessment & Plan Note (Signed)
Well controlled on current dose of Adderall.  No changes made today.

## 2015-04-04 NOTE — Progress Notes (Signed)
Pre visit review using our clinic review tool, if applicable. No additional management support is needed unless otherwise documented below in the visit note. 

## 2015-04-04 NOTE — Assessment & Plan Note (Signed)
Reviewed preventive care protocols, scheduled due services, and updated immunizations Discussed nutrition, exercise, diet, and healthy lifestyle.  Declining lab work.

## 2015-04-04 NOTE — Progress Notes (Signed)
34 yo with h/o ADD here for CPX.  Currently taking Adderall 30 mg XL daily.   Feels working well.  Got remarried and just bought a house.  In a good place with his son too.  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     Lab Results  Component Value Date   WBC 7.3 04/05/2009   HGB 16.7 04/29/2009   HCT 49.0 04/29/2009   MCV 87.1 04/05/2009   PLT 242.0 04/05/2009   Lab Results  Component Value Date   NA 138 06/01/2013   K 4.2 06/01/2013   CL 103 06/01/2013   CO2 30 06/01/2013   Lab Results  Component Value Date   CHOL 175 06/01/2013   HDL 56.30 06/01/2013   LDLCALC 108* 06/01/2013   TRIG 54.0 06/01/2013   CHOLHDL 3 06/01/2013   Lab Results  Component Value Date   TSH 1.35 04/05/2009   Lab Results  Component Value Date   CREATININE 1.0 06/01/2013    Wt Readings from Last 3 Encounters:  04/04/15 157 lb 12 oz (71.555 kg)  05/08/14 153 lb 4 oz (69.514 kg)  08/16/13 149 lb 12 oz (67.926 kg)   Lab Results  Component Value Date   CHOL 175 06/01/2013   HDL 56.30 06/01/2013   LDLCALC 108* 06/01/2013   TRIG 54.0 06/01/2013   CHOLHDL 3 06/01/2013   Lab Results  Component Value Date   CREATININE 1.0 06/01/2013   Lab Results  Component Value Date   WBC 7.3 04/05/2009   HGB 16.7 04/29/2009   HCT 49.0 04/29/2009   MCV 87.1 04/05/2009   PLT 242.0 04/05/2009      Patient Active Problem List   Diagnosis Date Noted  . Visit for well man health check 04/04/2015  . GENITAL HERPES 04/05/2009  . Attention deficit hyperactivity disorder (ADHD) 04/05/2009  . INSOMNIA 03/22/2008  . ASTHMA 02/25/2008  . ALLERGY 02/25/2008  . 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 Asthma   . ADD (attention deficit disorder)   . Herpes simplex   . GERD (gastroesophageal reflux disease)   . Varicose veins    No past surgical history on file. History  Substance Use Topics  . Smoking status: Never  Smoker   . Smokeless tobacco: Never Used  . Alcohol Use: Yes     Comment: occasionally   No family history on file. No Known Allergies Current Outpatient Prescriptions on File Prior to Visit  Medication Sig Dispense Refill  . albuterol (PROVENTIL HFA;VENTOLIN HFA) 108 (90 BASE) MCG/ACT inhaler Inhale 2 puffs into the lungs every 6 (six) hours as needed for wheezing or shortness of breath. 1 Inhaler 0  . amphetamine-dextroamphetamine (ADDERALL XR) 30 MG 24 hr capsule Take 1 capsule (30 mg total) by mouth daily. 30 capsule 0  . amphetamine-dextroamphetamine (ADDERALL XR) 5 MG 24 hr capsule Take 1 capsule (5 mg total) by mouth daily. 30 capsule 0  . Fexofenadine HCl (ALLEGRA PO) Take by mouth.      . mometasone (NASONEX) 50 MCG/ACT nasal spray Place 2 sprays into the nose daily. 17 g 12  . Omeprazole-Sodium Bicarbonate (ZEGERID PO) Take by mouth.      Marland Kitchen tiZANidine (ZANAFLEX) 4 MG tablet Take 1 tablet (4 mg total) by mouth Nightly. 30 tablet 3  . valACYclovir (VALTREX) 1000 MG tablet TAKE ONE TABLET BY MOUTH ONE TIME DAILY 30 tablet 1   No current  facility-administered medications on file prior to visit.   The PMH, PSH, Social History, Family History, Medications, and allergies have been reviewed in [facility], and have been updated if relevant.    Review of Systems  Review of Systems  Constitutional: Negative.   HENT: Negative.   Cardiovascular: Negative.   Gastrointestinal: Negative.   Endocrine: Negative.   Genitourinary: Negative.   Musculoskeletal: Negative.   Skin: Negative.   Allergic/Immunologic: Negative.   Neurological: Negative.   Hematological: Negative.   Psychiatric/Behavioral: Negative.   All other systems reviewed and are negative.    Physical Exam BP 118/78 mmHg  Pulse 79  Temp(Src) 98.2 F (36.8 C) (Oral)  Ht 5' 6.25" (1.683 m)  Wt 157 lb 12 oz (71.555 kg)  BMI 25.26 kg/m2  SpO2 98%   Physical Exam  Constitutional: He is oriented to person, place, and time  and well-developed, well-nourished, and in no distress. No distress.  HENT:  Head: Normocephalic.  Eyes: Conjunctivae are normal.  Neck: Normal range of motion.  Cardiovascular: Normal rate and regular rhythm.   Pulmonary/Chest: Effort normal and breath sounds normal. No respiratory distress.  Abdominal: Soft.  Musculoskeletal: He exhibits no edema.  Neurological: He is alert and oriented to person, place, and time.  Skin: Skin is warm and dry.  Psychiatric: Mood, memory, affect and judgment normal.  Nursing note and vitals reviewed.

## 2015-04-09 ENCOUNTER — Other Ambulatory Visit: Payer: Self-pay

## 2015-04-09 NOTE — Telephone Encounter (Signed)
Pt left v/m requesting rx for Adderall XR 30 mg (pt only taking 30 mg now). Call when ready for pick up. Last seen06/02/16 and last printed rx # 30 on 03/14/15.

## 2015-04-10 MED ORDER — AMPHETAMINE-DEXTROAMPHET ER 30 MG PO CP24: 30.0000 mg | ORAL_CAPSULE | Freq: Every day | ORAL | Status: DC

## 2015-04-12 ENCOUNTER — Encounter: Payer: Self-pay | Admitting: Family Medicine

## 2015-04-12 NOTE — Telephone Encounter (Signed)
Spoke to pt and informed him Rx is available for pickup from the front desk. Pt wanting to change pharmacy but advised he must first update contract; otherwise it is considered breeched.

## 2015-05-13 ENCOUNTER — Other Ambulatory Visit: Payer: Self-pay

## 2015-05-13 MED ORDER — VALACYCLOVIR HCL 1 G PO TABS: 1000.0000 mg | ORAL_TABLET | Freq: Every day | ORAL | Status: DC

## 2015-05-13 MED ORDER — AMPHETAMINE-DEXTROAMPHET ER 30 MG PO CP24
30.0000 mg | ORAL_CAPSULE | Freq: Every day | ORAL | Status: DC
Start: 2015-05-13 — End: 2015-06-11

## 2015-05-13 NOTE — Telephone Encounter (Signed)
Left voicemail on patients home phone notifying patient that Rx was ready for pick up.

## 2015-05-13 NOTE — Telephone Encounter (Signed)
Pt left v/m requesting rx for Adderall XR 30 mg(last printed # 30 on 04/10/15) and refill valtrex to [facility] outpt pharmacy.last seen 04/04/2015. Call when ready for pick up.

## 2015-06-11 ENCOUNTER — Other Ambulatory Visit: Payer: Self-pay

## 2015-06-11 NOTE — Telephone Encounter (Signed)
Pt left v/m requesting rx for Adderall. Call when ready for pick up. rx last printed # 30 on 05/13/15 and pt last seen 04/04/2015. Dr Deborra Medina out of office rest of the week.Please advise.

## 2015-06-12 MED ORDER — AMPHETAMINE-DEXTROAMPHET ER 30 MG PO CP24: 30.0000 mg | ORAL_CAPSULE | Freq: Every day | ORAL | Status: DC

## 2015-06-12 NOTE — Telephone Encounter (Signed)
Patient notified and Rx placed up front for pick up. 

## 2015-06-12 NOTE — Telephone Encounter (Signed)
Printed.  Thanks.  

## 2015-07-11 ENCOUNTER — Other Ambulatory Visit: Payer: Self-pay

## 2015-07-11 MED ORDER — AMPHETAMINE-DEXTROAMPHET ER 30 MG PO CP24: 30.0000 mg | ORAL_CAPSULE | Freq: Every day | ORAL | Status: DC

## 2015-07-11 NOTE — Telephone Encounter (Signed)
Pt left v/m requesting rx for Adderall. Call when ready for pick up. rx last printed # 30 on 06/12/15. Last annual exam on 04/04/15.

## 2015-07-11 NOTE — Telephone Encounter (Signed)
Pt notified Rx ready for pickup 

## 2015-08-12 ENCOUNTER — Other Ambulatory Visit: Payer: Self-pay | Admitting: *Deleted

## 2015-08-12 NOTE — Telephone Encounter (Signed)
Last filled #30 07/11/15

## 2015-08-13 MED ORDER — AMPHETAMINE-DEXTROAMPHET ER 30 MG PO CP24: 30.0000 mg | ORAL_CAPSULE | Freq: Every day | ORAL | Status: DC

## 2015-08-13 NOTE — Telephone Encounter (Signed)
Patient notified by telephone that script is up front ready for pickup. 

## 2015-08-29 ENCOUNTER — Other Ambulatory Visit: Payer: Self-pay | Admitting: Family Medicine

## 2015-09-13 ENCOUNTER — Other Ambulatory Visit: Payer: Self-pay

## 2015-09-13 ENCOUNTER — Encounter: Payer: Self-pay | Admitting: Family Medicine

## 2015-09-13 MED ORDER — AMPHETAMINE-DEXTROAMPHET ER 30 MG PO CP24: 30.0000 mg | ORAL_CAPSULE | Freq: Every day | ORAL | Status: DC

## 2015-09-13 NOTE — Telephone Encounter (Signed)
RX printed and signed and given to Waynetta 

## 2015-09-13 NOTE — Telephone Encounter (Signed)
Spoke to pt and informed him Rx is available for pickup from the front desk. Pt advised third party unable to pickup .  

## 2015-09-13 NOTE — Telephone Encounter (Signed)
Pt left v/m requesting rx for Adderall. Call when ready for pick up. rx last printed # 30 on 08/13/15. Last annual exam on 04/04/15.

## 2015-10-02 ENCOUNTER — Ambulatory Visit: Payer: Self-pay

## 2015-10-02 DIAGNOSIS — J309 Allergic rhinitis, unspecified: Secondary | ICD-10-CM

## 2015-10-02 NOTE — Progress Notes (Signed)
Immunotherapy   Patient Details  Name: Kaesyn Stieg MRN: [id number] Date of Birth: 04-28-1981  10/02/2015  Merlon Vozza here to pick up  #2 Red 1:100( CR--[place]) @.10 given Following schedule: A  Frequency:1 time per week  Epi-Pen Avaiable . Pt receives injs at home from wife Consent signed and patient instructions given.    Orpah Greek 10/02/2015, 4:33 PM

## 2015-10-04 ENCOUNTER — Encounter: Payer: Self-pay | Admitting: Family Medicine

## 2015-10-11 ENCOUNTER — Other Ambulatory Visit: Payer: Self-pay

## 2015-10-11 MED ORDER — AMPHETAMINE-DEXTROAMPHET ER 30 MG PO CP24: 30.0000 mg | ORAL_CAPSULE | Freq: Every day | ORAL | Status: DC

## 2015-10-11 NOTE — Telephone Encounter (Signed)
Pt left v/m requesting rx for Adderall. Call when ready for pick up. rx last printed # 30 on 09/13/15. Last annual exam on 04/04/15.

## 2015-10-14 MED ORDER — AMPHETAMINE-DEXTROAMPHET ER 30 MG PO CP24: 30.0000 mg | ORAL_CAPSULE | Freq: Every day | ORAL | Status: DC

## 2015-10-14 NOTE — Telephone Encounter (Signed)
Lm on pts vm informing him Rx is available for pickup from the front desk 

## 2015-10-14 NOTE — Addendum Note (Signed)
Addended by: Modena Nunnery on: 10/14/2015 08:13 AM   Modules accepted: Orders

## 2015-11-13 ENCOUNTER — Other Ambulatory Visit: Payer: Self-pay

## 2015-11-13 MED ORDER — AMPHETAMINE-DEXTROAMPHET ER 30 MG PO CP24: 30.0000 mg | ORAL_CAPSULE | Freq: Every day | ORAL | Status: DC

## 2015-11-13 NOTE — Telephone Encounter (Signed)
Pt left v/m requesting rx for Adderall XR 30 mg. Call when ready for pick up. Last printed # 30 on 10/14/15. Last seen annual 04/04/15.

## 2015-11-13 NOTE — Telephone Encounter (Signed)
Spoke to pt and informed him Rx is available for pickup from the front desk 

## 2015-12-11 ENCOUNTER — Telehealth: Payer: Self-pay

## 2015-12-11 MED ORDER — AMPHETAMINE-DEXTROAMPHET ER 30 MG PO CP24: 30.0000 mg | ORAL_CAPSULE | Freq: Every day | ORAL | Status: DC

## 2015-12-11 NOTE — Telephone Encounter (Signed)
Pt left v/m requesting rx for Adderall. Call when ready for pick up. Last printed # 30 on 11/13/15 and last annual exam on 04/04/15.

## 2015-12-11 NOTE — Telephone Encounter (Signed)
Ok to print and put in my box for signature. 

## 2015-12-12 NOTE — Telephone Encounter (Signed)
Spoke to pt and informed him Rx is available for pickup from the front desk 

## 2015-12-12 NOTE — Telephone Encounter (Signed)
Patient called to find out if his rx is ready for pick up.  Please call patient when rx is ready at  [phone].

## 2015-12-31 DIAGNOSIS — H5213 Myopia, bilateral: Secondary | ICD-10-CM | POA: Diagnosis not present

## 2016-01-09 ENCOUNTER — Other Ambulatory Visit: Payer: Self-pay

## 2016-01-09 MED ORDER — AMPHETAMINE-DEXTROAMPHET ER 30 MG PO CP24: 30.0000 mg | ORAL_CAPSULE | Freq: Every day | ORAL | Status: DC

## 2016-01-09 NOTE — Telephone Encounter (Signed)
Spoke to pt and informed him Rx is available for pickup from the front desk 

## 2016-01-09 NOTE — Telephone Encounter (Signed)
Pt left v/m requesting rx for Adderall. Call when ready for pick up. rx last printed # 30 on 12/11/15; last seen annual 04/04/15.

## 2016-01-20 ENCOUNTER — Encounter: Payer: Self-pay | Admitting: Family Medicine

## 2016-01-20 ENCOUNTER — Other Ambulatory Visit: Payer: Self-pay | Admitting: Family Medicine

## 2016-01-20 MED ORDER — CYCLOBENZAPRINE HCL 5 MG PO TABS: 5.0000 mg | ORAL_TABLET | Freq: Three times a day (TID) | ORAL | Status: DC | PRN

## 2016-01-20 MED FILL — CYCLOBENZAPRINE 5 MG TABLET: 5 | 10 days supply | Qty: 30 | Fill #0 | Status: TO

## 2016-02-05 ENCOUNTER — Encounter: Payer: Self-pay | Admitting: Family Medicine

## 2016-02-05 MED ORDER — AMPHETAMINE-DEXTROAMPHET ER 30 MG PO CP24: 30.0000 mg | ORAL_CAPSULE | Freq: Every day | ORAL | Status: DC

## 2016-02-05 NOTE — Telephone Encounter (Signed)
Lm on pts vm and informed him Rx is available for pickup from the front desk 

## 2016-03-13 ENCOUNTER — Other Ambulatory Visit: Payer: Self-pay

## 2016-03-13 NOTE — Telephone Encounter (Signed)
Pt left v/m requesting rx for Adderall. Call when ready for pick up. Last printed #30 on 02/05/16; last seen 04/04/15 and no future appt scheduled.

## 2016-03-13 NOTE — Telephone Encounter (Signed)
Ok to print and put on my desk for signature. 

## 2016-03-16 MED ORDER — AMPHETAMINE-DEXTROAMPHET ER 30 MG PO CP24: 30.0000 mg | ORAL_CAPSULE | Freq: Every day | ORAL | Status: DC

## 2016-03-16 NOTE — Telephone Encounter (Signed)
Lm on pts vm and informed him Rx is available for pickup from the front desk. Pt advised third party unable to pickup  

## 2016-03-18 ENCOUNTER — Encounter: Payer: Self-pay | Admitting: Family Medicine

## 2016-03-18 DIAGNOSIS — Z79899 Other long term (current) drug therapy: Secondary | ICD-10-CM | POA: Diagnosis not present

## 2016-04-01 ENCOUNTER — Encounter: Payer: Self-pay | Admitting: Family Medicine

## 2016-04-01 ENCOUNTER — Telehealth: Payer: Self-pay | Admitting: Family Medicine

## 2016-04-01 NOTE — Telephone Encounter (Signed)
Pt dropped off pre-participation physical form to be filled out by pcp. I put form in the rx tower. Please call when completed.

## 2016-04-02 ENCOUNTER — Other Ambulatory Visit: Payer: Self-pay | Admitting: Family Medicine

## 2016-04-02 DIAGNOSIS — Z0000 Encounter for general adult medical examination without abnormal findings: Secondary | ICD-10-CM

## 2016-04-02 NOTE — Telephone Encounter (Signed)
Scheduled cpe on 6/14 at 10:30am. He has boy [employer] camp out the following wk, needed appt prior. I left form in Rx tower.

## 2016-04-02 NOTE — Telephone Encounter (Signed)
Apologies, I put form in file up front, not Rx tower.

## 2016-04-07 ENCOUNTER — Other Ambulatory Visit (INDEPENDENT_AMBULATORY_CARE_PROVIDER_SITE_OTHER): Payer: 59

## 2016-04-07 DIAGNOSIS — Z0000 Encounter for general adult medical examination without abnormal findings: Secondary | ICD-10-CM

## 2016-04-07 LAB — COMPREHENSIVE METABOLIC PANEL
ALT: 18 U/L (ref 0–53)
AST: 21 U/L (ref 0–37)
Albumin: 4.3 g/dL (ref 3.5–5.2)
Alkaline Phosphatase: 48 U/L (ref 39–117)
BUN: 9 mg/dL (ref 6–23)
CO2: 30 mEq/L (ref 19–32)
Calcium: 9.4 mg/dL (ref 8.4–10.5)
Chloride: 102 mEq/L (ref 96–112)
Creatinine, Ser: 0.94 mg/dL (ref 0.40–1.50)
GFR: 96.07 mL/min (ref >60.00)
Glucose, Bld: 103 mg/dL — ABNORMAL HIGH (ref 70–99)
Potassium: 4.4 mEq/L (ref 3.5–5.1)
Sodium: 137 mEq/L (ref 135–145)
Total Bilirubin: 0.5 mg/dL (ref 0.2–1.2)
Total Protein: 7.1 g/dL (ref 6.0–8.3)

## 2016-04-07 LAB — LIPID PANEL
Cholesterol: 167 mg/dL (ref 0–200)
HDL: 53 mg/dL (ref >39.00)
LDL Cholesterol: 98 mg/dL (ref 0–99)
NonHDL: 113.51
Total CHOL/HDL Ratio: 3
Triglycerides: 79 mg/dL (ref 0.0–149.0)
VLDL: 15.8 mg/dL (ref 0.0–40.0)

## 2016-04-07 LAB — CBC WITH DIFFERENTIAL/PLATELET
Basophils Absolute: 0 10*3/uL (ref 0.0–0.1)
Basophils Relative: 0.2 % (ref 0.0–3.0)
Eosinophils Absolute: 0 10*3/uL (ref 0.0–0.7)
Eosinophils Relative: 0.3 % (ref 0.0–5.0)
HCT: 48 % (ref 39.0–52.0)
Hemoglobin: 16.5 g/dL (ref 13.0–17.0)
Lymphocytes Relative: 32.7 % (ref 12.0–46.0)
Lymphs Abs: 2.9 10*3/uL (ref 0.7–4.0)
MCHC: 34.5 g/dL (ref 30.0–36.0)
MCV: 88.7 fl (ref 78.0–100.0)
Monocytes Absolute: 0.7 10*3/uL (ref 0.1–1.0)
Monocytes Relative: 8 % (ref 3.0–12.0)
Neutro Abs: 5.2 10*3/uL (ref 1.4–7.7)
Neutrophils Relative %: 58.8 % (ref 43.0–77.0)
Platelets: 274 10*3/uL (ref 150.0–400.0)
RBC: 5.41 Mil/uL (ref 4.22–5.81)
RDW: 13.4 % (ref 11.5–15.5)
WBC: 8.8 10*3/uL (ref 4.0–10.5)

## 2016-04-15 ENCOUNTER — Ambulatory Visit (INDEPENDENT_AMBULATORY_CARE_PROVIDER_SITE_OTHER): Payer: 59 | Admitting: Family Medicine

## 2016-04-15 ENCOUNTER — Encounter: Payer: Self-pay | Admitting: Family Medicine

## 2016-04-15 VITALS — BP 132/92 | HR 94 | Temp 98.0°F | Ht 66.75 in | Wt 162.5 lb

## 2016-04-15 DIAGNOSIS — Z0000 Encounter for general adult medical examination without abnormal findings: Secondary | ICD-10-CM | POA: Diagnosis not present

## 2016-04-15 DIAGNOSIS — Z23 Encounter for immunization: Secondary | ICD-10-CM

## 2016-04-15 DIAGNOSIS — F900 Attention-deficit hyperactivity disorder, predominantly inattentive type: Secondary | ICD-10-CM

## 2016-04-15 MED ORDER — AMPHETAMINE-DEXTROAMPHET ER 30 MG PO CP24: 30.0000 mg | ORAL_CAPSULE | Freq: Every day | ORAL | Status: DC

## 2016-04-15 NOTE — Assessment & Plan Note (Signed)
Reviewed preventive care protocols, scheduled due services, and updated immunizations Discussed nutrition, exercise, diet, and healthy lifestyle.  

## 2016-04-15 NOTE — Progress Notes (Signed)
35 yo with h/o ADD here for CPX.  Currently taking Adderall 30 mg XL daily.   Feels working well.   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     Lab Results  Component Value Date   WBC 8.8 04/07/2016   HGB 16.5 04/07/2016   HCT 48.0 04/07/2016   MCV 88.7 04/07/2016   PLT 274.0 04/07/2016   Lab Results  Component Value Date   NA 137 04/07/2016   K 4.4 04/07/2016   CL 102 04/07/2016   CO2 30 04/07/2016   Lab Results  Component Value Date   CHOL 167 04/07/2016   HDL 53.00 04/07/2016   LDLCALC 98 04/07/2016   TRIG 79.0 04/07/2016   CHOLHDL 3 04/07/2016   Lab Results  Component Value Date   TSH 1.35 04/05/2009   Lab Results  Component Value Date   CREATININE 0.94 04/07/2016    Wt Readings from Last 3 Encounters:  04/15/16 162 lb 8 oz (73.71 kg)  04/04/15 157 lb 12 oz (71.555 kg)  05/08/14 153 lb 4 oz (69.514 kg)   Lab Results  Component Value Date   CHOL 167 04/07/2016   HDL 53.00 04/07/2016   LDLCALC 98 04/07/2016   TRIG 79.0 04/07/2016   CHOLHDL 3 04/07/2016   Lab Results  Component Value Date   CREATININE 0.94 04/07/2016   Lab Results  Component Value Date   WBC 8.8 04/07/2016   HGB 16.5 04/07/2016   HCT 48.0 04/07/2016   MCV 88.7 04/07/2016   PLT 274.0 04/07/2016      Patient Active Problem List   Diagnosis Date Noted  . Visit for well man health check 04/04/2015  . GENITAL HERPES 04/05/2009  . Attention deficit hyperactivity disorder (ADHD) 04/05/2009  . INSOMNIA 03/22/2008  . ASTHMA 02/25/2008  . ALLERGY 02/25/2008  . 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 Asthma   . ADD (attention deficit disorder)   . Herpes simplex   . GERD (gastroesophageal reflux disease)   . Varicose veins    No past surgical history on file. Social History  Substance Use Topics  . Smoking status: Never Smoker   . Smokeless tobacco: Never Used  . Alcohol Use: Yes   Comment: occasionally   No family history on file. No Known Allergies Current Outpatient Prescriptions on File Prior to Visit  Medication Sig Dispense Refill  . albuterol (PROVENTIL HFA;VENTOLIN HFA) 108 (90 BASE) MCG/ACT inhaler Inhale 2 puffs into the lungs every 6 (six) hours as needed for wheezing or shortness of breath. 1 Inhaler 0  . cyclobenzaprine (FLEXERIL) 5 MG tablet Take 1 tablet (5 mg total) by mouth 3 (three) times daily as needed for muscle spasms. 30 tablet 1  . Fexofenadine HCl (ALLEGRA PO) Take by mouth.      . mometasone (NASONEX) 50 MCG/ACT nasal spray Place 2 sprays into the nose daily. 17 g 12  . Omeprazole-Sodium Bicarbonate (ZEGERID PO) Take by mouth.      . valACYclovir (VALTREX) 1000 MG tablet TAKE 1 TABLET BY MOUTH ONCE DAILY 30 tablet 7   No current facility-administered medications on file prior to visit.   The PMH, PSH, Social History, Family History, Medications, and allergies have been reviewed in [facility], and have been updated if relevant.    Review of Systems  Review of Systems  Constitutional: Negative.   HENT: Negative.   Cardiovascular: Negative.   Gastrointestinal: Negative.  Endocrine: Negative.   Genitourinary: Negative.   Musculoskeletal: Negative.   Skin: Negative.   Allergic/Immunologic: Negative.   Neurological: Negative.   Hematological: Negative.   Psychiatric/Behavioral: Negative.   All other systems reviewed and are negative.    Physical Exam BP 132/92 mmHg  Pulse 94  Temp(Src) 98 F (36.7 C) (Oral)  Ht 5' 6.75" (1.695 m)  Wt 162 lb 8 oz (73.71 kg)  BMI 25.66 kg/m2  SpO2 98%   Physical Exam  Constitutional: He is oriented to person, place, and time and well-developed, well-nourished, and in no distress. No distress.  HENT:  Head: Normocephalic.  Eyes: Conjunctivae are normal.  Neck: Normal range of motion.  Cardiovascular: Normal rate and regular rhythm.   Pulmonary/Chest: Effort normal and breath sounds normal. No  respiratory distress.  Abdominal: Soft.  Musculoskeletal: He exhibits no edema.  Neurological: He is alert and oriented to person, place, and time.  Skin: Skin is warm and dry.  Psychiatric: Mood, memory, affect and judgment normal.  Nursing note and vitals reviewed.

## 2016-04-15 NOTE — Progress Notes (Signed)
Pre visit review using our clinic review tool, if applicable. No additional management support is needed unless otherwise documented below in the visit note. 

## 2016-04-29 ENCOUNTER — Encounter: Payer: Self-pay | Admitting: Family Medicine

## 2016-04-29 ENCOUNTER — Other Ambulatory Visit: Payer: Self-pay | Admitting: Family Medicine

## 2016-04-29 MED ORDER — DOXYCYCLINE HYCLATE 100 MG PO TABS: 100.0000 mg | ORAL_TABLET | Freq: Two times a day (BID) | ORAL | Status: DC

## 2016-05-12 ENCOUNTER — Other Ambulatory Visit: Payer: Self-pay

## 2016-05-12 MED ORDER — AMPHETAMINE-DEXTROAMPHET ER 30 MG PO CP24: 30.0000 mg | ORAL_CAPSULE | Freq: Every day | ORAL | Status: DC

## 2016-05-12 NOTE — Telephone Encounter (Signed)
Pt left v/m requesting rx for Adderall. Call when ready for pick up.pt last seen and rx last printed # 30 on 04/15/16.

## 2016-05-12 NOTE — Telephone Encounter (Signed)
Lm on pts vm informing him Rx is available for pickup from the front desk 

## 2016-06-15 ENCOUNTER — Other Ambulatory Visit: Payer: Self-pay

## 2016-06-15 MED ORDER — AMPHETAMINE-DEXTROAMPHET ER 30 MG PO CP24: 30.0000 mg | ORAL_CAPSULE | Freq: Every day | ORAL | 0 refills | Status: DC

## 2016-06-15 NOTE — Telephone Encounter (Signed)
Pt left v/m requesting rx for Adderall. Call when ready for pick up. Last printed # 30 on 05/12/16; last annual on 04/15/16.

## 2016-06-15 NOTE — Telephone Encounter (Signed)
Spoke to pt and informed him Rx is available for pickup at the front esk

## 2016-07-03 ENCOUNTER — Encounter: Payer: Self-pay | Admitting: Family Medicine

## 2016-07-03 MED ORDER — ESOMEPRAZOLE MAGNESIUM 40 MG PO CPDR: 40.0000 mg | DELAYED_RELEASE_CAPSULE | Freq: Every day | ORAL | 1 refills | Status: DC

## 2016-07-03 MED ORDER — TIZANIDINE HCL 4 MG PO CAPS: 4.0000 mg | ORAL_CAPSULE | Freq: Three times a day (TID) | ORAL | 0 refills | Status: DC | PRN

## 2016-07-13 ENCOUNTER — Other Ambulatory Visit: Payer: Self-pay | Admitting: Family Medicine

## 2016-07-13 MED ORDER — AMPHETAMINE-DEXTROAMPHET ER 30 MG PO CP24: 30.0000 mg | ORAL_CAPSULE | Freq: Every day | ORAL | 0 refills | Status: DC

## 2016-07-13 NOTE — Telephone Encounter (Signed)
Spoke to pt and informed him Rx is available for pickup from the front desk 

## 2016-07-13 NOTE — Telephone Encounter (Signed)
Last f/u 04/2016 

## 2016-08-04 ENCOUNTER — Other Ambulatory Visit: Payer: Self-pay | Admitting: Family Medicine

## 2016-08-10 ENCOUNTER — Encounter: Payer: Self-pay | Admitting: Family Medicine

## 2016-08-10 ENCOUNTER — Other Ambulatory Visit: Payer: Self-pay | Admitting: Family Medicine

## 2016-08-10 MED ORDER — AMPHETAMINE-DEXTROAMPHET ER 30 MG PO CP24: 30.0000 mg | ORAL_CAPSULE | Freq: Every day | ORAL | 0 refills | Status: DC

## 2016-08-10 NOTE — Telephone Encounter (Signed)
Last f/u 04/2016 

## 2016-08-10 NOTE — Telephone Encounter (Signed)
Spoke to pt and informed him Rx is available for pickup from the front desk 

## 2016-09-14 ENCOUNTER — Encounter: Payer: Self-pay | Admitting: Family Medicine

## 2016-09-14 MED ORDER — AMPHETAMINE-DEXTROAMPHET ER 30 MG PO CP24: 30.0000 mg | ORAL_CAPSULE | Freq: Every day | ORAL | 0 refills | Status: DC

## 2016-09-14 NOTE — Telephone Encounter (Signed)
Spoke to pt and informed him Rx is available for pickup from the front desk. Pt advised third party unable to pickup .  

## 2016-09-14 NOTE — Telephone Encounter (Signed)
Last f/u 04/2016-CPE 

## 2016-09-15 ENCOUNTER — Other Ambulatory Visit: Payer: Self-pay | Admitting: Internal Medicine

## 2016-09-15 ENCOUNTER — Encounter: Payer: Self-pay | Admitting: Family Medicine

## 2016-09-16 ENCOUNTER — Encounter: Payer: Self-pay | Admitting: Family Medicine

## 2016-09-16 DIAGNOSIS — Z79899 Other long term (current) drug therapy: Secondary | ICD-10-CM | POA: Diagnosis not present

## 2016-10-09 ENCOUNTER — Encounter: Payer: Self-pay | Admitting: Family Medicine

## 2016-10-09 MED ORDER — AMPHETAMINE-DEXTROAMPHET ER 30 MG PO CP24: 30.0000 mg | ORAL_CAPSULE | Freq: Every day | ORAL | 0 refills | Status: DC

## 2016-10-09 NOTE — Telephone Encounter (Signed)
Last f/u 04/2016-CPE 

## 2016-10-12 MED ORDER — AMPHETAMINE-DEXTROAMPHET ER 30 MG PO CP24: 30.0000 mg | ORAL_CAPSULE | Freq: Every day | ORAL | 0 refills | Status: DC

## 2016-10-12 NOTE — Addendum Note (Signed)
Addended by: Modena Nunnery on: 10/12/2016 08:48 AM   Modules accepted: Orders

## 2016-10-12 NOTE — Telephone Encounter (Signed)
LM on pts vm and informed him Rx is available for pickup from the desk

## 2016-11-10 ENCOUNTER — Ambulatory Visit (INDEPENDENT_AMBULATORY_CARE_PROVIDER_SITE_OTHER): Payer: 59 | Admitting: Family Medicine

## 2016-11-10 ENCOUNTER — Encounter: Payer: Self-pay | Admitting: Family Medicine

## 2016-11-10 DIAGNOSIS — M549 Dorsalgia, unspecified: Secondary | ICD-10-CM

## 2016-11-10 MED ORDER — AMPHETAMINE-DEXTROAMPHET ER 30 MG PO CP24: 30.0000 mg | ORAL_CAPSULE | Freq: Every day | ORAL | 0 refills | Status: DC

## 2016-11-10 MED ORDER — TIZANIDINE HCL 4 MG PO CAPS: 4.0000 mg | ORAL_CAPSULE | Freq: Three times a day (TID) | ORAL | 0 refills | Status: DC | PRN

## 2016-11-10 MED ORDER — CYCLOBENZAPRINE HCL 5 MG PO TABS: 5.0000 mg | ORAL_TABLET | Freq: Every day | ORAL | 0 refills | Status: DC | PRN

## 2016-11-10 NOTE — Patient Instructions (Signed)
Low Back Sprain Rehab  Ask your health care provider which exercises are safe for you. Do exercises exactly as told by your health care provider and adjust them as directed. It is normal to feel mild stretching, pulling, tightness, or discomfort as you do these exercises, but you should stop right away if you feel sudden pain or your pain gets worse. Do not begin these exercises until told by your health care provider.  Stretching and range of motion exercises  These exercises warm up your muscles and joints and improve the movement and flexibility of your back. These exercises also help to relieve pain, numbness, and tingling.  Exercise A: Lumbar rotation     1. Lie on your back on a firm surface and bend your knees.  2. Straighten your arms out to your sides so each arm forms an "L" shape with a side of your body (a 90 degree angle).  3. Slowly move both of your knees to one side of your body until you feel a stretch in your lower back. Try not to let your shoulders move off of the floor.  4. Hold for __________ seconds.  5. Tense your abdominal muscles and slowly move your knees back to the starting position.  6. Repeat this exercise on the other side of your body.  Repeat __________ times. Complete this exercise __________ times a day.  Exercise B: Prone extension on elbows     1. Lie on your abdomen on a firm surface.  2. Prop yourself up on your elbows.  3. Use your arms to help lift your chest up until you feel a gentle stretch in your abdomen and your lower back.  ? This will place some of your body weight on your elbows. If this is uncomfortable, try stacking pillows under your chest.  ? Your hips should stay down, against the surface that you are lying on. Keep your hip and back muscles relaxed.  4. Hold for __________ seconds.  5. Slowly relax your upper body and return to the starting position.  Repeat __________ times. Complete this exercise __________ times a day.  Strengthening exercises  These  exercises build strength and endurance in your back. Endurance is the ability to use your muscles for a long time, even after they get tired.  Exercise C: Pelvic tilt   1. Lie on your back on a firm surface. Bend your knees and keep your feet flat.  2. Tense your abdominal muscles. Tip your pelvis up toward the ceiling and flatten your lower back into the floor.  ? To help with this exercise, you may place a small towel under your lower back and try to push your back into the towel.  3. Hold for __________ seconds.  4. Let your muscles relax completely before you repeat this exercise.  Repeat __________ times. Complete this exercise __________ times a day.  Exercise D: Alternating arm and leg raises     1. Get on your hands and knees on a firm surface. If you are on a hard floor, you may want to use padding to cushion your knees, such as an exercise mat.  2. Line up your arms and legs. Your hands should be below your shoulders, and your knees should be below your hips.  3. Lift your left leg behind you. At the same time, raise your right arm and straighten it in front of you.  ? Do not lift your leg higher than your hip.  ? Do   not lift your arm higher than your shoulder.  ? Keep your abdominal and back muscles tight.  ? Keep your hips facing the ground.  ? Do not arch your back.  ? Keep your balance carefully, and do not hold your breath.  4. Hold for __________ seconds.  5. Slowly return to the starting position and repeat with your right leg and your left arm.  Repeat __________ times. Complete this exercise __________ times a day.  Exercise E: Abdominal set with straight leg raise     1. Lie on your back on a firm surface.  2. Bend one of your knees and keep your other leg straight.  3. Tense your abdominal muscles and lift your straight leg up, 4-6 inches (10-15 cm) off the ground.  4. Keep your abdominal muscles tight and hold for __________ seconds.  ? Do not hold your breath.  ? Do not arch your back. Keep it  flat against the ground.  5. Keep your abdominal muscles tense as you slowly lower your leg back to the starting position.  6. Repeat with your other leg.  Repeat __________ times. Complete this exercise __________ times a day.  Posture and body mechanics     Body mechanics refers to the movements and positions of your body while you do your daily activities. Posture is part of body mechanics. Good posture and healthy body mechanics can help to relieve stress in your body's tissues and joints. Good posture means that your spine is in its natural S-curve position (your spine is neutral), your shoulders are pulled back slightly, and your head is not tipped forward. The following are general guidelines for applying improved posture and body mechanics to your everyday activities.  Standing     · When standing, keep your spine neutral and your feet about hip-width apart. Keep a slight bend in your knees. Your ears, shoulders, and hips should line up.  · When you do a task in which you stand in one place for a long time, place one foot up on a stable object that is 2-4 inches (5-10 cm) high, such as a footstool. This helps keep your spine neutral.  Sitting     · When sitting, keep your spine neutral and keep your feet flat on the floor. Use a footrest, if necessary, and keep your thighs parallel to the floor. Avoid rounding your shoulders, and avoid tilting your head forward.  · When working at a desk or a computer, keep your desk at a height where your hands are slightly lower than your elbows. Slide your chair under your desk so you are close enough to maintain good posture.  · When working at a computer, place your monitor at a height where you are looking straight ahead and you do not have to tilt your head forward or downward to look at the screen.  Resting     · When lying down and resting, avoid positions that are most painful for you.  · If you have pain with activities such as sitting, bending, stooping, or  squatting (flexion-based activities), lie in a position in which your body does not bend very much. For example, avoid curling up on your side with your arms and knees near your chest (fetal position).  · If you have pain with activities such as standing for a long time or reaching with your arms (extension-based activities), lie with your spine in a neutral position and bend your knees slightly. Try the following   positions:  · Lying on your side with a pillow between your knees.  · Lying on your back with a pillow under your knees.  Lifting     · When lifting objects, keep your feet at least shoulder-width apart and tighten your abdominal muscles.  · Bend your knees and hips and keep your spine neutral. It is important to lift using the strength of your legs, not your back. Do not lock your knees straight out.  · Always ask for help to lift heavy or awkward objects.  This information is not intended to replace advice given to you by your health care provider. Make sure you discuss any questions you have with your health care provider.  Document Released: 10/19/2005 Document Revised: 06/25/2016 Document Reviewed: 07/31/2015  Elsevier Interactive Patient Education © 2017 [employer]

## 2016-11-10 NOTE — Progress Notes (Signed)
Pre visit review using our clinic review tool, if applicable. No additional management support is needed unless otherwise documented below in the visit note. 

## 2016-11-10 NOTE — Progress Notes (Signed)
SUBJECTIVE:  Bradley Schaefer is a 36 y.o. male who complains of low back pain for 4 day(s), positional with bending or lifting, without radiation down the legs. Precipitating factors: recent heavy lifting. Prior history of back problems: recurrent self limited episodes of low back pain in the past. There is no numbness in the legs.  Current Outpatient Prescriptions on File Prior to Visit  Medication Sig Dispense Refill  . albuterol (PROVENTIL HFA;VENTOLIN HFA) 108 (90 BASE) MCG/ACT inhaler Inhale 2 puffs into the lungs every 6 (six) hours as needed for wheezing or shortness of breath. 1 Inhaler 0  . esomeprazole (NEXIUM) 40 MG capsule TAKE 1 CAPSULE BY MOUTH DAILY AT 12 NOON 30 capsule 1  . Fexofenadine HCl (ALLEGRA PO) Take by mouth.      . mometasone (NASONEX) 50 MCG/ACT nasal spray Place 2 sprays into the nose daily. 17 g 12  . tiZANidine (ZANAFLEX) 4 MG capsule Take 1 capsule (4 mg total) by mouth 3 (three) times daily as needed for muscle spasms. 30 capsule 0  . valACYclovir (VALTREX) 1000 MG tablet TAKE 1 TABLET BY MOUTH ONCE DAILY 30 tablet 6   No current facility-administered medications on file prior to visit.     No Known Allergies  Past Medical History:  Diagnosis Date  . ADD (attention deficit disorder)   . Asthma   . GERD (gastroesophageal reflux disease)   . Herpes simplex   . Varicose veins     No past surgical history on file.  No family history on file.  Social History   Social History  . Marital status: Married    Spouse name: N/A  . Number of children: N/A  . Years of education: N/A   Occupational History  . Not on file.   Social History Main Topics  . Smoking status: Never Smoker  . Smokeless tobacco: Never Used  . Alcohol use Yes     Comment: occasionally  . Drug use: No  . Sexual activity: Not on file   Other Topics Concern  . Not on file   Social History Narrative  . No narrative on file   The PMH, PSH, Social History, Family History,  Medications, and allergies have been reviewed in [facility], and have been updated if relevant.  OBJECTIVE:   BP 130/84   Pulse 94   Temp 98.1 F (36.7 C) (Oral)   Wt 171 lb 12 oz (77.9 kg)   SpO2 99%   BMI 27.10 kg/m   Patient appears to be in mild to moderate pain, antalgic gait noted. Lumbosacral spine area reveals no local tenderness or mass.  Painful and reduced LS ROM noted. Straight leg raise is negative at 90 degrees on bilateral. DTR's, motor strength and sensation normal, including heel and toe gait.  Peripheral pulses are palpable. X-Ray: not indicated.  ASSESSMENT:  lumbar strain  PLAN: For acute pain, rest, intermittent application of heat (do not sleep on heating pad), analgesics and muscle relaxants are recommended. Discussed longer term treatment plan of prn NSAID's and discussed a home back care exercise program with flexion exercise routine. Proper lifting with avoidance of heavy lifting discussed. Consider Physical Therapy and XRay studies if not improving. Call or return to clinic prn if these symptoms worsen or fail to improve as anticipated.

## 2016-12-10 ENCOUNTER — Other Ambulatory Visit: Payer: Self-pay | Admitting: Family Medicine

## 2016-12-10 ENCOUNTER — Encounter: Payer: Self-pay | Admitting: Family Medicine

## 2016-12-10 MED ORDER — AMPHETAMINE-DEXTROAMPHET ER 30 MG PO CP24: 30.0000 mg | ORAL_CAPSULE | Freq: Every day | ORAL | 0 refills | Status: DC

## 2016-12-10 NOTE — Telephone Encounter (Signed)
Last f/u 04/2016-CPE 

## 2017-01-06 DIAGNOSIS — H52221 Regular astigmatism, right eye: Secondary | ICD-10-CM | POA: Diagnosis not present

## 2017-01-06 DIAGNOSIS — H5213 Myopia, bilateral: Secondary | ICD-10-CM | POA: Diagnosis not present

## 2017-01-11 ENCOUNTER — Encounter: Payer: Self-pay | Admitting: Family Medicine

## 2017-01-11 MED ORDER — AMPHETAMINE-DEXTROAMPHET ER 30 MG PO CP24: 30.0000 mg | ORAL_CAPSULE | Freq: Every day | ORAL | 0 refills | Status: DC

## 2017-01-11 NOTE — Telephone Encounter (Signed)
Last written 12-10-16 #30 Last OV discussed 04-15-16 Last OV Acute 11-10-16 No Future OV

## 2017-02-08 ENCOUNTER — Encounter: Payer: Self-pay | Admitting: Family Medicine

## 2017-02-09 MED ORDER — AMPHETAMINE-DEXTROAMPHET ER 30 MG PO CP24: 30.0000 mg | ORAL_CAPSULE | Freq: Every day | ORAL | 0 refills | Status: DC

## 2017-02-09 NOTE — Telephone Encounter (Signed)
LAst printed 01-11-17 #30. Last OV Acute 11-10-16 Acute Back Pain No Future OV

## 2017-02-22 DIAGNOSIS — D225 Melanocytic nevi of trunk: Secondary | ICD-10-CM | POA: Diagnosis not present

## 2017-02-22 DIAGNOSIS — L818 Other specified disorders of pigmentation: Secondary | ICD-10-CM | POA: Diagnosis not present

## 2017-02-22 DIAGNOSIS — Z1283 Encounter for screening for malignant neoplasm of skin: Secondary | ICD-10-CM | POA: Diagnosis not present

## 2017-02-22 DIAGNOSIS — D2262 Melanocytic nevi of left upper limb, including shoulder: Secondary | ICD-10-CM | POA: Diagnosis not present

## 2017-02-22 DIAGNOSIS — L218 Other seborrheic dermatitis: Secondary | ICD-10-CM | POA: Diagnosis not present

## 2017-02-22 DIAGNOSIS — D485 Neoplasm of uncertain behavior of skin: Secondary | ICD-10-CM | POA: Diagnosis not present

## 2017-02-23 DIAGNOSIS — Z3009 Encounter for other general counseling and advice on contraception: Secondary | ICD-10-CM | POA: Diagnosis not present

## 2017-03-11 ENCOUNTER — Encounter: Payer: Self-pay | Admitting: Family Medicine

## 2017-03-11 MED ORDER — AMPHETAMINE-DEXTROAMPHET ER 30 MG PO CP24: 30.0000 mg | ORAL_CAPSULE | Freq: Every day | ORAL | 0 refills | Status: DC

## 2017-03-11 NOTE — Telephone Encounter (Signed)
Yes ok to refill and I would like him to schedule a CPE in June.

## 2017-03-11 NOTE — Telephone Encounter (Signed)
Last filled 02-09-17 #30 Last OV 04-15-16 No Future OV. Should we have him schedule a CPE in June for his next refill?

## 2017-04-07 ENCOUNTER — Encounter: Payer: Self-pay | Admitting: Family Medicine

## 2017-04-08 ENCOUNTER — Other Ambulatory Visit: Payer: Self-pay

## 2017-04-08 MED ORDER — AMPHETAMINE-DEXTROAMPHET ER 30 MG PO CP24: 30.0000 mg | ORAL_CAPSULE | Freq: Every day | ORAL | 0 refills | Status: DC

## 2017-04-09 ENCOUNTER — Encounter: Payer: Self-pay | Admitting: Family Medicine

## 2017-04-09 DIAGNOSIS — Z79899 Other long term (current) drug therapy: Secondary | ICD-10-CM | POA: Diagnosis not present

## 2017-04-12 ENCOUNTER — Other Ambulatory Visit: Payer: Self-pay | Admitting: Family Medicine

## 2017-04-12 ENCOUNTER — Encounter: Payer: Self-pay | Admitting: Family Medicine

## 2017-04-12 MED ORDER — AMOXICILLIN 875 MG PO TABS: 875.0000 mg | ORAL_TABLET | Freq: Two times a day (BID) | ORAL | 0 refills | Status: DC

## 2017-04-19 ENCOUNTER — Ambulatory Visit (INDEPENDENT_AMBULATORY_CARE_PROVIDER_SITE_OTHER): Payer: 59 | Admitting: Family Medicine

## 2017-04-19 ENCOUNTER — Encounter: Payer: Self-pay | Admitting: Family Medicine

## 2017-04-19 VITALS — BP 128/80 | HR 82 | Temp 98.2°F | Resp 14 | Ht 67.0 in | Wt 168.2 lb

## 2017-04-19 DIAGNOSIS — F900 Attention-deficit hyperactivity disorder, predominantly inattentive type: Secondary | ICD-10-CM

## 2017-04-19 MED ORDER — TIZANIDINE HCL 4 MG PO CAPS: 4.0000 mg | ORAL_CAPSULE | Freq: Three times a day (TID) | ORAL | 0 refills | Status: DC | PRN

## 2017-04-19 MED ORDER — AMPHETAMINE-DEXTROAMPHET ER 30 MG PO CP24: 30.0000 mg | ORAL_CAPSULE | Freq: Every day | ORAL | 0 refills | Status: DC

## 2017-04-19 NOTE — Assessment & Plan Note (Signed)
>  15 minutes spent in face to face time with patient, >50% spent in counselling or coordination of care. Well controlled on current dose of adderall.  Rx printed and given to pt.

## 2017-04-19 NOTE — Progress Notes (Signed)
Pre visit review using our clinic review tool, if applicable. No additional management support is needed unless otherwise documented below in the visit note. 

## 2017-04-19 NOTE — Progress Notes (Signed)
Subjective:   Patient ID: Bradley Schaefer, male    DOB: 02/25/1981, 36 y.o.   MRN: [id number]  Bradley Schaefer is a pleasant 36 y.o. year old male who presents to clinic today with Follow-up (discuss meds)  on 04/19/2017  HPI:  ADD- has been doing well on current dose of Adderall- 30 mg XR daily.  Concentrating well at home and at work.  Sleeping well.  UDS done last week- neg.   Current Outpatient Prescriptions on File Prior to Visit  Medication Sig Dispense Refill  . albuterol (PROVENTIL HFA;VENTOLIN HFA) 108 (90 BASE) MCG/ACT inhaler Inhale 2 puffs into the lungs every 6 (six) hours as needed for wheezing or shortness of breath. 1 Inhaler 0  . cyclobenzaprine (FLEXERIL) 5 MG tablet Take 1 tablet (5 mg total) by mouth daily as needed for muscle spasms. 30 tablet 0  . esomeprazole (NEXIUM) 40 MG capsule TAKE 1 CAPSULE BY MOUTH DAILY AT 12 NOON 90 capsule 2  . valACYclovir (VALTREX) 1000 MG tablet TAKE 1 TABLET BY MOUTH ONCE DAILY 30 tablet 6  . Fexofenadine HCl (ALLEGRA PO) Take by mouth.      . mometasone (NASONEX) 50 MCG/ACT nasal spray Place 2 sprays into the nose daily. (Patient not taking: Reported on 04/19/2017) 17 g 12   No current facility-administered medications on file prior to visit.     No Known Allergies  Past Medical History:  Diagnosis Date  . ADD (attention deficit disorder)   . Asthma   . GERD (gastroesophageal reflux disease)   . Herpes simplex   . Varicose veins     No past surgical history on file.  No family history on file.  Social History   Social History  . Marital status: Married    Spouse name: N/A  . Number of children: N/A  . Years of education: N/A   Occupational History  . Not on file.   Social History Main Topics  . Smoking status: Never Smoker  . Smokeless tobacco: Never Used  . Alcohol use Yes     Comment: occasionally  . Drug use: No  . Sexual activity: Not on file   Other Topics Concern  . Not on file   Social History  Narrative  . No narrative on file   The PMH, PSH, Social History, Family History, Medications, and allergies have been reviewed in [facility], and have been updated if relevant.   Review of Systems  Psychiatric/Behavioral: Negative.   All other systems reviewed and are negative.      Objective:    BP 128/80 (BP Location: Right Arm, Patient Position: Sitting, Cuff Size: Small)   Pulse 82   Temp 98.2 F (36.8 C) (Oral)   Resp 14   Ht 5\' 7"  (1.702 m)   Wt 168 lb 4 oz (76.3 kg)   SpO2 100%   BMI 26.35 kg/m    Physical Exam  General:  pleasant male in no acute distress Eyes:  PERRL Ears:  External ear exam shows no significant lesions or deformities.  TMs normal bilaterally Hearing is grossly normal bilaterally. Nose:  External nasal examination shows no deformity or inflammation. Nasal mucosa are pink and moist without lesions or exudates. Mouth:  Oral mucosa and oropharynx without lesions or exudates.  Teeth in good repair. Neck:  no carotid bruit or thyromegaly no cervical or supraclavicular lymphadenopathy  Lungs:  Normal respiratory effort, chest expands symmetrically. Lungs are clear to auscultation, no crackles or wheezes. Heart:  Normal rate and  regular rhythm. S1 and S2 normal without gallop, murmur, click, rub or other extra sounds. Abdomen:  Bowel sounds positive,abdomen soft and non-tender without masses, organomegaly or hernias noted. Pulses:  R and L posterior tibial pulses are full and equal bilaterally  Extremities:  no edema  Psych:  Good eye contact, not anxious or depressed appearing       Assessment & Plan:   Attention deficit hyperactivity disorder (ADHD), predominantly inattentive type No Follow-up on file.

## 2017-05-10 MED FILL — ADDERALL XR 30 MG CAP SA: 30 | 30 days supply | Qty: 30 | Fill #0

## 2017-06-15 ENCOUNTER — Encounter: Payer: Self-pay | Admitting: Family Medicine

## 2017-06-15 ENCOUNTER — Other Ambulatory Visit: Payer: Self-pay | Admitting: *Deleted

## 2017-06-15 MED ORDER — AMPHETAMINE-DEXTROAMPHET ER 30 MG PO CP24
30.0000 mg | ORAL_CAPSULE | Freq: Every day | ORAL | 0 refills | Status: DC
Start: 2017-06-15 — End: 2017-07-14

## 2017-07-12 ENCOUNTER — Encounter: Payer: Self-pay | Admitting: Family Medicine

## 2017-07-14 MED ORDER — AMPHETAMINE-DEXTROAMPHET ER 30 MG PO CP24: 30.0000 mg | ORAL_CAPSULE | Freq: Every day | ORAL | 0 refills | Status: DC

## 2017-07-14 NOTE — Telephone Encounter (Signed)
Last office visit 04/19/2017.  Last refilled 06/15/2017 for #30 with no refills.  Rx printed and placed in Dr. Hulen Shouts in box for signature.

## 2017-08-12 ENCOUNTER — Encounter: Payer: Self-pay | Admitting: Family Medicine

## 2017-08-13 NOTE — Telephone Encounter (Signed)
Requesting: Adderall Contract: Yes UDS: 6.07.2018 Low-risk next screen 12.07.2018 Last OV: 6.18.2018 Next OV: Not scheduled Last Refill: 9.12.2018   Please advise

## 2017-08-16 MED ORDER — AMPHETAMINE-DEXTROAMPHET ER 30 MG PO CP24: 30.0000 mg | ORAL_CAPSULE | Freq: Every day | ORAL | 0 refills | Status: DC

## 2017-08-18 ENCOUNTER — Other Ambulatory Visit: Payer: Self-pay | Admitting: Family Medicine

## 2017-09-14 ENCOUNTER — Encounter: Payer: Self-pay | Admitting: Family Medicine

## 2017-09-15 MED ORDER — AMPHETAMINE-DEXTROAMPHET ER 30 MG PO CP24: 30.0000 mg | ORAL_CAPSULE | Freq: Every day | ORAL | 0 refills | Status: DC

## 2017-09-16 ENCOUNTER — Other Ambulatory Visit: Payer: Self-pay

## 2017-09-16 MED ORDER — AMPHETAMINE-DEXTROAMPHET ER 30 MG PO CP24: 30.0000 mg | ORAL_CAPSULE | Freq: Every day | ORAL | 0 refills | Status: DC

## 2017-09-16 NOTE — Telephone Encounter (Signed)
Okay to reprint under my name and I will sign.

## 2017-09-16 NOTE — Addendum Note (Signed)
Addended by: Carter Kitten on: 09/16/2017 03:51 PM   Modules accepted: Orders

## 2017-09-16 NOTE — Telephone Encounter (Signed)
Ok to print have have you sign for Dr. Deborra Medina??

## 2017-09-20 ENCOUNTER — Telehealth: Payer: Self-pay | Admitting: Family Medicine

## 2017-09-20 NOTE — Telephone Encounter (Signed)
Error

## 2017-10-15 ENCOUNTER — Other Ambulatory Visit: Payer: Self-pay

## 2017-10-15 ENCOUNTER — Encounter: Payer: Self-pay | Admitting: Family Medicine

## 2017-10-15 MED ORDER — AMPHETAMINE-DEXTROAMPHET ER 30 MG PO CP24: 30.0000 mg | ORAL_CAPSULE | Freq: Every day | ORAL | 0 refills | Status: DC

## 2017-10-15 NOTE — Telephone Encounter (Signed)
Requesting: Adderall XR Contract: Yes UDS: 6.7.18 low-risk next screen 12.7.18 Last OV: 6.18.18 Next OV: Not scheduled but I sent him a MyChart message stating that he needs to be seen in January per guidelines for this class of medication Last Refill: 11.15.18   Please advise

## 2017-10-15 NOTE — Telephone Encounter (Signed)
Rx is ready at the front/sent pt a MyChart message/thx dmf

## 2017-10-28 ENCOUNTER — Other Ambulatory Visit: Payer: Self-pay | Admitting: Family Medicine

## 2017-10-28 NOTE — Telephone Encounter (Signed)
Pt was advised in October that would need appt for future fills/denied/thx dmf

## 2017-11-03 ENCOUNTER — Telehealth: Payer: Self-pay | Admitting: Family Medicine

## 2017-11-03 NOTE — Telephone Encounter (Signed)
Copied from [place] [phone]. Topic: Quick Communication - See Telephone Encounter >> Nov 03, 2017  4:48 PM Cleaster Corin, NT wrote: CRM for notification. See Telephone encounter for:   11/03/17. Pt. Calling to see if he can get a refill on his Adderall and Valtrex pt. Can be reached at [place], [place] [address] [place] [address] Phone: [phone] Fax: [phone]

## 2017-11-04 MED ORDER — VALACYCLOVIR HCL 1 G PO TABS: 1000.0000 mg | ORAL_TABLET | Freq: Every day | ORAL | 0 refills | Status: DC

## 2017-11-04 MED ORDER — AMPHETAMINE-DEXTROAMPHET ER 30 MG PO CP24: 30.0000 mg | ORAL_CAPSULE | Freq: Every day | ORAL | 0 refills | Status: DC

## 2017-11-04 NOTE — Telephone Encounter (Signed)
Yes.  Thank you.

## 2017-11-04 NOTE — Telephone Encounter (Signed)
Pt aware Rx is ready at the front/ he agrees to schedule an appt when he comes/thx dmf

## 2017-11-04 NOTE — Telephone Encounter (Signed)
TA-Pt last seen in June and is requesting a refill of Adderall and Valtrex/no visit scheduled/do you want to approve 1 month of each with the understanding that he schedule OV W/I that time frame? Plz advise/thx dmf

## 2017-11-25 ENCOUNTER — Encounter: Payer: Self-pay | Admitting: Family Medicine

## 2017-11-25 ENCOUNTER — Ambulatory Visit: Payer: 59 | Admitting: Family Medicine

## 2017-11-25 VITALS — BP 138/88 | HR 112 | Temp 98.9°F | Ht 67.0 in | Wt 176.0 lb

## 2017-11-25 DIAGNOSIS — F900 Attention-deficit hyperactivity disorder, predominantly inattentive type: Secondary | ICD-10-CM

## 2017-11-25 MED ORDER — AMPHETAMINE-DEXTROAMPHET ER 30 MG PO CP24: 30.0000 mg | ORAL_CAPSULE | Freq: Every day | ORAL | 0 refills | Status: DC

## 2017-11-25 MED ORDER — TIZANIDINE HCL 4 MG PO CAPS: 4.0000 mg | ORAL_CAPSULE | Freq: Three times a day (TID) | ORAL | 0 refills | Status: DC | PRN

## 2017-11-25 MED ORDER — CYCLOBENZAPRINE HCL 5 MG PO TABS: 5.0000 mg | ORAL_TABLET | Freq: Every day | ORAL | 0 refills | Status: DC | PRN

## 2017-11-25 MED ORDER — ESOMEPRAZOLE MAGNESIUM 40 MG PO CPDR: DELAYED_RELEASE_CAPSULE | ORAL | 3 refills | Status: DC

## 2017-11-25 NOTE — Progress Notes (Signed)
Subjective:   Patient ID: Bradley Schaefer, male    DOB: [birth year], 37 y.o.   MRN: [id number]  Bradley Schaefer is a pleasant 37 y.o. year old male who presents to clinic today with ADHD (Patient is here today to F/U with ADHD.  He currently takes Adderall XR 30mg  daily and is doing well on this and is needing refills.)  on 11/25/2017  HPI:  ADD- has been doing well on current dose of Adderall- 30 mg XR daily.  Concentrating well at home and at work.  Sleeping well.  UDS 04/09/17   Current Outpatient Medications on File Prior to Visit  Medication Sig Dispense Refill  . albuterol (PROVENTIL HFA;VENTOLIN HFA) 108 (90 BASE) MCG/ACT inhaler Inhale 2 puffs into the lungs every 6 (six) hours as needed for wheezing or shortness of breath. 1 Inhaler 0  . cetirizine (ZYRTEC) 10 MG tablet Take 10 mg by mouth daily as needed for allergies.    . valACYclovir (VALTREX) 1000 MG tablet Take 1 tablet (1,000 mg total) by mouth daily. 30 tablet 0   No current facility-administered medications on file prior to visit.     No Known Allergies  Past Medical History:  Diagnosis Date  . ADD (attention deficit disorder)   . Asthma   . GERD (gastroesophageal reflux disease)   . Herpes simplex   . Varicose veins     No past surgical history on file.  No family history on file.  Social History   Socioeconomic History  . Marital status: Married    Spouse name: Not on file  . Number of children: Not on file  . Years of education: Not on file  . Highest education level: Not on file  Social Needs  . Financial resource strain: Not on file  . Food insecurity - worry: Not on file  . Food insecurity - inability: Not on file  . Transportation needs - medical: Not on file  . Transportation needs - non-medical: Not on file  Occupational History  . Not on file  Tobacco Use  . Smoking status: Never Smoker  . Smokeless tobacco: Never Used  Substance and Sexual Activity  . Alcohol use: Yes    Comment:  occasionally  . Drug use: No  . Sexual activity: Not on file  Other Topics Concern  . Not on file  Social History Narrative  . Not on file   The PMH, PSH, Social History, Family History, Medications, and allergies have been reviewed in [facility], and have been updated if relevant.   Review of Systems  Psychiatric/Behavioral: Negative.   All other systems reviewed and are negative.      Objective:    BP 138/88 (BP Location: Left Arm, Patient Position: Sitting, Cuff Size: Normal)   Pulse (!) 112   Temp 98.9 F (37.2 C) (Oral)   Ht 5\' 7"  (1.702 m)   Wt 176 lb (79.8 kg)   SpO2 98%   BMI 27.57 kg/m    Physical Exam  General:  pleasant male in no acute distress Eyes:  PERRL Ears:  External ear exam shows no significant lesions or deformities.  TMs normal bilaterally Hearing is grossly normal bilaterally. Nose:  External nasal examination shows no deformity or inflammation. Nasal mucosa are pink and moist without lesions or exudates. Mouth:  Oral mucosa and oropharynx without lesions or exudates.  Teeth in good repair. Neck:  no carotid bruit or thyromegaly no cervical or supraclavicular lymphadenopathy  Lungs:  Normal respiratory effort, chest expands  symmetrically. Lungs are clear to auscultation, no crackles or wheezes. Heart:  Normal rate and regular rhythm. S1 and S2 normal without gallop, murmur, click, rub or other extra sounds. Abdomen:  Bowel sounds positive,abdomen soft and non-tender without masses, organomegaly or hernias noted. Pulses:  R and L posterior tibial pulses are full and equal bilaterally  Extremities:  no edema  Psych:  Good eye contact, not anxious or depressed appearing       Assessment & Plan:   Attention deficit hyperactivity disorder (ADHD), predominantly inattentive type No Follow-up on file.

## 2017-11-25 NOTE — Assessment & Plan Note (Signed)
>  15 minutes spent in face to face time with patient, >50% spent in counselling or coordination of care Well controlled. Rxs printed at current dose and given to pt.

## 2018-01-05 DIAGNOSIS — H5213 Myopia, bilateral: Secondary | ICD-10-CM | POA: Diagnosis not present

## 2018-01-05 DIAGNOSIS — H52221 Regular astigmatism, right eye: Secondary | ICD-10-CM | POA: Diagnosis not present

## 2018-01-07 ENCOUNTER — Other Ambulatory Visit: Payer: Self-pay | Admitting: Family Medicine

## 2018-01-18 MED FILL — ADDERALL XR 30 MG CAP SA: 30 | 30 days supply | Qty: 30 | Fill #0

## 2018-03-01 ENCOUNTER — Encounter: Payer: Self-pay | Admitting: Family Medicine

## 2018-03-01 ENCOUNTER — Other Ambulatory Visit: Payer: Self-pay

## 2018-03-01 MED ORDER — AMPHETAMINE-DEXTROAMPHET ER 30 MG PO CP24: 30.0000 mg | ORAL_CAPSULE | Freq: Every day | ORAL | 0 refills | Status: DC

## 2018-03-01 NOTE — Progress Notes (Signed)
Printed 3 months Adderall for TA to sign/sent message backto pt advising this will be ready for him to P/U at front and to plz sched appt for July/thx dmf

## 2018-03-29 ENCOUNTER — Encounter: Payer: Self-pay | Admitting: Family Medicine

## 2018-04-20 MED FILL — ADDERALL XR 30 MG CAP SA: 30 | 30 days supply | Qty: 30 | Fill #0

## 2018-05-02 ENCOUNTER — Other Ambulatory Visit: Payer: Self-pay

## 2018-05-02 ENCOUNTER — Ambulatory Visit: Payer: 59 | Admitting: Family Medicine

## 2018-05-02 ENCOUNTER — Encounter: Payer: Self-pay | Admitting: Family Medicine

## 2018-05-02 VITALS — BP 122/80 | HR 89 | Temp 98.7°F | Ht 67.0 in | Wt 171.2 lb

## 2018-05-02 DIAGNOSIS — F900 Attention-deficit hyperactivity disorder, predominantly inattentive type: Secondary | ICD-10-CM | POA: Diagnosis not present

## 2018-05-02 DIAGNOSIS — E162 Hypoglycemia, unspecified: Secondary | ICD-10-CM | POA: Diagnosis not present

## 2018-05-02 DIAGNOSIS — Z833 Family history of diabetes mellitus: Secondary | ICD-10-CM

## 2018-05-02 DIAGNOSIS — N529 Male erectile dysfunction, unspecified: Secondary | ICD-10-CM | POA: Diagnosis not present

## 2018-05-02 LAB — CBC WITH DIFFERENTIAL/PLATELET
Basophils Absolute: 0 K/uL (ref 0.0–0.1)
Basophils Relative: 0.2 % (ref 0.0–3.0)
Eosinophils Absolute: 0 K/uL (ref 0.0–0.7)
Eosinophils Relative: 0.6 % (ref 0.0–5.0)
HCT: 48.3 % (ref 39.0–52.0)
Hemoglobin: 16.8 g/dL (ref 13.0–17.0)
Lymphocytes Relative: 38.5 % (ref 12.0–46.0)
Lymphs Abs: 2.8 K/uL (ref 0.7–4.0)
MCHC: 34.8 g/dL (ref 30.0–36.0)
MCV: 86.8 fl (ref 78.0–100.0)
Monocytes Absolute: 0.7 K/uL (ref 0.1–1.0)
Monocytes Relative: 9.4 % (ref 3.0–12.0)
Neutro Abs: 3.7 K/uL (ref 1.4–7.7)
Neutrophils Relative %: 51.3 % (ref 43.0–77.0)
Platelets: 275 K/uL (ref 150.0–400.0)
RBC: 5.56 Mil/uL (ref 4.22–5.81)
RDW: 13.4 % (ref 11.5–15.5)
WBC: 7.3 K/uL (ref 4.0–10.5)

## 2018-05-02 LAB — LIPID PANEL
Cholesterol: 185 mg/dL (ref 0–200)
HDL: 60.8 mg/dL (ref >39.00)
LDL Cholesterol: 116 mg/dL — ABNORMAL HIGH (ref 0–99)
NonHDL: 123.88
Total CHOL/HDL Ratio: 3
Triglycerides: 40 mg/dL (ref 0.0–149.0)
VLDL: 8 mg/dL (ref 0.0–40.0)

## 2018-05-02 LAB — PSA: PSA: 0.38 ng/mL (ref 0.10–4.00)

## 2018-05-02 LAB — COMPREHENSIVE METABOLIC PANEL WITH GFR
ALT: 16 U/L (ref 0–53)
AST: 20 U/L (ref 0–37)
Albumin: 4.5 g/dL (ref 3.5–5.2)
Alkaline Phosphatase: 51 U/L (ref 39–117)
BUN: 12 mg/dL (ref 6–23)
CO2: 29 meq/L (ref 19–32)
Calcium: 9.4 mg/dL (ref 8.4–10.5)
Chloride: 101 meq/L (ref 96–112)
Creatinine, Ser: 0.98 mg/dL (ref 0.40–1.50)
GFR: 90.54 mL/min (ref >60.00)
Glucose, Bld: 104 mg/dL — ABNORMAL HIGH (ref 70–99)
Potassium: 4.3 meq/L (ref 3.5–5.1)
Sodium: 137 meq/L (ref 135–145)
Total Bilirubin: 0.8 mg/dL (ref 0.2–1.2)
Total Protein: 7.1 g/dL (ref 6.0–8.3)

## 2018-05-02 LAB — HEMOGLOBIN A1C: Hgb A1c MFr Bld: 5.3 % (ref 4.6–6.5)

## 2018-05-02 LAB — TESTOSTERONE: Testosterone: 419.21 ng/dL (ref 300.00–890.00)

## 2018-05-02 MED ORDER — AMPHETAMINE-DEXTROAMPHET ER 30 MG PO CP24: 30.0000 mg | ORAL_CAPSULE | Freq: Every day | ORAL | 0 refills | Status: DC

## 2018-05-02 MED ORDER — VALACYCLOVIR HCL 1 G PO TABS: 1000.0000 mg | ORAL_TABLET | Freq: Every day | ORAL | 1 refills | Status: DC

## 2018-05-02 NOTE — Assessment & Plan Note (Signed)
New- given age and history, may need endo referral but will start out checking testosterone today. The patient indicates understanding of these issues and agrees with the plan.

## 2018-05-02 NOTE — Patient Instructions (Signed)
Great to see you. I will call you with your lab results from today and you can view them online.   

## 2018-05-02 NOTE — Assessment & Plan Note (Signed)
Well controlled on current dose. UDS and contract updated today. Rxs refilled today.

## 2018-05-02 NOTE — Progress Notes (Signed)
Subjective:   Patient ID: Bradley Schaefer, male    DOB: [birth year], 37 y.o.   MRN: [id number]  Bradley Schaefer is a pleasant 37 y.o. year old male who presents to clinic today with Follow-up (Patient is here today to F/U with ADHD.  He currently takes Adderall XR 30mg  1qd.  He is compliant. PMP shows compliance.  He is due for a UDS and CSC.  He wishes to continue current dosing as it works well for him.)  on 05/02/2018  HPI:  ADD- has been doing well on current dose of Adderall- 30 mg XR daily.  Concentrating well at home and at work.  Sleeping well.  UDS 04/09/17  ED- has noticed difficulty getting and maintaining an erection for several months.  When he was a teenager, he actually received testosterone shots for being late in starting puberty.  He thinks this lasted for 6 months to a year.  Has had no issues since until the past few months.  Having difficulty with a partner and while masturbating.   Current Outpatient Medications on File Prior to Visit  Medication Sig Dispense Refill  . albuterol (PROVENTIL HFA;VENTOLIN HFA) 108 (90 BASE) MCG/ACT inhaler Inhale 2 puffs into the lungs every 6 (six) hours as needed for wheezing or shortness of breath. 1 Inhaler 0  . cetirizine (ZYRTEC) 10 MG tablet Take 10 mg by mouth daily as needed for allergies.    . cyclobenzaprine (FLEXERIL) 5 MG tablet Take 1 tablet (5 mg total) by mouth daily as needed for muscle spasms. 30 tablet 0  . esomeprazole (NEXIUM) 40 MG capsule TAKE 1 CAPSULE BY MOUTH DAILY AT 12 NOON 90 capsule 3  . tiZANidine (ZANAFLEX) 4 MG capsule Take 1 capsule (4 mg total) by mouth 3 (three) times daily as needed for muscle spasms. 30 capsule 0   No current facility-administered medications on file prior to visit.     No Known Allergies  Past Medical History:  Diagnosis Date  . ADD (attention deficit disorder)   . Asthma   . GERD (gastroesophageal reflux disease)   . Herpes simplex   . Varicose veins     No past surgical  history on file.  No family history on file.  Social History   Socioeconomic History  . Marital status: Married    Spouse name: Not on file  . Number of children: Not on file  . Years of education: Not on file  . Highest education level: Not on file  Occupational History  . Not on file  Social Needs  . Financial resource strain: Not on file  . Food insecurity:    Worry: Not on file    Inability: Not on file  . Transportation needs:    Medical: Not on file    Non-medical: Not on file  Tobacco Use  . Smoking status: Never Smoker  . Smokeless tobacco: Never Used  Substance and Sexual Activity  . Alcohol use: Yes    Comment: occasionally  . Drug use: No  . Sexual activity: Not on file  Lifestyle  . Physical activity:    Days per week: Not on file    Minutes per session: Not on file  . Stress: Not on file  Relationships  . Social connections:    Talks on phone: Not on file    Gets together: Not on file    Attends religious service: Not on file    Active member of club or organization: Not on file  Attends meetings of clubs or organizations: Not on file    Relationship status: Not on file  . Intimate partner violence:    Fear of current or ex partner: Not on file    Emotionally abused: Not on file    Physically abused: Not on file    Forced sexual activity: Not on file  Other Topics Concern  . Not on file  Social History Narrative  . Not on file   The PMH, PSH, Social History, Family History, Medications, and allergies have been reviewed in [facility], and have been updated if relevant.   Review of Systems  Constitutional: Negative.   Genitourinary: Negative for decreased urine volume, difficulty urinating, discharge, dysuria, enuresis, flank pain, frequency, genital sores, hematuria, penile pain, penile swelling, scrotal swelling, testicular pain and urgency.       +ED  Psychiatric/Behavioral: Negative.   All other systems reviewed and are negative.        Objective:    BP 122/80 (BP Location: Left Arm, Patient Position: Sitting, Cuff Size: Normal)   Pulse 89   Temp 98.7 F (37.1 C) (Oral)   Ht 5\' 7"  (1.702 m)   Wt 171 lb 3.2 oz (77.7 kg)   SpO2 96%   BMI 26.81 kg/m    Physical Exam  General:  pleasant male in no acute distress Eyes:  PERRL Ears:  External ear exam shows no significant lesions or deformities.  TMs normal bilaterally Hearing is grossly normal bilaterally. Nose:  External nasal examination shows no deformity or inflammation. Nasal mucosa are pink and moist without lesions or exudates. Mouth:  Oral mucosa and oropharynx without lesions or exudates.  Teeth in good repair. Neck:  no carotid bruit or thyromegaly no cervical or supraclavicular lymphadenopathy  Lungs:  Normal respiratory effort, chest expands symmetrically. Lungs are clear to auscultation, no crackles or wheezes. Heart:  Normal rate and regular rhythm. S1 and S2 normal without gallop, murmur, click, rub or other extra sounds. Abdomen:  Bowel sounds positive,abdomen soft and non-tender without masses, organomegaly or hernias noted. Pulses:  R and L posterior tibial pulses are full and equal bilaterally  Extremities:  no edema  Psych:  Good eye contact, not anxious or depressed appearing       Assessment & Plan:   Attention deficit hyperactivity disorder (ADHD), predominantly inattentive type - Plan: Pain Mgmt, Profile 8 w/Conf, U, CBC with Differential/Platelet  Family history of diabetes mellitus - Plan: Lipid panel, Hemoglobin A1c  Hypoglycemia - Plan: Comprehensive metabolic panel, Hemoglobin A1c  Erectile dysfunction, unspecified erectile dysfunction type - Plan: Testosterone, Testosterone, free, PSA No follow-ups on file.

## 2018-05-03 ENCOUNTER — Encounter: Payer: Self-pay | Admitting: Family Medicine

## 2018-05-04 ENCOUNTER — Other Ambulatory Visit: Payer: Self-pay | Admitting: Family Medicine

## 2018-05-04 ENCOUNTER — Encounter: Payer: Self-pay | Admitting: Family Medicine

## 2018-05-04 DIAGNOSIS — R7989 Other specified abnormal findings of blood chemistry: Secondary | ICD-10-CM

## 2018-05-05 LAB — PAIN MGMT, PROFILE 8 W/CONF, U
6 Acetylmorphine: NEGATIVE ng/mL (ref <10)
Alcohol Metabolites: POSITIVE ng/mL — AB (ref <500)
Amphetamine: 11388 ng/mL — ABNORMAL HIGH (ref <250)
Amphetamines: POSITIVE ng/mL — AB (ref <500)
Benzodiazepines: NEGATIVE ng/mL (ref <100)
Buprenorphine, Urine: NEGATIVE ng/mL (ref <5)
Cocaine Metabolite: NEGATIVE ng/mL (ref <150)
Creatinine: 124.5 mg/dL
Ethyl Glucuronide (ETG): 10017 ng/mL — ABNORMAL HIGH (ref <500)
Ethyl Sulfate (ETS): 2203 ng/mL — ABNORMAL HIGH (ref <100)
MDMA: NEGATIVE ng/mL (ref <500)
Marijuana Metabolite: NEGATIVE ng/mL (ref <20)
Methamphetamine: NEGATIVE ng/mL (ref <250)
Opiates: NEGATIVE ng/mL (ref <100)
Oxidant: NEGATIVE ug/mL (ref <200)
Oxycodone: NEGATIVE ng/mL (ref <100)
pH: 6.32 (ref 4.5–9.0)

## 2018-05-05 LAB — TESTOSTERONE, FREE: TESTOSTERONE FREE: 74.6 pg/mL (ref 46.0–224.0)

## 2018-05-06 DIAGNOSIS — N529 Male erectile dysfunction, unspecified: Secondary | ICD-10-CM

## 2018-06-30 DIAGNOSIS — F5221 Male erectile disorder: Secondary | ICD-10-CM | POA: Diagnosis not present

## 2018-08-31 ENCOUNTER — Encounter: Payer: Self-pay | Admitting: Family Medicine

## 2018-09-01 MED ORDER — AMPHETAMINE-DEXTROAMPHET ER 30 MG PO CP24: 30.0000 mg | ORAL_CAPSULE | Freq: Every day | ORAL | 0 refills | Status: DC

## 2018-09-15 MED FILL — ADDERALL XR 30 MG CAP SA: 30 | 30 days supply | Qty: 30 | Fill #0

## 2018-09-15 MED FILL — valACYclovir HCL 1 GM TABS: 1 | 90 days supply | Qty: 90 | Fill #0

## 2018-10-11 ENCOUNTER — Encounter: Payer: Self-pay | Admitting: Family Medicine

## 2018-10-11 ENCOUNTER — Other Ambulatory Visit: Payer: Self-pay | Admitting: Family Medicine

## 2018-10-11 MED ORDER — AMPHETAMINE-DEXTROAMPHET ER 30 MG PO CP24: 30.0000 mg | ORAL_CAPSULE | Freq: Every day | ORAL | 0 refills | Status: DC

## 2018-10-11 NOTE — Progress Notes (Signed)
Rx sent to pharmacy on file.

## 2018-10-20 MED FILL — ADDERALL XR 30 MG CAP SA: 30 | 30 days supply | Qty: 30 | Fill #0

## 2018-11-14 ENCOUNTER — Encounter: Payer: Self-pay | Admitting: Family Medicine

## 2018-11-17 ENCOUNTER — Other Ambulatory Visit: Payer: Self-pay | Admitting: Family Medicine

## 2018-11-17 NOTE — Telephone Encounter (Signed)
Requested medication (s) are due for refill today: yes to all 3   Requested medication (s) are on the active medication list: yes  Last refill:  Adderall:10/11/18                  Nexium: 11/25/17                  Tizanidine:11/25/17    Future visit scheduled: pt is due for an appointment stating that he has had this medication for years and Dr Deborra Medina does not make him come in every three months.   Notes to clinic:     Requested Prescriptions  Pending Prescriptions Disp Refills   amphetamine-dextroamphetamine (ADDERALL XR) 30 MG 24 hr capsule 30 capsule 0    Sig: Take 1 capsule (30 mg total) by mouth daily.     Not Delegated - Psychiatry:  Stimulants/ADHD Failed - 11/17/2018  2:01 PM      Failed - This refill cannot be delegated      Failed - Urine Drug Screen completed in last 360 days.      Failed - Valid encounter within last 3 months    Recent Outpatient Visits          6 months ago Attention deficit hyperactivity disorder (ADHD), predominantly inattentive type   [facility] Deborra Medina, [place] M, MD   11 months ago Attention deficit hyperactivity disorder (ADHD), predominantly inattentive type   [facility] Loran Senters, [place] M, MD            esomeprazole (NEXIUM) 40 MG capsule 90 capsule 3    Sig: TAKE 1 CAPSULE BY MOUTH DAILY AT 12 NOON     Gastroenterology: Proton Pump Inhibitors Passed - 11/17/2018  2:01 PM      Passed - Valid encounter within last 12 months    Recent Outpatient Visits          6 months ago Attention deficit hyperactivity disorder (ADHD), predominantly inattentive type   [facility] Loran Senters, Marciano Sequin, MD   11 months ago Attention deficit hyperactivity disorder (ADHD), predominantly inattentive type   [facility] Loran Senters, Marciano Sequin, MD            tiZANidine (ZANAFLEX) 4 MG capsule 30 capsule 0    Sig: Take 1 capsule (4 mg total) by mouth 3 (three) times daily as needed for muscle  spasms.     Not Delegated - Cardiovascular:  Alpha-2 Agonists - tizanidine Failed - 11/17/2018  2:01 PM      Failed - This refill cannot be delegated      Failed - Valid encounter within last 6 months    Recent Outpatient Visits          6 months ago Attention deficit hyperactivity disorder (ADHD), predominantly inattentive type   [facility] Deborra Medina, [place] M, MD   11 months ago Attention deficit hyperactivity disorder (ADHD), predominantly inattentive type   [facility] [address] Marciano Sequin, MD

## 2018-11-17 NOTE — Telephone Encounter (Signed)
Copied from [place] [id number]. Topic: Quick Communication - Rx Refill/Question >> Nov 17, 2018  1:55 PM Bradley Schaefer wrote: Medication: amphetamine-dextroamphetamine (ADDERALL XR) 30 MG 24 hr capsule esomeprazole (NEXIUM) 40 MG capsule   tiZANidine (ZANAFLEX) 4 MG capsule     Has the patient contacted their pharmacy? Yes.  Today pt called to see if ready pt sent my chart message on the 13th pt will be out of Adderall tomorrow (Agent: If no, request that the patient contact the pharmacy for the refill.) (Agent: If yes, when and what did the pharmacy advise?) call provider haven't heard anything back  Preferred Pharmacy (with phone number or street name):     [place], [place] - [address]. [phone] (Phone) [phone] (Fax)    Agent: Please be advised that RX refills may take up to 3 business days. We ask that you follow-up with your pharmacy.

## 2018-11-18 ENCOUNTER — Other Ambulatory Visit: Payer: Self-pay | Admitting: Cardiovascular Disease

## 2018-11-18 MED ORDER — AMPHETAMINE-DEXTROAMPHET ER 30 MG PO CP24: 30.0000 mg | ORAL_CAPSULE | Freq: Every day | ORAL | 0 refills | Status: DC

## 2018-11-18 MED FILL — ADDERALL XR 30 MG CAP SA: 30 | 30 days supply | Qty: 30 | Fill #0

## 2018-11-21 ENCOUNTER — Ambulatory Visit: Payer: 59 | Admitting: Family Medicine

## 2018-11-21 ENCOUNTER — Other Ambulatory Visit: Payer: Self-pay

## 2018-11-21 MED ORDER — TIZANIDINE HCL 4 MG PO CAPS: 4.0000 mg | ORAL_CAPSULE | Freq: Three times a day (TID) | ORAL | 0 refills | Status: DC | PRN

## 2018-11-21 MED ORDER — AMPHETAMINE-DEXTROAMPHET ER 30 MG PO CP24: 30.0000 mg | ORAL_CAPSULE | Freq: Every day | ORAL | 0 refills | Status: DC

## 2018-11-21 MED ORDER — TIZANIDINE HCL 4 MG PO CAPS: 4.0000 mg | ORAL_CAPSULE | Freq: Three times a day (TID) | ORAL | 5 refills | Status: DC | PRN

## 2018-11-21 MED ORDER — ESOMEPRAZOLE MAGNESIUM 40 MG PO CPDR: DELAYED_RELEASE_CAPSULE | ORAL | 3 refills | Status: DC

## 2018-11-21 MED ORDER — ESOMEPRAZOLE MAGNESIUM 40 MG PO CPDR: DELAYED_RELEASE_CAPSULE | ORAL | 5 refills | Status: DC

## 2018-11-21 MED FILL — tiZANidine HCL 4 MG TABS: 4 | 90 days supply | Qty: 270 | Fill #0

## 2018-11-21 MED FILL — ESOMEPRAZOLE MAG DR 40 MG C: 40 | 90 days supply | Qty: 90 | Fill #0

## 2018-12-19 MED FILL — ADDERALL XR 30 MG CAP SA: 30 | 30 days supply | Qty: 30 | Fill #0

## 2018-12-21 ENCOUNTER — Encounter (HOSPITAL_COMMUNITY): Payer: Self-pay

## 2018-12-21 ENCOUNTER — Ambulatory Visit (HOSPITAL_COMMUNITY)
Admission: EM | Admit: 2018-12-21 | Discharge: 2018-12-21 | Disposition: A | Discharge status: Home / Self Care | Payer: 59 | Attending: Family Medicine | Admitting: Family Medicine

## 2018-12-21 ENCOUNTER — Other Ambulatory Visit: Payer: Self-pay

## 2018-12-21 ENCOUNTER — Ambulatory Visit (INDEPENDENT_AMBULATORY_CARE_PROVIDER_SITE_OTHER): Payer: 59

## 2018-12-21 ENCOUNTER — Encounter: Payer: Self-pay | Admitting: Family Medicine

## 2018-12-21 DIAGNOSIS — W19XXXA Unspecified fall, initial encounter: Secondary | ICD-10-CM

## 2018-12-21 DIAGNOSIS — Y92015 Private garage of single-family (private) house as the place of occurrence of the external cause: Secondary | ICD-10-CM

## 2018-12-21 DIAGNOSIS — S83005A Unspecified dislocation of left patella, initial encounter: Secondary | ICD-10-CM

## 2018-12-21 DIAGNOSIS — S8992XA Unspecified injury of left lower leg, initial encounter: Secondary | ICD-10-CM

## 2018-12-21 DIAGNOSIS — M25562 Pain in left knee: Secondary | ICD-10-CM | POA: Diagnosis not present

## 2018-12-21 DIAGNOSIS — S83102A Unspecified subluxation of left knee, initial encounter: Secondary | ICD-10-CM | POA: Diagnosis not present

## 2018-12-21 MED ORDER — TRAMADOL HCL 50 MG PO TABS: 50.0000 mg | ORAL_TABLET | Freq: Four times a day (QID) | ORAL | 0 refills | Status: DC | PRN

## 2018-12-21 NOTE — Discharge Instructions (Addendum)
Follow-up with primary care or sports medicine

## 2018-12-21 NOTE — ED Triage Notes (Signed)
Pt cc left knee pain, pt felt his knee pop out of place. This happened about hour ago.

## 2018-12-21 NOTE — ED Provider Notes (Signed)
[facility]    CSN: [id number] Arrival date & time: 12/21/18  1956     History   Chief Complaint Chief Complaint  Patient presents with  . Knee Pain    HPI Bradley Schaefer is a 38 y.o. male.   Patient fell in his garage and felt like his patella dislocated laterally.  When he straighten the leg out the kneecap went back into place.  HPI  Past Medical History:  Diagnosis Date  . ADD (attention deficit disorder)   . Asthma   . GERD (gastroesophageal reflux disease)   . Herpes simplex   . Varicose veins     Patient Active Problem List   Diagnosis Date Noted  . ED (erectile dysfunction) 05/02/2018  . GENITAL HERPES 04/05/2009  . Attention deficit hyperactivity disorder (ADHD) 04/05/2009  . INSOMNIA 03/22/2008  . ASTHMA 02/25/2008  . ALLERGY 02/25/2008    History reviewed. No pertinent surgical history.     Home Medications    Prior to Admission medications   Medication Sig Start Date End Date Taking? Authorizing Provider  albuterol (PROVENTIL HFA;VENTOLIN HFA) 108 (90 BASE) MCG/ACT inhaler Inhale 2 puffs into the lungs every 6 (six) hours as needed for wheezing or shortness of breath. 11/16/13   Lucille Passy, MD  amphetamine-dextroamphetamine (ADDERALL XR) 30 MG 24 hr capsule Take 1 capsule (30 mg total) by mouth daily. 11/21/18   Lucille Passy, MD  amphetamine-dextroamphetamine (ADDERALL XR) 30 MG 24 hr capsule Take 1 capsule (30 mg total) by mouth daily. 11/18/18 12/19/18  Josue Hector, MD  cetirizine (ZYRTEC) 10 MG tablet Take 10 mg by mouth daily as needed for allergies.    [provider]  cyclobenzaprine (FLEXERIL) 5 MG tablet Take 1 tablet (5 mg total) by mouth daily as needed for muscle spasms. 11/25/17   Lucille Passy, MD  esomeprazole (NEXIUM) 40 MG capsule TAKE 1 CAPSULE BY MOUTH DAILY AT 12 NOON 11/21/18   Lucille Passy, MD  tiZANidine (ZANAFLEX) 4 MG capsule Take 1 capsule (4 mg total) by mouth 3 (three) times daily as needed for muscle  spasms. 11/21/18   Lucille Passy, MD  valACYclovir (VALTREX) 1000 MG tablet Take 1 tablet (1,000 mg total) by mouth daily. 05/02/18   Lucille Passy, MD    Family History History reviewed. No pertinent family history.  Social History Social History   Tobacco Use  . Smoking status: Never Smoker  . Smokeless tobacco: Never Used  Substance Use Topics  . Alcohol use: Yes    Comment: occasionally  . Drug use: No     Allergies   Patient has no known allergies.   Review of Systems Review of Systems  Musculoskeletal: Positive for arthralgias.  All other systems reviewed and are negative.    Physical Exam Triage Vital Signs ED Triage Vitals  Enc Vitals Group     BP 12/21/18 2010 (!) 137/94     Pulse Rate 12/21/18 2010 86     Resp 12/21/18 2010 18     Temp 12/21/18 2010 98.8 F (37.1 C)     Temp Source 12/21/18 2010 Oral     SpO2 12/21/18 2010 98 %     Weight --      Height --      Head Circumference --      Peak Flow --      Pain Score 12/21/18 2013 10     Pain Loc --      Pain  Edu? --      Excl. in [place]? --    No data found.  Updated Vital Signs BP (!) 137/94 (BP Location: Right Arm)   Pulse 86   Temp 98.8 F (37.1 C) (Oral)   Resp 18   SpO2 98%   Visual Acuity Right Eye Distance:   Left Eye Distance:   Bilateral Distance:    Right Eye Near:   Left Eye Near:    Bilateral Near:     Physical Exam Constitutional:      Appearance: Normal appearance. He is normal weight.  Musculoskeletal:     Comments: Left knee knee is very tender.  There is some effusion.  He will not allow stress testing due to pain. X-ray shows no bony changes but some small effusion suggesting possible internal derangement.  Neurological:     Mental Status: He is alert.      UC Treatments / Results  Labs (all labs ordered are listed, but only abnormal results are displayed) Labs Reviewed - No data to display  EKG None  Radiology Dg Knee Complete 4 Views Left  Result Date:  12/21/2018 CLINICAL DATA:  Left knee injury.  Subluxation. EXAM: LEFT KNEE - COMPLETE 4+ VIEW COMPARISON:  None. FINDINGS: The left knee is located. A small effusion is present. No acute or focal osseous abnormality is present. IMPRESSION: 1. Small joint effusion. No acute or focal osseous abnormality is present. This may represent internal derangement. Electronically Signed   By: San Morelle M.D.   On: 12/21/2018 20:28    Procedures Procedures (including critical care time)  Medications Ordered in UC Medications - No data to display  Initial Impression / Assessment and Plan / UC Course  I have reviewed the triage vital signs and the nursing notes.  Pertinent labs & imaging results that were available during my care of the patient were reviewed by me and considered in my medical decision making (see chart for details).     Knee pain.  Possible lateral patellar dislocation that reduced itself.  Will put in immobilizer and follow-up with his PCP or sports medicine physician Final Clinical Impressions(s) / UC Diagnoses   Final diagnoses:  None   Discharge Instructions   None    ED Prescriptions    None     Controlled Substance Prescriptions [place] Controlled Substance Registry consulted? No   Wardell Honour, MD 12/21/18 2042

## 2018-12-22 ENCOUNTER — Ambulatory Visit (INDEPENDENT_AMBULATORY_CARE_PROVIDER_SITE_OTHER): Payer: 59

## 2018-12-22 ENCOUNTER — Ambulatory Visit: Payer: 59 | Admitting: Family Medicine

## 2018-12-22 ENCOUNTER — Encounter: Payer: Self-pay | Admitting: Family Medicine

## 2018-12-22 VITALS — BP 120/100 | HR 97 | Temp 98.5°F | Ht 67.0 in

## 2018-12-22 DIAGNOSIS — M25562 Pain in left knee: Secondary | ICD-10-CM

## 2018-12-22 DIAGNOSIS — S83005A Unspecified dislocation of left patella, initial encounter: Secondary | ICD-10-CM | POA: Diagnosis not present

## 2018-12-22 HISTORY — DX: Unspecified dislocation of left patella, initial encounter: S83.005A

## 2018-12-22 NOTE — Patient Instructions (Signed)
Nice to meet you  Please try to ice the knee  Please continue the ibuprofen for pain  Please follow up in one week.

## 2018-12-22 NOTE — Assessment & Plan Note (Signed)
Reports dislocation with self reduction last night.  Having significant pain and does have effusion on exam.  No prior history of similar symptoms and no prior surgery on the knee. -Provided Duexis sample. -Provided crutches and bracing. -Provided work note with limitations. -Follow-up in 1 week.  May need to aspirate at that time.

## 2018-12-22 NOTE — Progress Notes (Signed)
Bradley Schaefer - 38 y.o. male MRN [id number]  Date of birth: 1981-03-29  SUBJECTIVE:  Including CC & ROS.  Chief Complaint  Patient presents with  . Knee Pain    Bradley Schaefer is a 38 y.o. male that is presenting with acute left knee pain and swelling.  He was working in his garage last night and felt his patella move laterally.  He had it self reduces when he straightened his leg.  Since that time he had significant pain.  He was evaluated in the urgent care last night.  He has taken tramadol and Advil for the pain with limited improvement.  He does have pain localized to the knee.  No history of similar symptoms.  No prior surgeries on the knee.  Pain is severe and localized to the knee.  Worse with any movement or ambulation.  Independent review of the left knee x-ray from 2/19 shows a small effusion.   Review of Systems  Constitutional: Negative for fever.  HENT: Negative for congestion.   Respiratory: Negative for cough.   Cardiovascular: Negative for chest pain.  Gastrointestinal: Negative for abdominal pain.  Musculoskeletal: Positive for gait problem and joint swelling.  Skin: Negative for color change.  Neurological: Negative for weakness.  Hematological: Negative for adenopathy.  Psychiatric/Behavioral: Negative for agitation.    HISTORY: Past Medical, Surgical, Social, and Family History Reviewed & Updated per EMR.   Pertinent Historical Findings include:  Past Medical History:  Diagnosis Date  . ADD (attention deficit disorder)   . Asthma   . GERD (gastroesophageal reflux disease)   . Herpes simplex   . Varicose veins     No past surgical history on file.  No Known Allergies  No family history on file.   Social History   Socioeconomic History  . Marital status: Married    Spouse name: Not on file  . Number of children: Not on file  . Years of education: Not on file  . Highest education level: Not on file  Occupational History  . Not on file  Social Needs   . Financial resource strain: Not on file  . Food insecurity:    Worry: Not on file    Inability: Not on file  . Transportation needs:    Medical: Not on file    Non-medical: Not on file  Tobacco Use  . Smoking status: Never Smoker  . Smokeless tobacco: Never Used  Substance and Sexual Activity  . Alcohol use: Yes    Comment: occasionally  . Drug use: No  . Sexual activity: Not on file  Lifestyle  . Physical activity:    Days per week: Not on file    Minutes per session: Not on file  . Stress: Not on file  Relationships  . Social connections:    Talks on phone: Not on file    Gets together: Not on file    Attends religious service: Not on file    Active member of club or organization: Not on file    Attends meetings of clubs or organizations: Not on file    Relationship status: Not on file  . Intimate partner violence:    Fear of current or ex partner: Not on file    Emotionally abused: Not on file    Physically abused: Not on file    Forced sexual activity: Not on file  Other Topics Concern  . Not on file  Social History Narrative  . Not on file  PHYSICAL EXAM:  VS: BP (!) 120/100   Pulse 97   Temp 98.5 F (36.9 C) (Oral)   Ht 5\' 7"  (1.702 m)   SpO2 97%   BMI 26.81 kg/m  Physical Exam Gen: NAD, alert, cooperative with exam, well-appearing ENT: normal lips, normal nasal mucosa,  Eye: normal EOM, normal conjunctiva and lids CV:  no edema, +2 pedal pulses   Resp: no accessory muscle use, non-labored,  Skin: no rashes, no areas of induration  Neuro: normal tone, normal sensation to touch Psych:  normal insight, alert and oriented MSK:  Left knee: Tenderness to palpation over the medial patella. Small effusion. Full extension achieved. Limited flexion to around 90 degrees. No instability with valgus or varus stress testing. No reproduction of patellar dislocation. Neurovascular intact  Limited ultrasound: Left knee:  Moderate effusion in the  suprapatellar pouch. Normal-appearing quadricep and patellar tendon. Normal joint space of the medial lateral joint line. Anterior horn of the medial meniscus with possible tear.  Summary: Moderate to severe effusion in the suprapatellar pouch  Ultrasound and interpretation by Clearance Coots, MD      ASSESSMENT & PLAN:   Dislocation of left patella Reports dislocation with self reduction last night.  Having significant pain and does have effusion on exam.  No prior history of similar symptoms and no prior surgery on the knee. -Provided Duexis sample. -Provided crutches and bracing. -Provided work note with limitations. -Follow-up in 1 week.  May need to aspirate at that time.

## 2018-12-28 ENCOUNTER — Ambulatory Visit: Payer: 59 | Admitting: Family Medicine

## 2018-12-28 ENCOUNTER — Ambulatory Visit (INDEPENDENT_AMBULATORY_CARE_PROVIDER_SITE_OTHER): Payer: 59

## 2018-12-28 ENCOUNTER — Encounter: Payer: Self-pay | Admitting: Family Medicine

## 2018-12-28 VITALS — BP 140/100 | HR 91 | Temp 98.2°F | Ht 67.0 in | Wt 177.2 lb

## 2018-12-28 DIAGNOSIS — G8929 Other chronic pain: Secondary | ICD-10-CM

## 2018-12-28 DIAGNOSIS — M25561 Pain in right knee: Secondary | ICD-10-CM

## 2018-12-28 DIAGNOSIS — S83005D Unspecified dislocation of left patella, subsequent encounter: Secondary | ICD-10-CM | POA: Diagnosis not present

## 2018-12-28 DIAGNOSIS — M25562 Pain in left knee: Secondary | ICD-10-CM | POA: Diagnosis not present

## 2018-12-28 NOTE — Patient Instructions (Signed)
Good to see you  Please use advil or tylenol for pain  Please continue the brace while you are walking  I will call you with the MRI results  Please use ice on the knee  Please try using a stationary bike or swimming for exercise

## 2018-12-28 NOTE — Assessment & Plan Note (Signed)
Acute on chronic. Significant trauma in 2012 and now having pain and mechanical symptoms  - xray  - MRI to evaluate for meniscal tear or chondromalacia  - counseled on HEP and supportive care

## 2018-12-28 NOTE — Progress Notes (Signed)
Bradley Schaefer - 38 y.o. male MRN [id number]  Date of birth: 26-Jan-1981  SUBJECTIVE:  Including CC & ROS.  Chief Complaint  Patient presents with  . Follow-up    disclocation of patella/ ton of swelling, pain not terrible but uncomfortable, without brace not stable (FMLA paperwork)    Bradley Schaefer is a 38 y.o. male that is following up for his left knee pain.  Symptoms and history suggestive of a patellar dislocation with self reduction.  Has been wearing the brace and that helps with ambulation.  He feels unstable in the left knee when he does not have the brace on.  Does have ongoing effusion.  Pain occurs with flexion..  Acute on chronic right knee pain that has been worsening since his injury.  Pain is anterior nature.  He has a distant history in 2012 where he had a screw going to his knee.  At that time he had an injection has had improvement of the pain.  Since his injury his pain seems to be getting worse.  He does feel like it is going to lock or give way.  Has swelling intermittently.  Has had sharp throbbing pain is localized to the knee.   Review of Systems  Constitutional: Negative for fever.  HENT: Negative for congestion.   Respiratory: Negative for cough.   Cardiovascular: Negative for chest pain.  Gastrointestinal: Negative for abdominal pain.  Musculoskeletal: Positive for gait problem and joint swelling.  Skin: Negative for color change.  Neurological: Negative for weakness.  Hematological: Negative for adenopathy.  Psychiatric/Behavioral: Negative for agitation.    HISTORY: Past Medical, Surgical, Social, and Family History Reviewed & Updated per EMR.   Pertinent Historical Findings include:  Past Medical History:  Diagnosis Date  . ADD (attention deficit disorder)   . Asthma   . GERD (gastroesophageal reflux disease)   . Herpes simplex   . Varicose veins     No past surgical history on file.  No Known Allergies  No family history on file.   Social  History   Socioeconomic History  . Marital status: Married    Spouse name: Not on file  . Number of children: Not on file  . Years of education: Not on file  . Highest education level: Not on file  Occupational History  . Not on file  Social Needs  . Financial resource strain: Not on file  . Food insecurity:    Worry: Not on file    Inability: Not on file  . Transportation needs:    Medical: Not on file    Non-medical: Not on file  Tobacco Use  . Smoking status: Never Smoker  . Smokeless tobacco: Never Used  Substance and Sexual Activity  . Alcohol use: Yes    Comment: occasionally  . Drug use: No  . Sexual activity: Not on file  Lifestyle  . Physical activity:    Days per week: Not on file    Minutes per session: Not on file  . Stress: Not on file  Relationships  . Social connections:    Talks on phone: Not on file    Gets together: Not on file    Attends religious service: Not on file    Active member of club or organization: Not on file    Attends meetings of clubs or organizations: Not on file    Relationship status: Not on file  . Intimate partner violence:    Fear of current or ex partner: Not on  file    Emotionally abused: Not on file    Physically abused: Not on file    Forced sexual activity: Not on file  Other Topics Concern  . Not on file  Social History Narrative  . Not on file     PHYSICAL EXAM:  VS: BP (!) 140/100   Pulse 91   Temp 98.2 F (36.8 C) (Oral)   Ht 5\' 7"  (1.702 m)   Wt 177 lb 3.2 oz (80.4 kg)   SpO2 96%   BMI 27.75 kg/m  Physical Exam Gen: NAD, alert, cooperative with exam, well-appearing ENT: normal lips, normal nasal mucosa,  Eye: normal EOM, normal conjunctiva and lids CV:  no edema, +2 pedal pulses   Resp: no accessory muscle use, non-labored,  Skin: no rashes, no areas of induration  Neuro: normal tone, normal sensation to touch Psych:  normal insight, alert and oriented MSK:  Left knee: Mild effusion. No  translation laterally of the patella. Normal flexion extension. Some pain with palpation of the patella Mild instability with valgus or varus stress testing. Some pain with McMurray's testing. Right knee: No obvious effusion. Some instability with valgus and varus stress testing. Positive Murray's test. Normal strength resistance. Normal range of motion. Some pain with patellar grind. Neurovascular intact  Limited ultrasound: Left knee:  Moderate effusion within the suprapatellar pouch. Normal-appearing medial joint line.  Does have hypoechoic change to suggest a chronic degenerative tear. Normal-appearing quadricep and patellar tendon  Summary: Moderate effusion  Ultrasound and interpretation by Clearance Coots, MD      ASSESSMENT & PLAN:   Dislocation of left patella Ongoing pain and swelling. Feels unstable without brace.  - MRi to evaluated for internal derangement  - continue brace  - will finish FMLA  - provided work note for return    Chronic pain of right knee Acute on chronic. Significant trauma in 2012 and now having pain and mechanical symptoms  - xray  - MRI to evaluate for meniscal tear or chondromalacia  - counseled on HEP and supportive care

## 2018-12-28 NOTE — Assessment & Plan Note (Signed)
Ongoing pain and swelling. Feels unstable without brace.  - MRi to evaluated for internal derangement  - continue brace  - will finish FMLA  - provided work note for return

## 2018-12-29 ENCOUNTER — Ambulatory Visit: Payer: Self-pay | Admitting: Family Medicine

## 2018-12-30 ENCOUNTER — Telehealth: Payer: Self-pay | Admitting: Family Medicine

## 2018-12-30 ENCOUNTER — Encounter: Payer: Self-pay | Admitting: Family Medicine

## 2018-12-30 NOTE — Telephone Encounter (Signed)
Spoke with patient about xrays.   [name], MD [facility] 12/30/2018, 3:44 PM

## 2019-01-09 ENCOUNTER — Encounter: Payer: Self-pay | Admitting: Family Medicine

## 2019-01-09 ENCOUNTER — Ambulatory Visit
Admission: RE | Admit: 2019-01-09 | Discharge: 2019-01-09 | Disposition: A | Discharge status: Home / Self Care | Payer: 59 | Source: Ambulatory Visit | Attending: Family Medicine | Admitting: Family Medicine

## 2019-01-09 ENCOUNTER — Other Ambulatory Visit: Payer: Self-pay | Admitting: Family Medicine

## 2019-01-09 DIAGNOSIS — S83012A Lateral subluxation of left patella, initial encounter: Secondary | ICD-10-CM | POA: Diagnosis not present

## 2019-01-09 DIAGNOSIS — G8929 Other chronic pain: Secondary | ICD-10-CM

## 2019-01-09 DIAGNOSIS — S83005D Unspecified dislocation of left patella, subsequent encounter: Secondary | ICD-10-CM

## 2019-01-09 DIAGNOSIS — M25561 Pain in right knee: Principal | ICD-10-CM

## 2019-01-09 DIAGNOSIS — M25461 Effusion, right knee: Secondary | ICD-10-CM | POA: Diagnosis not present

## 2019-01-09 MED ORDER — AMPHETAMINE-DEXTROAMPHET ER 30 MG PO CP24: 30.0000 mg | ORAL_CAPSULE | Freq: Every day | ORAL | 0 refills | Status: DC

## 2019-01-10 ENCOUNTER — Telehealth: Payer: Self-pay | Admitting: Family Medicine

## 2019-01-10 ENCOUNTER — Encounter: Payer: Self-pay | Admitting: Family Medicine

## 2019-01-10 DIAGNOSIS — H5213 Myopia, bilateral: Secondary | ICD-10-CM | POA: Diagnosis not present

## 2019-01-10 DIAGNOSIS — S83005D Unspecified dislocation of left patella, subsequent encounter: Secondary | ICD-10-CM

## 2019-01-10 DIAGNOSIS — H52221 Regular astigmatism, right eye: Secondary | ICD-10-CM | POA: Diagnosis not present

## 2019-01-10 NOTE — Telephone Encounter (Signed)
Spoke with patient about left and right knee MRI. Will place vitamin D. Can try K2 to help heal fracture.    [name], MD [facility] 01/10/2019, 5:01 PM

## 2019-01-11 ENCOUNTER — Encounter: Payer: Self-pay | Admitting: Family Medicine

## 2019-01-17 MED FILL — ADDERALL XR 30 MG CAP SA: 30 | 30 days supply | Qty: 30 | Fill #0

## 2019-01-23 ENCOUNTER — Telehealth: Payer: Self-pay | Admitting: Family Medicine

## 2019-01-23 NOTE — Telephone Encounter (Signed)
Copied from [place] [phone]. Topic: Quick Communication - See Telephone Encounter >> Jan 23, 2019  3:40 PM Ivar Drape wrote: CRM for notification. See Telephone encounter for: 01/23/19. Patient would like for the letter that was dated 01/19/2019 and written for him by Dr. Raeford Razor to be sent to his MyChart so he can print it off.

## 2019-01-24 NOTE — Telephone Encounter (Signed)
Almyra Free faxed this letter written by Dr. Raeford Razor on 01/19/2019 to pt. Place of work.

## 2019-01-30 ENCOUNTER — Encounter: Payer: Self-pay | Admitting: Family Medicine

## 2019-02-09 ENCOUNTER — Encounter: Payer: Self-pay | Admitting: Family Medicine

## 2019-02-09 ENCOUNTER — Other Ambulatory Visit: Payer: Self-pay

## 2019-02-09 MED ORDER — AMPHETAMINE-DEXTROAMPHET ER 30 MG PO CP24: 30.0000 mg | ORAL_CAPSULE | Freq: Every day | ORAL | 0 refills | Status: DC

## 2019-02-09 NOTE — Telephone Encounter (Signed)
Sent to Dr. Deborra Medina for Auth/Approval

## 2019-02-09 NOTE — Telephone Encounter (Signed)
I have sent it in electronically.

## 2019-02-09 NOTE — Telephone Encounter (Signed)
Pt called . Has 8 days left of Rx Adderall XR 30 Mg . Pt requested to be sent to [place] . Please advise.

## 2019-02-10 ENCOUNTER — Other Ambulatory Visit: Payer: Self-pay | Admitting: Family Medicine

## 2019-02-10 MED FILL — ESOMEPRAZOLE MAG DR 40 MG C: 40 | 90 days supply | Qty: 90 | Fill #0

## 2019-02-13 MED FILL — valACYclovir HCL 1 GM TABS: 1 | 90 days supply | Qty: 90 | Fill #0

## 2019-03-08 ENCOUNTER — Telehealth: Payer: Self-pay | Admitting: Family Medicine

## 2019-03-08 NOTE — Telephone Encounter (Signed)
Called pt on behalf of Dr Deborra Medina to see if he wanted to go ahead and schedule appt for ADHD out for August 2020, per DMF this is when he is next due for an appt, pt said he doesn't have schedule for out that far yet so he will call back at a later date to schedule appt.

## 2019-04-13 ENCOUNTER — Encounter: Payer: Self-pay | Admitting: Family Medicine

## 2019-04-13 ENCOUNTER — Other Ambulatory Visit: Payer: Self-pay

## 2019-04-13 MED ORDER — AMPHETAMINE-DEXTROAMPHET ER 30 MG PO CP24: ORAL_CAPSULE | ORAL | 0 refills | Status: DC

## 2019-04-13 MED FILL — ADDERALL XR 30 MG CAP SA: 30 | 30 days supply | Qty: 30 | Fill #0

## 2019-04-13 NOTE — Telephone Encounter (Signed)
TA-Pt req refill for Adderall/is due for OV end of July/Per [place] PMP pt is compliant without red flags/June & July Rx's prepared with note to sched OV end of July and are pending/thx dmf

## 2019-05-05 MED FILL — ESOMEPRAZOLE MAG DR 40 MG C: 40 | 90 days supply | Qty: 90 | Fill #1

## 2019-05-08 ENCOUNTER — Other Ambulatory Visit: Payer: Self-pay | Admitting: Family Medicine

## 2019-05-08 MED FILL — valACYclovir HCL 1 GM TABS: 1 | 90 days supply | Qty: 90 | Fill #0

## 2019-05-11 MED FILL — ADDERALL XR 30 MG CAP SA: 30 | 30 days supply | Qty: 30 | Fill #0

## 2019-05-12 ENCOUNTER — Other Ambulatory Visit: Payer: Self-pay | Admitting: Family Medicine

## 2019-05-22 ENCOUNTER — Encounter: Payer: Self-pay | Admitting: Family Medicine

## 2019-05-23 ENCOUNTER — Ambulatory Visit (INDEPENDENT_AMBULATORY_CARE_PROVIDER_SITE_OTHER): Payer: 59 | Admitting: Family Medicine

## 2019-05-23 ENCOUNTER — Telehealth: Payer: Self-pay

## 2019-05-23 DIAGNOSIS — F900 Attention-deficit hyperactivity disorder, predominantly inattentive type: Secondary | ICD-10-CM

## 2019-05-23 MED ORDER — AMPHETAMINE-DEXTROAMPHET ER 30 MG PO CP24: ORAL_CAPSULE | ORAL | 0 refills | Status: DC

## 2019-05-23 MED ORDER — AMPHETAMINE-DEXTROAMPHET ER 30 MG PO CP24: 30.0000 mg | ORAL_CAPSULE | ORAL | 0 refills | Status: DC

## 2019-05-23 NOTE — Telephone Encounter (Signed)
LMOVM for pt to call [phone] to go through check-in process/thx dmf

## 2019-05-23 NOTE — Assessment & Plan Note (Signed)
Symptoms well controlled.  Not due for another UDS.  PDMP reviewed- No red flags. Pleased with current dose. eRx refills sent.

## 2019-05-23 NOTE — Progress Notes (Signed)
TELEPHONE ENCOUNTER   Patient verbally agreed to telephone visit and is aware that copayment and coinsurance may apply. Patient was treated using telemedicine according to accepted telemedicine protocols.  Location of the patient: work  Location of provider: provider's home Names of all persons participating in the telemedicine service and role in the encounter: Arnette Norris, MD Kathline Magic  Subjective:   Chief Complaint  Patient presents with  . ADHD    Pt agrees to virtual visit. He is needing to F/U with his ADHD.  He currently takes Adderall XR 30mg  1qd and per [place] PMP is compliant without red flags. He is happy with current dosing and would like to continue.  He is aware that he will have to come in and submit UDS & sign CSC.     HPI   ADHD- He currently takes Adderall XR 30mg  1qd and per [place] PMP is compliant without red flags. He is happy with current dosing and would like to continue.   UDS and CSC UTD.  Patient Active Problem List   Diagnosis Date Noted  . Chronic pain of right knee 12/28/2018  . Dislocation of left patella 12/22/2018  . ED (erectile dysfunction) 05/02/2018  . GENITAL HERPES 04/05/2009  . Attention deficit hyperactivity disorder (ADHD) 04/05/2009  . INSOMNIA 03/22/2008  . ASTHMA 02/25/2008  . ALLERGY 02/25/2008   Social History   Tobacco Use  . Smoking status: Never Smoker  . Smokeless tobacco: Never Used  Substance Use Topics  . Alcohol use: Yes    Comment: occasionally    Current Outpatient Medications:  .  albuterol (PROVENTIL HFA;VENTOLIN HFA) 108 (90 BASE) MCG/ACT inhaler, Inhale 2 puffs into the lungs every 6 (six) hours as needed for wheezing or shortness of breath., Disp: 1 Inhaler, Rfl: 0 .  amphetamine-dextroamphetamine (ADDERALL XR) 30 MG 24 hr capsule, Take 1qd, Disp: 31 capsule, Rfl: 0 .  amphetamine-dextroamphetamine (ADDERALL XR) 30 MG 24 hr capsule, TAKE 1 CAPSULE BY MOUTH ONCE DAILY, Disp: 31 capsule, Rfl: 0 .   amphetamine-dextroamphetamine (ADDERALL XR) 30 MG 24 hr capsule, Take 1 capsule (30 mg total) by mouth every morning., Disp: 31 capsule, Rfl: 0 .  cetirizine (ZYRTEC) 10 MG tablet, Take 10 mg by mouth daily as needed for allergies., Disp: , Rfl:  .  cyclobenzaprine (FLEXERIL) 5 MG tablet, Take 1 tablet (5 mg total) by mouth daily as needed for muscle spasms., Disp: 30 tablet, Rfl: 0 .  esomeprazole (NEXIUM) 40 MG capsule, TAKE 1 CAPSULE BY MOUTH DAILY AT 12 NOON, Disp: 90 capsule, Rfl: 5 .  tiZANidine (ZANAFLEX) 4 MG capsule, Take 1 capsule (4 mg total) by mouth 3 (three) times daily as needed for muscle spasms., Disp: 270 capsule, Rfl: 5 .  traMADol (ULTRAM) 50 MG tablet, Take 1 tablet (50 mg total) by mouth every 6 (six) hours as needed., Disp: 15 tablet, Rfl: 0 .  valACYclovir (VALTREX) 1000 MG tablet, TAKE 1 TABLET BY MOUTH DAILY., Disp: 90 tablet, Rfl: 0 No Known Allergies  Assessment & Plan:   1. Attention deficit hyperactivity disorder (ADHD), predominantly inattentive type     Orders Placed This Encounter  Procedures  . Pain Mgmt, Profile 8 w/Conf, U   Meds ordered this encounter  Medications  . amphetamine-dextroamphetamine (ADDERALL XR) 30 MG 24 hr capsule    Sig: Take 1qd    Dispense:  31 capsule    Refill:  0    August  . amphetamine-dextroamphetamine (ADDERALL XR) 30 MG 24 hr capsule  Sig: TAKE 1 CAPSULE BY MOUTH ONCE DAILY    Dispense:  31 capsule    Refill:  0    September  . amphetamine-dextroamphetamine (ADDERALL XR) 30 MG 24 hr capsule    Sig: Take 1 capsule (30 mg total) by mouth every morning.    Dispense:  31 capsule    Refill:  0    October     , MD 05/23/2019  Time spent with the patient: 10 minutes, spent in obtaining information about his symptoms, reviewing his previous labs, evaluations, and treatments, counseling him about his condition (please see the discussed topics above), and developing a plan to further investigate it; he had a  number of questions which I addressed.   [id number] physician/qualified health professional telephone evaluation 5 to 10 minutes [id number] physician/qualified help functional [place] evaluation for 11 to 20 minutes [id number] physician/qualify he will professional telephone evaluation for 21 to 30 minutes

## 2019-06-09 MED FILL — ADDERALL XR 30 MG CAP SA: 30 | 30 days supply | Qty: 30 | Fill #0

## 2019-07-07 ENCOUNTER — Encounter: Payer: Self-pay | Admitting: Family Medicine

## 2019-07-12 ENCOUNTER — Telehealth: Payer: Self-pay

## 2019-07-12 NOTE — Telephone Encounter (Signed)

## 2019-07-13 ENCOUNTER — Other Ambulatory Visit (INDEPENDENT_AMBULATORY_CARE_PROVIDER_SITE_OTHER): Payer: 59

## 2019-07-13 DIAGNOSIS — F900 Attention-deficit hyperactivity disorder, predominantly inattentive type: Secondary | ICD-10-CM

## 2019-07-15 LAB — PAIN MGMT, PROFILE 8 W/CONF, U
6 Acetylmorphine: NEGATIVE ng/mL
Alcohol Metabolites: POSITIVE ng/mL — AB (ref <500)
Amphetamine: 1236 ng/mL
Amphetamines: POSITIVE ng/mL
Benzodiazepines: NEGATIVE ng/mL
Buprenorphine, Urine: NEGATIVE ng/mL
Cocaine Metabolite: NEGATIVE ng/mL
Creatinine: 89.1 mg/dL
Ethyl Glucuronide (ETG): 1880 ng/mL
Ethyl Sulfate (ETS): 412 ng/mL
MDMA: NEGATIVE ng/mL
Marijuana Metabolite: NEGATIVE ng/mL
Methamphetamine: NEGATIVE ng/mL
Opiates: NEGATIVE ng/mL
Oxidant: NEGATIVE ug/mL
Oxycodone: NEGATIVE ng/mL
pH: 7.5 (ref 4.5–9.0)

## 2019-08-02 ENCOUNTER — Other Ambulatory Visit: Payer: Self-pay | Admitting: Family Medicine

## 2019-08-02 MED FILL — valACYclovir HCL 1 GM TABS: 1 | 90 days supply | Qty: 90 | Fill #0

## 2019-08-04 MED FILL — ESOMEPRAZOLE MAG DR 40 MG C: 40 | 90 days supply | Qty: 90 | Fill #2

## 2019-10-11 ENCOUNTER — Encounter: Payer: Self-pay | Admitting: Family Medicine

## 2019-10-11 ENCOUNTER — Other Ambulatory Visit: Payer: Self-pay | Admitting: Family Medicine

## 2019-10-11 MED ORDER — AMPHETAMINE-DEXTROAMPHET ER 30 MG PO CP24: 30.0000 mg | ORAL_CAPSULE | ORAL | 0 refills | Status: DC

## 2019-10-11 MED ORDER — AMPHETAMINE-DEXTROAMPHET ER 30 MG PO CP24: ORAL_CAPSULE | ORAL | 0 refills | Status: DC

## 2019-10-11 NOTE — Telephone Encounter (Signed)
Last OV 05/23/19 Last fill 05/23/19  #31/0

## 2019-10-11 NOTE — Telephone Encounter (Signed)
  Medication has been sent today already.

## 2019-11-09 ENCOUNTER — Other Ambulatory Visit: Payer: Self-pay | Admitting: Family Medicine

## 2019-11-09 NOTE — Telephone Encounter (Signed)
Last OV 05/23/19 Last fill 11/21/18  #90/5 Pt need a annual wellness visit

## 2019-11-13 ENCOUNTER — Other Ambulatory Visit: Payer: Self-pay | Admitting: Family Medicine

## 2019-11-13 NOTE — Telephone Encounter (Signed)
Last OV 05/23/19 Last fill 11/09/19  #90/0

## 2019-12-12 ENCOUNTER — Encounter: Payer: Self-pay | Admitting: Family Medicine

## 2019-12-13 ENCOUNTER — Other Ambulatory Visit: Payer: Self-pay

## 2019-12-13 NOTE — Telephone Encounter (Signed)
TA-Pt requesting refills on Adderall XR 30mg /Per [place] PMP pt is compliant without red flags/I have prepared and pended for your approval/thx dmf

## 2019-12-14 MED ORDER — AMPHETAMINE-DEXTROAMPHET ER 30 MG PO CP24: ORAL_CAPSULE | ORAL | 0 refills | Status: DC

## 2019-12-14 MED ORDER — AMPHETAMINE-DEXTROAMPHET ER 30 MG PO CP24: 30.0000 mg | ORAL_CAPSULE | ORAL | 0 refills | Status: DC

## 2020-01-18 DIAGNOSIS — H52221 Regular astigmatism, right eye: Secondary | ICD-10-CM | POA: Diagnosis not present

## 2020-01-18 DIAGNOSIS — H5213 Myopia, bilateral: Secondary | ICD-10-CM | POA: Diagnosis not present

## 2020-02-22 ENCOUNTER — Other Ambulatory Visit: Payer: Self-pay | Admitting: *Deleted

## 2020-02-22 DIAGNOSIS — I83893 Varicose veins of bilateral lower extremities with other complications: Secondary | ICD-10-CM

## 2020-02-28 ENCOUNTER — Encounter: Payer: 59 | Admitting: Vascular Surgery

## 2020-02-28 ENCOUNTER — Encounter (HOSPITAL_COMMUNITY): Payer: 59

## 2020-03-19 ENCOUNTER — Encounter: Payer: Self-pay | Admitting: Family Medicine

## 2020-03-19 ENCOUNTER — Other Ambulatory Visit: Payer: Self-pay

## 2020-03-19 ENCOUNTER — Ambulatory Visit: Payer: 59 | Admitting: Family Medicine

## 2020-03-19 VITALS — BP 142/88 | HR 98 | Temp 98.1°F | Ht 67.0 in | Wt 175.2 lb

## 2020-03-19 DIAGNOSIS — A6002 Herpesviral infection of other male genital organs: Secondary | ICD-10-CM | POA: Diagnosis not present

## 2020-03-19 DIAGNOSIS — F900 Attention-deficit hyperactivity disorder, predominantly inattentive type: Secondary | ICD-10-CM

## 2020-03-19 DIAGNOSIS — Z7289 Other problems related to lifestyle: Secondary | ICD-10-CM

## 2020-03-19 DIAGNOSIS — K219 Gastro-esophageal reflux disease without esophagitis: Secondary | ICD-10-CM | POA: Diagnosis not present

## 2020-03-19 DIAGNOSIS — Z789 Other specified health status: Secondary | ICD-10-CM

## 2020-03-19 MED ORDER — AMPHETAMINE-DEXTROAMPHET ER 30 MG PO CP24: 30.0000 mg | ORAL_CAPSULE | Freq: Every day | ORAL | 0 refills | Status: DC

## 2020-03-19 MED ORDER — AMPHETAMINE-DEXTROAMPHET ER 30 MG PO CP24: ORAL_CAPSULE | ORAL | 0 refills | Status: DC

## 2020-03-19 MED ORDER — VALACYCLOVIR HCL 1 G PO TABS
1000.0000 mg | ORAL_TABLET | Freq: Every day | ORAL | 0 refills | Status: DC
  Filled 2021-02-14: qty 90, 90d supply, fill #0

## 2020-03-19 MED ORDER — ESOMEPRAZOLE MAGNESIUM 40 MG PO CPDR: 40.0000 mg | DELAYED_RELEASE_CAPSULE | Freq: Every day | ORAL | 2 refills | Status: DC

## 2020-03-19 NOTE — Assessment & Plan Note (Addendum)
Diagnosed in college. Has been doing well on Adderall. Refill x 3 months today. Will update contract at next visit

## 2020-03-19 NOTE — Patient Instructions (Addendum)
Great to meet you!  I'll see you back in a few weeks for a annual exam.

## 2020-03-19 NOTE — Assessment & Plan Note (Signed)
Advised cutting back to daily recommendations. He will consider

## 2020-03-19 NOTE — Assessment & Plan Note (Signed)
Cont suppression as it is effective. One partner.

## 2020-03-19 NOTE — Assessment & Plan Note (Signed)
Has failed to stop medication or take lower doses. Discussed that GI referral may be helpful to rule out more serious causes for symptoms. He will consider. Refill provideded

## 2020-03-19 NOTE — Progress Notes (Signed)
   Subjective:     Bradley Schaefer is a 39 y.o. male presenting for Medication Management (medication refill)     HPI  #genital herpes - taking valacyclovir  - for suppression - no outbreaks in the last year  #GERD - coffee and beer are biggest triggers -   #ADHD - controlled on Adderall  #white coat - 120/70s when checking at home  Review of Systems   Social History   Tobacco Use  Smoking Status Never Smoker  Smokeless Tobacco Never Used        Objective:    BP Readings from Last 3 Encounters:  03/19/20 (!) 142/88  12/28/18 (!) 140/100  12/22/18 (!) 120/100   Wt Readings from Last 3 Encounters:  03/19/20 175 lb 4 oz (79.5 kg)  12/28/18 177 lb 3.2 oz (80.4 kg)  05/02/18 171 lb 3.2 oz (77.7 kg)    BP (!) 142/88   Pulse 98   Temp 98.1 F (36.7 C)   Ht 5\' 7"  (1.702 m)   Wt 175 lb 4 oz (79.5 kg)   SpO2 98%   BMI 27.45 kg/m    Physical Exam Constitutional:      Appearance: Normal appearance. He is not ill-appearing or diaphoretic.  HENT:     Right Ear: External ear normal.     Left Ear: External ear normal.     Nose: Nose normal.  Eyes:     General: No scleral icterus.    Extraocular Movements: Extraocular movements intact.     Conjunctiva/sclera: Conjunctivae normal.  Cardiovascular:     Rate and Rhythm: Normal rate.  Pulmonary:     Effort: Pulmonary effort is normal.  Musculoskeletal:     Cervical back: Neck supple.  Skin:    General: Skin is warm and dry.  Neurological:     Mental Status: He is alert. Mental status is at baseline.  Psychiatric:        Mood and Affect: Mood normal.           Assessment & Plan:   Problem List Items Addressed This Visit      Digestive   Gastroesophageal reflux disease    Has failed to stop medication or take lower doses. Discussed that GI referral may be helpful to rule out more serious causes for symptoms. He will consider. Refill provideded      Relevant Medications   esomeprazole (NEXIUM)  40 MG capsule     Genitourinary   Herpes genitalis in men - Primary    Cont suppression as it is effective. One partner.       Relevant Medications   valACYclovir (VALTREX) 1000 MG tablet     Other   Attention deficit hyperactivity disorder (ADHD)    Diagnosed in college. Has been doing well on Adderall. Refill x 3 months today. Will update contract at next visit      Relevant Medications   amphetamine-dextroamphetamine (ADDERALL XR) 30 MG 24 hr capsule   amphetamine-dextroamphetamine (ADDERALL XR) 30 MG 24 hr capsule   amphetamine-dextroamphetamine (ADDERALL XR) 30 MG 24 hr capsule   Alcohol use    Advised cutting back to daily recommendations. He will consider          Return in about 3 months (around 06/19/2020).  Lesleigh Noe, MD

## 2020-03-27 ENCOUNTER — Ambulatory Visit (INDEPENDENT_AMBULATORY_CARE_PROVIDER_SITE_OTHER): Payer: 59 | Admitting: Vascular Surgery

## 2020-03-27 ENCOUNTER — Other Ambulatory Visit: Payer: Self-pay

## 2020-03-27 ENCOUNTER — Ambulatory Visit (HOSPITAL_COMMUNITY)
Admission: RE | Admit: 2020-03-27 | Discharge: 2020-03-27 | Disposition: A | Discharge status: Home / Self Care | Payer: 59 | Source: Ambulatory Visit | Attending: Vascular Surgery | Admitting: Vascular Surgery

## 2020-03-27 ENCOUNTER — Encounter: Payer: Self-pay | Admitting: Vascular Surgery

## 2020-03-27 VITALS — BP 136/92 | HR 92 | Temp 97.5°F | Resp 18 | Ht 67.0 in | Wt 173.0 lb

## 2020-03-27 DIAGNOSIS — I83812 Varicose veins of left lower extremities with pain: Secondary | ICD-10-CM

## 2020-03-27 DIAGNOSIS — I83893 Varicose veins of bilateral lower extremities with other complications: Secondary | ICD-10-CM | POA: Insufficient documentation

## 2020-03-27 NOTE — Progress Notes (Signed)
Patient name: Bradley Schaefer MRN: [id number] DOB: 1981-07-21 Sex: male  HPI: Bradley Schaefer is a 39 y.o. male, who I previously saw about 8 years ago for symptomatic varicose veins in the left leg.  We were considering laser ablation at that point but he opted for compression stockings.  He has now been wearing the compression stockings for almost 8 years and really has had no symptomatic relief.  The veins have become more dilated over time.  He complains primarily of itching around a cluster of varicosities at the left medial ankle.  He also has heaviness fullness and aching that occurs in the leg at the end of the day.  He has family significant for varicose veins in his grandmother.  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  His activities have also been limited due to pain from his varicose veins.    Past Medical History:  Diagnosis Date  . ADD (attention deficit disorder)   . Asthma   . Dislocation of left patella 12/22/2018  . GERD (gastroesophageal reflux disease)   . Herpes simplex   . Varicose veins    History reviewed. No pertinent surgical history.  History reviewed. No pertinent family history.  SOCIAL HISTORY: Social History   Socioeconomic History  . Marital status: Married    Spouse name: Not on file  . Number of children: Not on file  . Years of education: Not on file  . Highest education level: Not on file  Occupational History  . Not on file  Tobacco Use  . Smoking status: Never Smoker  . Smokeless tobacco: Never Used  Substance and Sexual Activity  . Alcohol use: Yes    Comment: drinking 3-4 beers daily  . Drug use: No  . Sexual activity: Yes    Birth control/protection: I.U.D.  Other Topics Concern  . Not on file  Social History Narrative  . Not on file   Social Determinants of Health   Financial Resource Strain:   . Difficulty of Paying Living Expenses:   Food Insecurity:   . Worried About Charity fundraiser in the Last Year:   . Arboriculturist in  the Last Year:   Transportation Needs:   . Film/video editor (Medical):   Marland Kitchen Lack of Transportation (Non-Medical):   Physical Activity:   . Days of Exercise per Week:   . Minutes of Exercise per Session:   Stress:   . Feeling of Stress :   Social Connections:   . Frequency of Communication with Friends and Family:   . Frequency of Social Gatherings with Friends and Family:   . Attends Religious Services:   . Active Member of Clubs or Organizations:   . Attends Archivist Meetings:   Marland Kitchen Marital Status:   Intimate Partner Violence:   . Fear of Current or Ex-Partner:   . Emotionally Abused:   Marland Kitchen Physically Abused:   . Sexually Abused:     No Known Allergies  Current Outpatient Medications  Medication Sig Dispense Refill  . albuterol (PROVENTIL HFA;VENTOLIN HFA) 108 (90 BASE) MCG/ACT inhaler Inhale 2 puffs into the lungs every 6 (six) hours as needed for wheezing or shortness of breath. 1 Inhaler 0  . amphetamine-dextroamphetamine (ADDERALL XR) 30 MG 24 hr capsule Take 1 capsule (30 mg total) by mouth every morning. 31 capsule 0  . cetirizine (ZYRTEC) 10 MG tablet Take 10 mg by mouth daily as needed for allergies.    Marland Kitchen  cyclobenzaprine (FLEXERIL) 5 MG tablet Take 1 tablet (5 mg total) by mouth daily as needed for muscle spasms. 30 tablet 0  . esomeprazole (NEXIUM) 40 MG capsule Take 1 capsule (40 mg total) by mouth daily at 12 noon. 90 capsule 2  . tiZANidine (ZANAFLEX) 4 MG capsule Take 1 capsule (4 mg total) by mouth 3 (three) times daily as needed for muscle spasms. 270 capsule 5  . valACYclovir (VALTREX) 1000 MG tablet Take 1 tablet (1,000 mg total) by mouth daily. 90 tablet 2  . amphetamine-dextroamphetamine (ADDERALL XR) 30 MG 24 hr capsule Take 1 capsule (30 mg total) by mouth daily. Take 1qd (Patient not taking: Reported on 03/27/2020) 31 capsule 0  . amphetamine-dextroamphetamine (ADDERALL XR) 30 MG 24 hr capsule TAKE 1 CAPSULE BY MOUTH ONCE DAILY (Patient not  taking: Reported on 03/27/2020) 31 capsule 0  . amphetamine-dextroamphetamine (ADDERALL XR) 30 MG 24 hr capsule Take 1 capsule (30 mg total) by mouth daily. (Patient not taking: Reported on 03/27/2020) 30 capsule 0   No current facility-administered medications for this visit.    ROS:   General:  No weight loss, Fever, chills  HEENT: No recent headaches, no nasal bleeding, no visual changes, no sore throat  Neurologic: No dizziness, blackouts, seizures. No recent symptoms of stroke or mini- stroke. No recent episodes of slurred speech, or temporary blindness.  Cardiac: No recent episodes of chest pain/pressure, no shortness of breath at rest.  No shortness of breath with exertion.  Denies history of atrial fibrillation or irregular heartbeat  Vascular: No history of rest pain in feet.  No history of claudication.  No history of non-healing ulcer, No history of DVT   Pulmonary: No home oxygen, no productive cough, no hemoptysis,  No asthma or wheezing  Musculoskeletal:  [ ]  Arthritis, [ ]  Low back pain,  [ ]  Joint pain  Hematologic:No history of hypercoagulable state.  No history of easy bleeding.  No history of anemia  Gastrointestinal: No hematochezia or melena,  No gastroesophageal reflux, no trouble swallowing  Urinary: [ ]  chronic Kidney disease, [ ]  on HD - [ ]  MWF or [ ]  TTHS, [ ]  Burning with urination, [ ]  Frequent urination, [ ]  Difficulty urinating;   Skin: No rashes  Psychological: No history of anxiety,  No history of depression   Physical Examination  Vitals:   03/27/20 1456 03/27/20 1459  BP: (!) 142/101 (!) 136/92  Pulse: 92   Resp: 18   Temp: (!) 97.5 F (36.4 C)   TempSrc: Temporal   SpO2: 97%   Weight: 173 lb (78.5 kg)   Height: 5\' 7"  (1.702 m)     Body mass index is 27.1 kg/m.  General:  Alert and oriented, no acute distress HEENT: Normal Neck: No JVD Skin: No rash Extremity Pulses:  2+ dorsalis pedis, posterior tibial pulses bilaterally  Musculoskeletal: No deformity or edema  Neurologic: Upper and lower extremity motor 5/5 and symmetric  DATA:  Patient had a venous duplex exam which shows diffuse reflux in the left greater saphenous vein 5 mm diameter from the mid thigh up.  There is a branch point in the mid thigh.  There was no evidence of DVT.  There was no reflux in the lesser saphenous.  I reviewed and interpreted the study.  I confirmed these findings with the SonoSite at the bedside.  ASSESSMENT: Symptomatic varicose veins with pain and swelling burning and itching left leg.  Patient has failed compression therapy as he has  worn compression stockings now for greater than 8 years with really no relief of symptoms and slow worsening of his varicosities.   PLAN: Laser ablation left greater saphenous vein from the mid thigh to the saphenofemoral junction with greater than 20 stab avulsions.  We will schedule this pending insurance approval.  Risk benefits possible complications of procedure details were discussed with patient today including but not limited to nerve injury bleeding DVT.  He understands agrees to proceed.   [name], MD [facility] Office: [phone] Pager: [phone]

## 2020-04-08 IMAGING — DX DG KNEE COMPLETE 4+V*R*
4 series · 4 of 4 positions shown · non-contrast
Comparison: None.

CLINICAL DATA: Chronic left knee pain after injury in 4654.

EXAM:
RIGHT KNEE - COMPLETE 4+ VIEW

[knee ap]
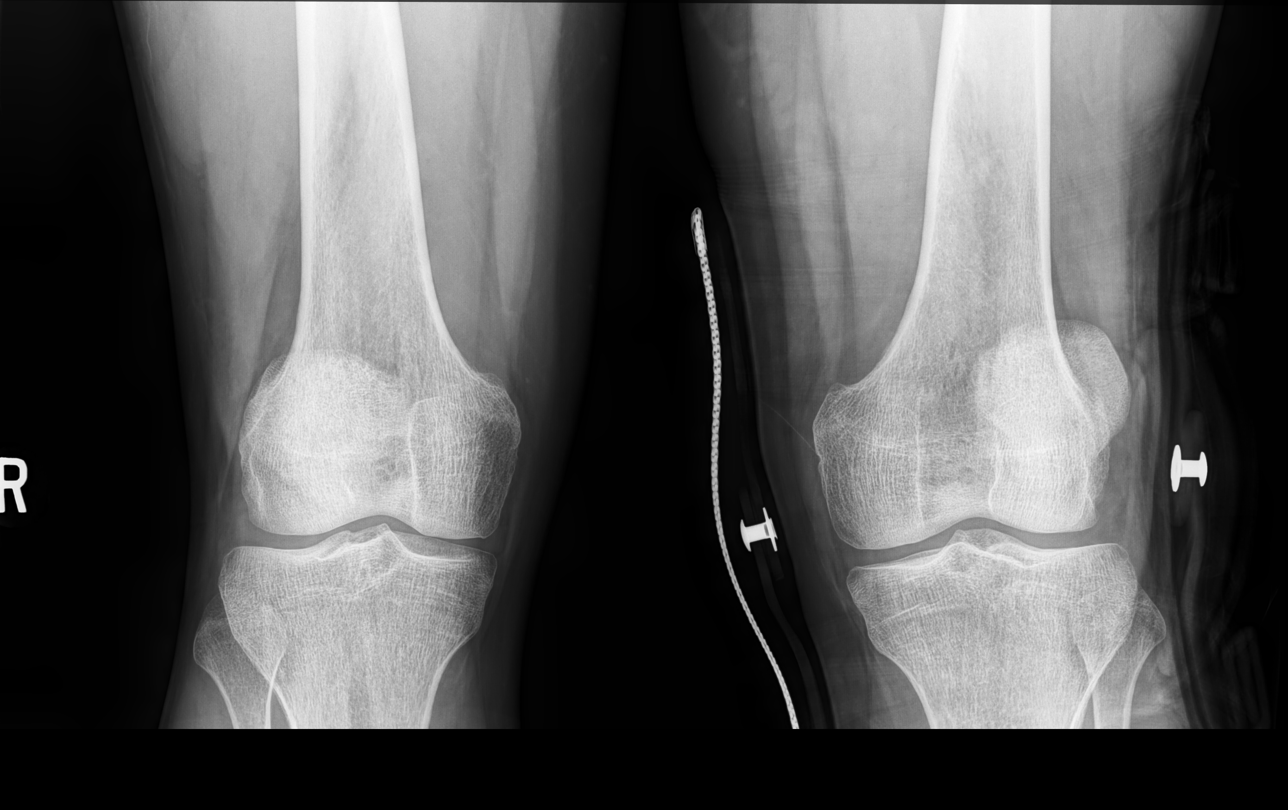

[knee [person_name] view pa]
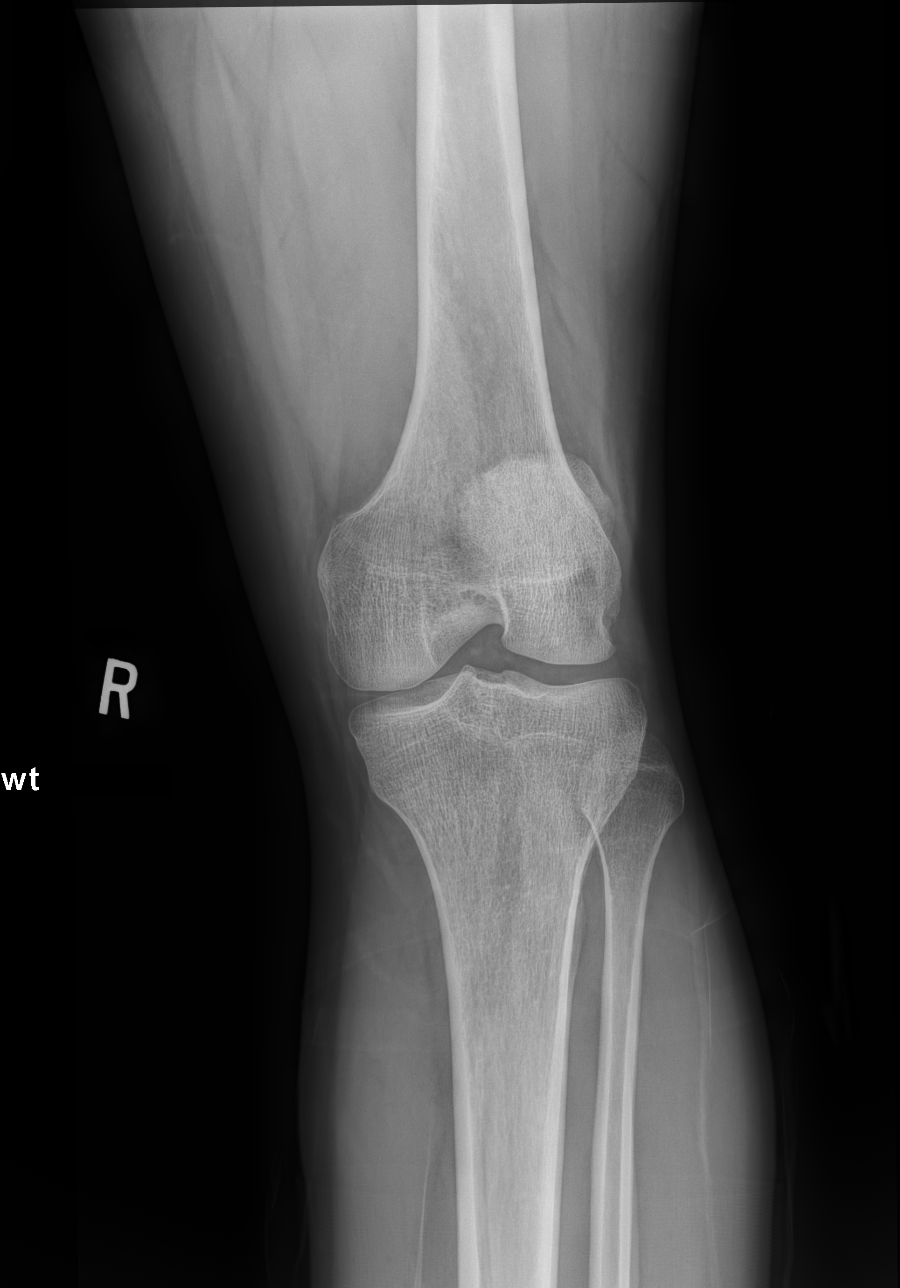

[knee lat]
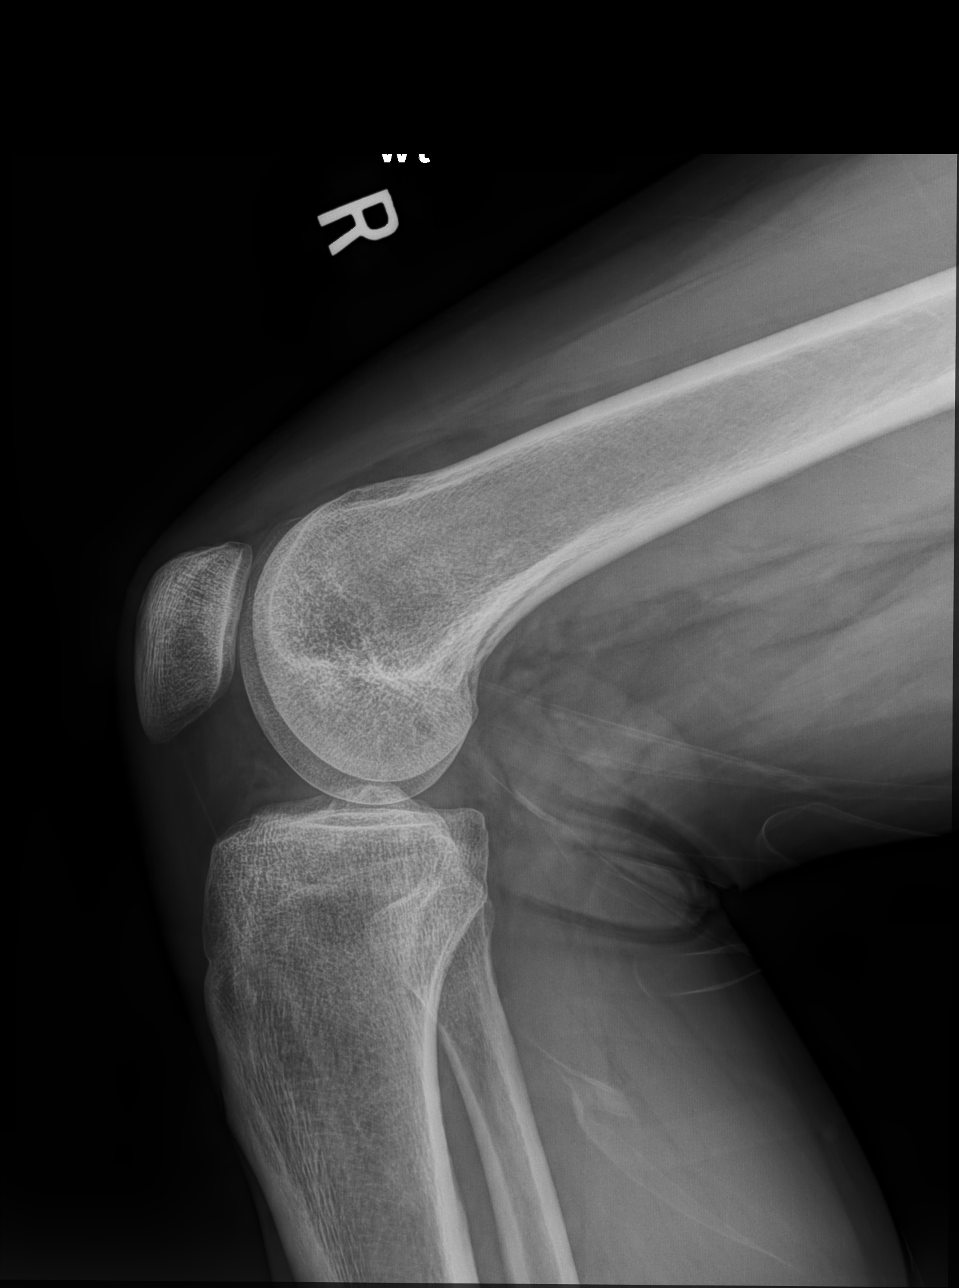

[patella (sunrise)]
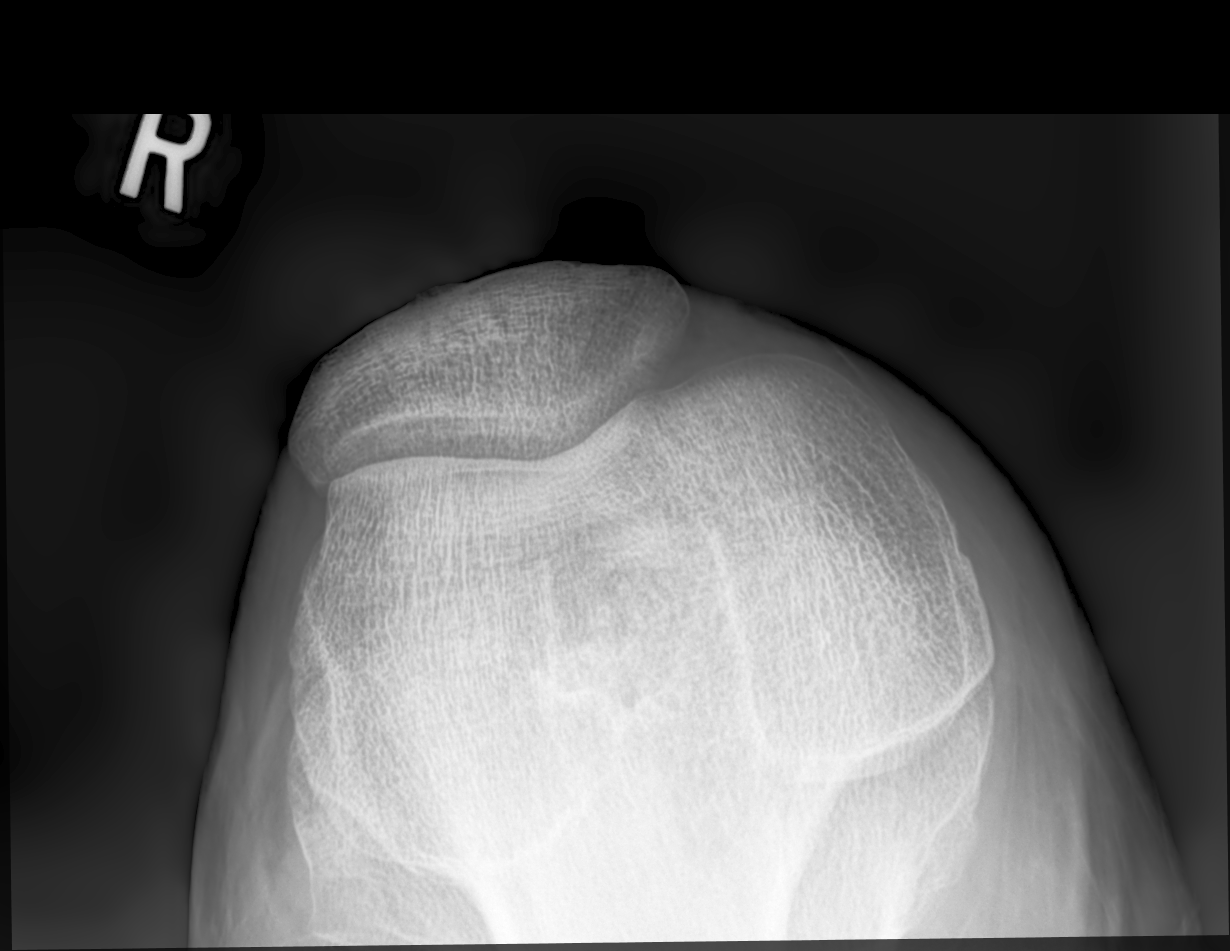

[4 of 4 positions shown; findings below may reference images not displayed]

FINDINGS: No evidence of fracture or dislocation. No significant joint
effusion.
IMPRESSION: No acute findings.

## 2020-04-10 ENCOUNTER — Encounter: Payer: Self-pay | Admitting: Family Medicine

## 2020-04-10 MED ORDER — AMPHETAMINE-DEXTROAMPHET ER 25 MG PO CP24: 25.0000 mg | ORAL_CAPSULE | ORAL | 0 refills | Status: DC

## 2020-04-10 NOTE — Telephone Encounter (Signed)
Please notify pharmacy of the dose change to fill 25 mg and cancel the 30 mg dose

## 2020-04-20 IMAGING — MR MRI OF THE RIGHT KNEE WITHOUT CONTRAST
6 series · 38 of 40 positions shown · non-contrast
Comparison: Radiographs dated 12/28/2018 and MRI dated 09/07/2012

CLINICAL DATA: Right knee pain with locking.

EXAM:
MRI OF THE RIGHT KNEE WITHOUT CONTRAST
TECHNIQUE: Multiplanar, multisequence MR imaging of the knee was performed. No
intravenous contrast was administered.

[Series 6: T2 fat-sat · axial · right · 4.0mm · 0.50mm/px · z∈[-55,+92]mm · 7 of 32 slices shown (1 of 3)]
[im 1/32]
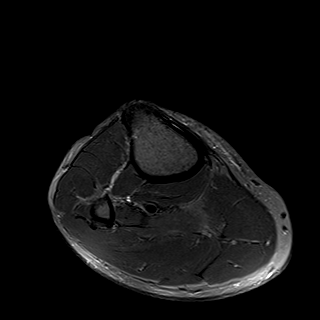
[im 6/32]
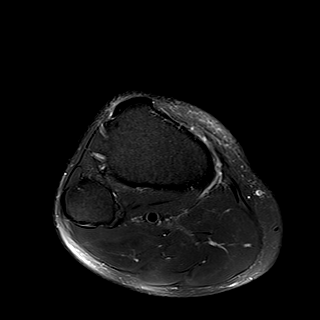
[im 11/32]
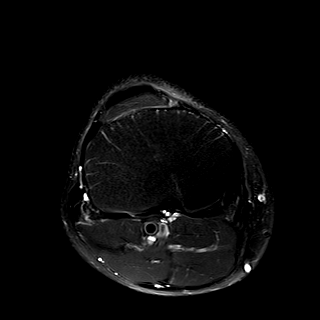
[im 16/32]
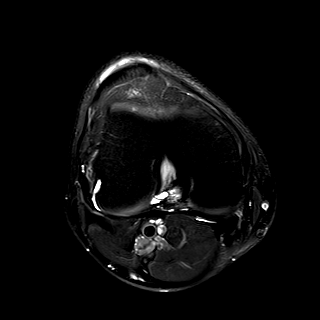
[im 21/32]
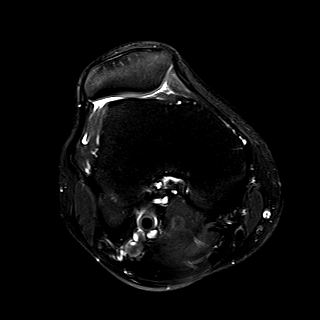
[im 26/32]
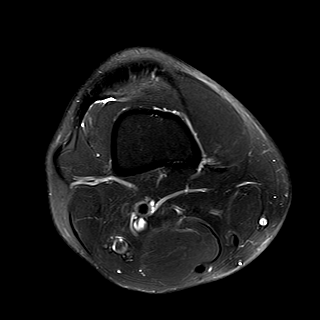
[im 32/32]
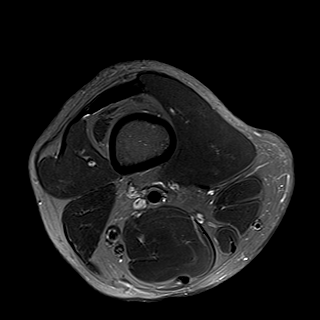

[Series 7: T2 fat-sat · coronal · right · 4.0mm · 0.39mm/px · 6 of 24 slices shown (2 of 3)]
[im 1/24]
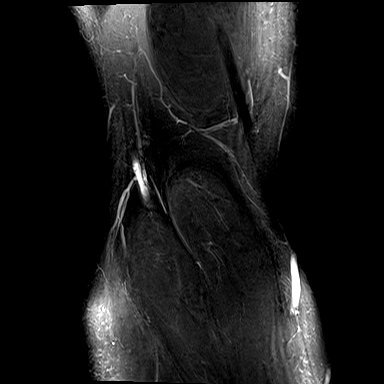
[im 5/24]
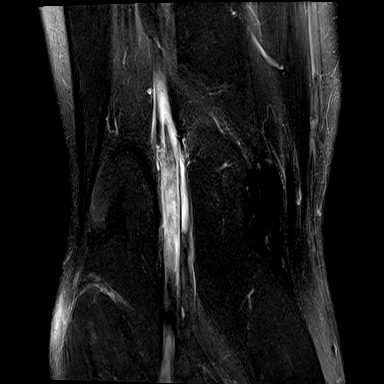
[im 10/24]
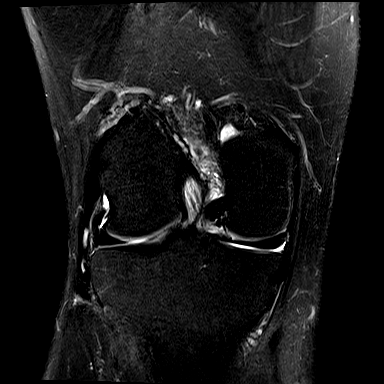
[im 14/24]
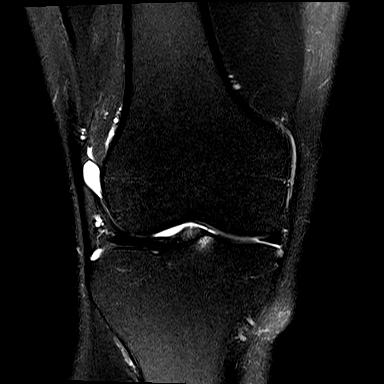
[im 19/24]
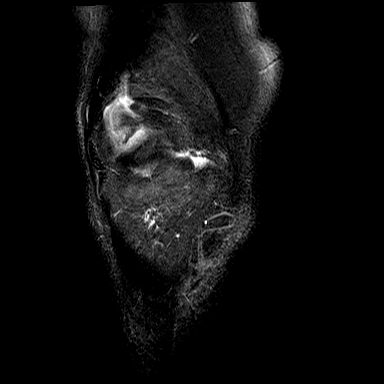
[im 24/24]
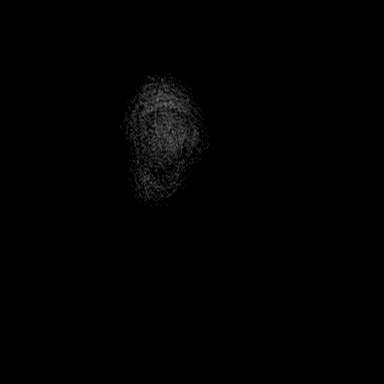

[Series 8: T1 · coronal · right · 4.0mm · 0.39mm/px · 4 of 24 slices shown]
[im 1/24]
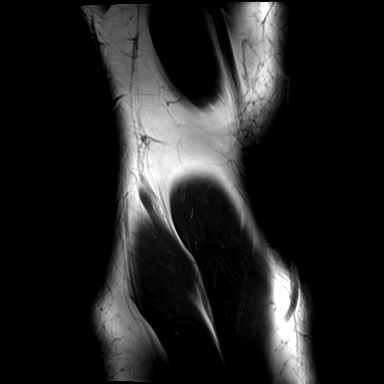
[im 5/24]
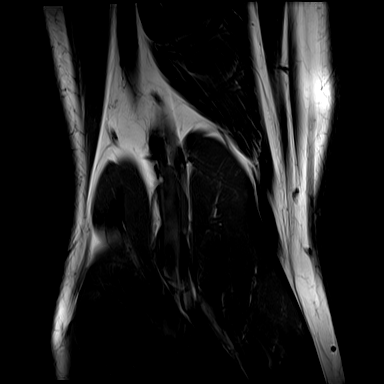
[im 10/24]
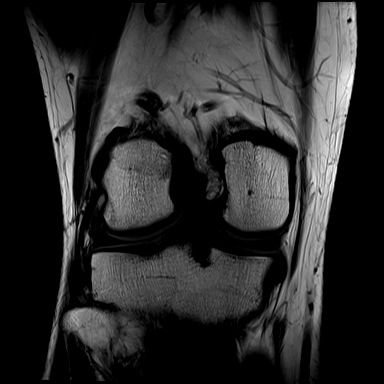
[im 14/24]
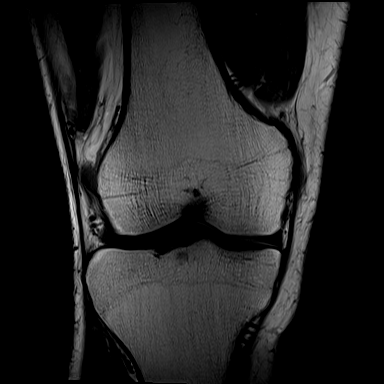

[Series 9: PD fat-sat · coronal · right · 3.0mm · 0.47mm/px · 7 of 30 slices shown (1 of 2)]
[im 1/30]
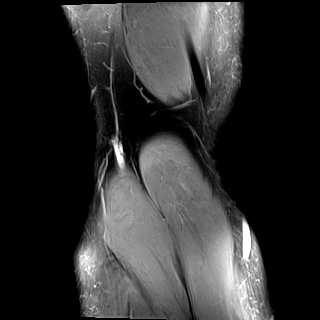
[im 5/30]
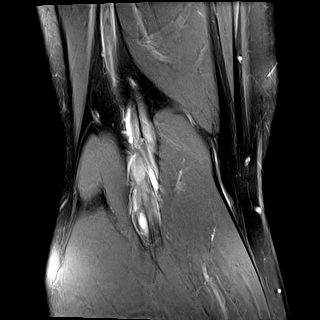
[im 10/30]
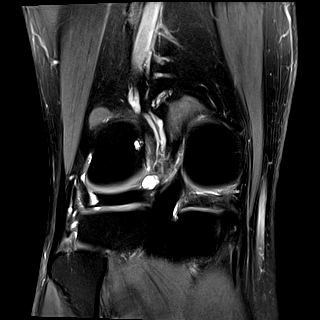
[im 15/30]
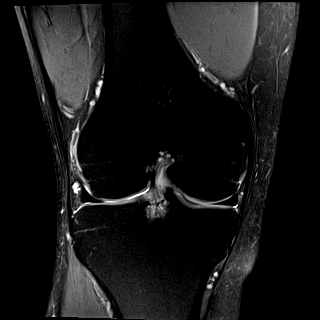
[im 20/30]
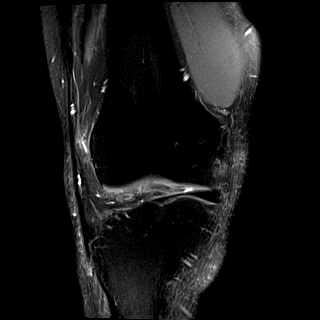
[im 25/30]
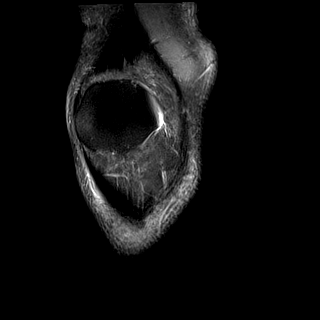
[im 30/30]
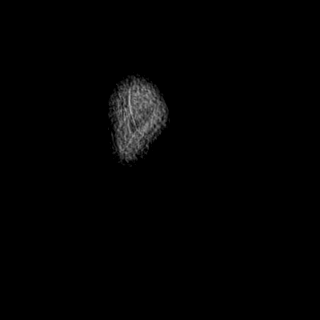

[Series 10: PD fat-sat · sagittal · right · 3.0mm · 0.39mm/px · 7 of 28 slices shown (2 of 2)]
[im 1/28]
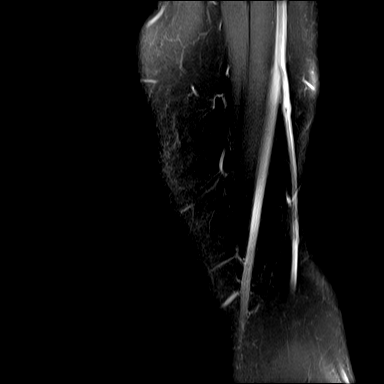
[im 5/28]
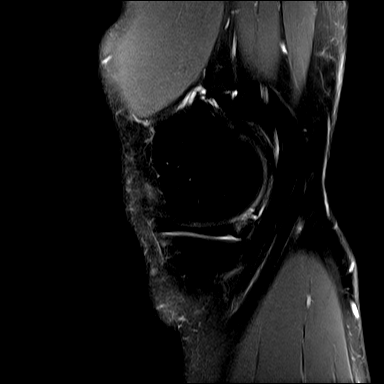
[im 10/28]
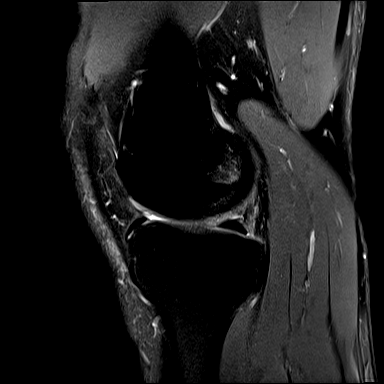
[im 14/28]
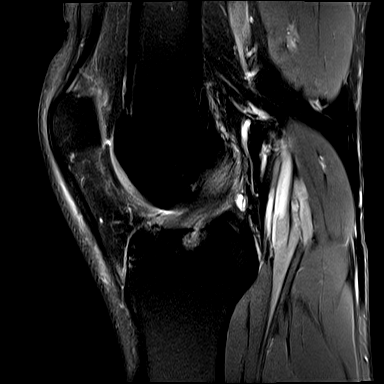
[im 19/28]
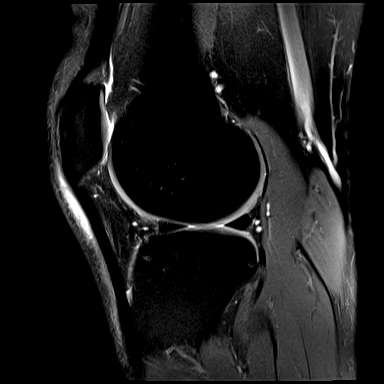
[im 23/28]
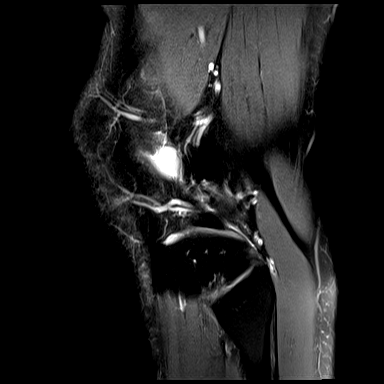
[im 28/28]
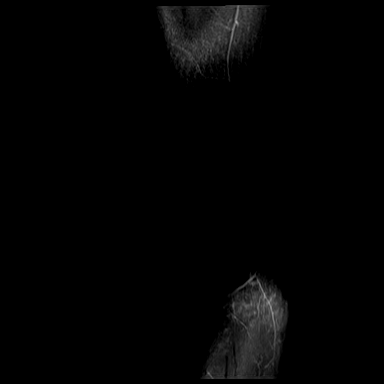

[Series 12: T2 fat-sat · sagittal · right · 3.0mm · 0.39mm/px · 7 of 28 slices shown (3 of 3)]
[im 1/28]
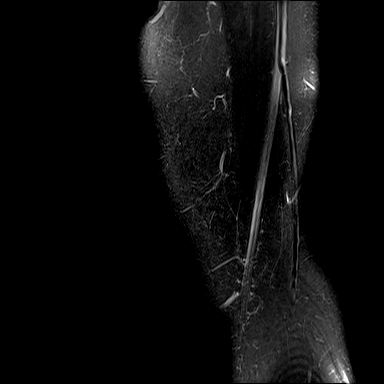
[im 5/28]
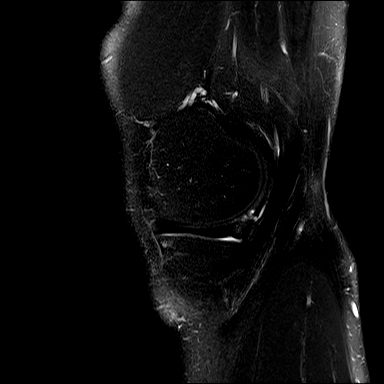
[im 10/28]
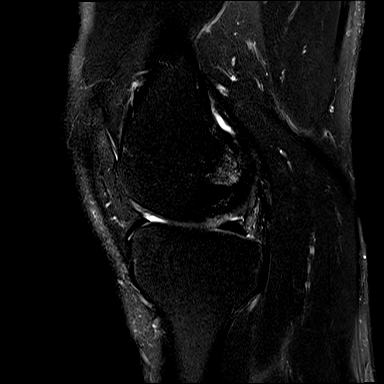
[im 14/28]
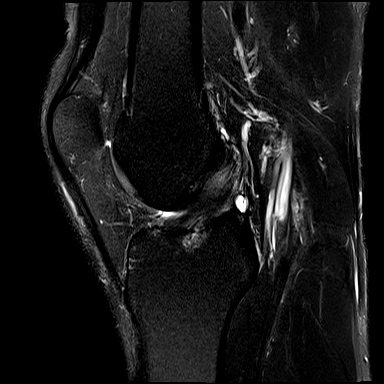
[im 19/28]
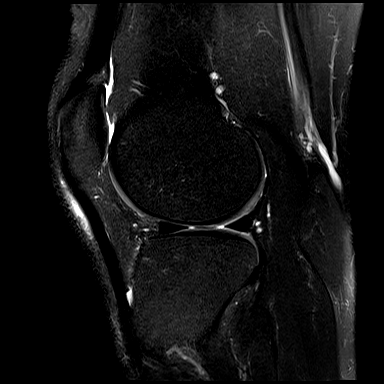
[im 23/28]
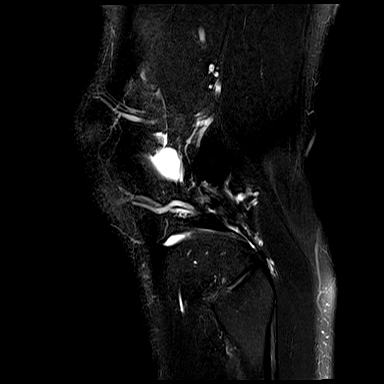
[im 28/28]
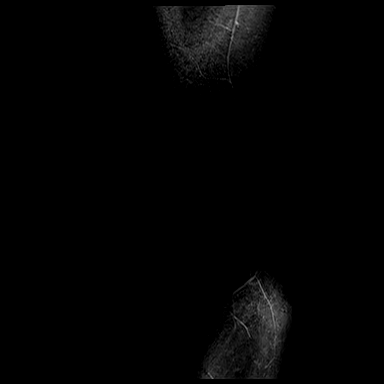

[38 of 40 positions shown; findings below may reference images not displayed]

FINDINGS: MENISCI

Medial meniscus:  Normal.

Lateral meniscus:  Normal.

LIGAMENTS

Cruciates: PCL is normal. There is diffuse progressive mucoid
degeneration of the anterior cruciate ligament with slight
degenerative changes in the femur and tibia at the ACL origin and
insertion respectively.

Collaterals:  Normal.

CARTILAGE

Patellofemoral: Normal. Again noted is a shallow trochlear groove
with slight lateral subluxation of the patella, unchanged.

Medial:  Normal.

Lateral:  Normal.

Joint: Minimal effusion, essentially unchanged. Hoffa's fat pad is
normal. No plical thickening.

Popliteal Fossa: Tiny Baker's cyst, essentially unchanged. Popliteus
tendon is normal.

Extensor Mechanism:  Normal.

Bones:  No significant abnormality.

Other: None
IMPRESSION: 1. Progressive mucoid degeneration of the anterior cruciate
ligament.
2. Minimal joint effusion, chronic.
3. Chronic slight lateral subluxation of the patella with trochlear
dysplasia.

## 2020-05-02 ENCOUNTER — Other Ambulatory Visit: Payer: Self-pay

## 2020-05-02 ENCOUNTER — Encounter: Payer: Self-pay | Admitting: Family Medicine

## 2020-05-02 ENCOUNTER — Ambulatory Visit: Payer: 59 | Admitting: Family Medicine

## 2020-05-02 VITALS — BP 156/100 | HR 105 | Ht 67.0 in | Wt 173.0 lb

## 2020-05-02 DIAGNOSIS — I10 Essential (primary) hypertension: Secondary | ICD-10-CM | POA: Diagnosis not present

## 2020-05-02 DIAGNOSIS — F900 Attention-deficit hyperactivity disorder, predominantly inattentive type: Secondary | ICD-10-CM

## 2020-05-02 LAB — COMPREHENSIVE METABOLIC PANEL
ALT: 27 U/L (ref 0–53)
AST: 21 U/L (ref 0–37)
Albumin: 4.6 g/dL (ref 3.5–5.2)
Alkaline Phosphatase: 56 U/L (ref 39–117)
BUN: 7 mg/dL (ref 6–23)
CO2: 30 mEq/L (ref 19–32)
Calcium: 9.8 mg/dL (ref 8.4–10.5)
Chloride: 99 mEq/L (ref 96–112)
Creatinine, Ser: 1.03 mg/dL (ref 0.40–1.50)
GFR: 79.62 mL/min (ref >60.00)
Glucose, Bld: 97 mg/dL (ref 70–99)
Potassium: 4.5 mEq/L (ref 3.5–5.1)
Sodium: 136 mEq/L (ref 135–145)
Total Bilirubin: 0.6 mg/dL (ref 0.2–1.2)
Total Protein: 7.5 g/dL (ref 6.0–8.3)

## 2020-05-02 LAB — LIPID PANEL
Cholesterol: 200 mg/dL (ref 0–200)
HDL: 59.6 mg/dL (ref >39.00)
LDL Cholesterol: 117 mg/dL — ABNORMAL HIGH (ref 0–99)
NonHDL: 140.49
Total CHOL/HDL Ratio: 3
Triglycerides: 118 mg/dL (ref 0.0–149.0)
VLDL: 23.6 mg/dL (ref 0.0–40.0)

## 2020-05-02 LAB — TSH: TSH: 2.21 u[IU]/mL (ref 0.35–4.50)

## 2020-05-02 MED ORDER — BUPROPION HCL ER (XL) 150 MG PO TB24: ORAL_TABLET | ORAL | 1 refills | Status: DC

## 2020-05-02 MED ORDER — LISINOPRIL 10 MG PO TABS: 10.0000 mg | ORAL_TABLET | Freq: Every day | ORAL | 3 refills | Status: DC

## 2020-05-02 MED ORDER — AMPHETAMINE-DEXTROAMPHET ER 20 MG PO CP24: 20.0000 mg | ORAL_CAPSULE | ORAL | 0 refills | Status: DC

## 2020-05-02 NOTE — Assessment & Plan Note (Addendum)
Uncontrolled. Start lisinopril. Blood work today. Encouraged limiting alcohol and following DASH diet

## 2020-05-02 NOTE — Patient Instructions (Signed)
#  ADHD - start wellbutrin daily - decrease the Adderall to 20 mg - if you want to decrease the adderall further reach out   Start lisinopril 10 mg daily If after 2 weeks blood pressure is still >150/100 increase to 20 mg    Your blood pressure high.   High blood pressure increases your risk for heart attack and stroke.    Please check your blood pressure 2-4 times a week.   To check your blood pressure 1) Sit in a quiet and relaxed place for 5 minutes 2) Make sure your feet are flat on the ground 3) Consider checking first thing in the morning   Normal blood pressure is less than 140/90 Ideally you blood pressure should be around 120/80  Other ways you can reduce your blood pressure:  1) Regular exercise -- Try to get 150 minutes (30 minutes, 5 days a week) of moderate to vigorous aerobic excercise -- Examples: brisk walking (2.5 miles per hour), water aerobics, dancing, gardening, tennis, biking slower than 10 miles per hour 2) DASH Diet - low fat meats, more fresh fruits and vegetables, whole grains, low salt 3) Quit smoking if you smoke 4) Loose 5-10% of your body weight

## 2020-05-02 NOTE — Assessment & Plan Note (Signed)
Stable symptoms on lower dose of adderall. Will decrease adderall while starting wellbutrin. Discussed that both can impact BP will continue to monitor.

## 2020-05-02 NOTE — Progress Notes (Signed)
   Subjective:     Bradley Schaefer is a 39 y.o. male presenting for ADHD and Hypertension     HPI  #ADHD - doing well on the lower dose of the adderall - adhd is well managed on 25 mg - always assumed that he would need to stop the medicine - was on Strattera when younger - but caused nausea  #HTN - has been getting elevated numbers at home - no cp, breathing difficulty  Review of Systems   Social History   Tobacco Use  Smoking Status Never Smoker  Smokeless Tobacco Never Used        Objective:    BP Readings from Last 3 Encounters:  05/02/20 (!) 156/100  03/27/20 (!) 136/92  03/19/20 (!) 142/88   Wt Readings from Last 3 Encounters:  05/02/20 173 lb (78.5 kg)  03/27/20 173 lb (78.5 kg)  03/19/20 175 lb 4 oz (79.5 kg)    BP (!) 156/100   Pulse (!) 105   Ht 5\' 7"  (1.702 m)   Wt 173 lb (78.5 kg)   SpO2 98%   BMI 27.10 kg/m    Physical Exam Constitutional:      Appearance: Normal appearance. He is not ill-appearing or diaphoretic.  HENT:     Right Ear: External ear normal.     Left Ear: External ear normal.     Nose: Nose normal.  Eyes:     General: No scleral icterus.    Extraocular Movements: Extraocular movements intact.     Conjunctiva/sclera: Conjunctivae normal.  Cardiovascular:     Rate and Rhythm: Normal rate and regular rhythm.     Heart sounds: No murmur heard.   Pulmonary:     Effort: Pulmonary effort is normal. No respiratory distress.     Breath sounds: Normal breath sounds. No wheezing.  Musculoskeletal:     Cervical back: Neck supple.  Skin:    General: Skin is warm and dry.  Neurological:     Mental Status: He is alert. Mental status is at baseline.  Psychiatric:        Mood and Affect: Mood normal.           Assessment & Plan:   Problem List Items Addressed This Visit      Cardiovascular and Mediastinum   Essential hypertension    Uncontrolled. Start lisinopril.       Relevant Medications   lisinopril (ZESTRIL)  10 MG tablet   Other Relevant Orders   Comprehensive metabolic panel   Lipid panel   TSH     Other   Attention deficit hyperactivity disorder (ADHD) - Primary    Stable symptoms on lower dose of adderall. Will decrease adderall while starting wellbutrin. Discussed that both can impact BP will continue to monitor.       Relevant Medications   amphetamine-dextroamphetamine (ADDERALL XR) 20 MG 24 hr capsule   buPROPion (WELLBUTRIN XL) 150 MG 24 hr tablet       Return in about 4 weeks (around 05/30/2020).  Lesleigh Noe, MD  This visit occurred during the SARS-CoV-2 public health emergency.  Safety protocols were in place, including screening questions prior to the visit, additional usage of staff PPE, and extensive cleaning of exam room while observing appropriate contact time as indicated for disinfecting solutions.

## 2020-05-07 ENCOUNTER — Other Ambulatory Visit: Payer: Self-pay | Admitting: *Deleted

## 2020-05-07 DIAGNOSIS — I83812 Varicose veins of left lower extremities with pain: Secondary | ICD-10-CM

## 2020-05-09 ENCOUNTER — Encounter: Payer: Self-pay | Admitting: Family Medicine

## 2020-05-09 MED ORDER — LOSARTAN POTASSIUM 50 MG PO TABS: 50.0000 mg | ORAL_TABLET | Freq: Every day | ORAL | 3 refills | Status: DC

## 2020-05-10 ENCOUNTER — Other Ambulatory Visit: Payer: Self-pay

## 2020-05-10 MED ORDER — ALBUTEROL SULFATE HFA 108 (90 BASE) MCG/ACT IN AERS: 2.0000 | INHALATION_SPRAY | Freq: Four times a day (QID) | RESPIRATORY_TRACT | 2 refills | Status: DC | PRN

## 2020-05-10 MED FILL — ALBUTEROL SULFATE HFA 108 (: 108 (90 BAS | 25 days supply | Qty: 18 | Fill #0

## 2020-06-05 ENCOUNTER — Encounter: Payer: Self-pay | Admitting: Vascular Surgery

## 2020-06-19 ENCOUNTER — Encounter: Payer: Self-pay | Admitting: Vascular Surgery

## 2020-06-19 ENCOUNTER — Ambulatory Visit (INDEPENDENT_AMBULATORY_CARE_PROVIDER_SITE_OTHER): Payer: 59 | Admitting: Vascular Surgery

## 2020-06-19 ENCOUNTER — Other Ambulatory Visit: Payer: Self-pay

## 2020-06-19 VITALS — BP 146/97 | HR 90 | Temp 98.1°F | Resp 18 | Ht 67.0 in | Wt 172.0 lb

## 2020-06-19 DIAGNOSIS — I83812 Varicose veins of left lower extremities with pain: Secondary | ICD-10-CM | POA: Diagnosis not present

## 2020-06-19 HISTORY — PX: ENDOVENOUS ABLATION SAPHENOUS VEIN W/ LASER: SUR449

## 2020-06-19 NOTE — Progress Notes (Signed)
     Laser Ablation Procedure    Date: 06/19/2020   Bradley Schaefer DOB:10/13/1981  Consent signed: Yes    Surgeon:Susanna Benge MD    Procedure: Laser Ablation: left Greater Saphenous Vein  BP (!) 146/97 (BP Location: Left Arm, Patient Position: Sitting, Cuff Size: Normal)   Pulse 90   Temp 98.1 F (36.7 C) (Temporal)   Resp 18   Ht 5\' 7"  (1.702 m)   Wt 172 lb (78 kg)   SpO2 98%   BMI 26.94 kg/m   Tumescent Anesthesia: 700 cc 0.9% NaCl with 50 cc Lidocaine HCL 1%  and 15 cc 8.4% NaHCO3  Local Anesthesia: 25 cc Lidocaine HCL and NaHCO3 (ratio 2:1)  7 watts continuous mode     Total energy: 1557 Joules    Total time: 222 seconds Treatment Length  31 cm   Laser Fiber Ref.# [id number]    Lot # [id number]   Stab Phlebectomy: >20 Sites: Thigh and Calf  Patient tolerated procedure well  Notes: Patient wore face mask.  All staff members wore facial masks and facial shields/goggles.    Description of Procedure:  After marking the course of the secondary varicosities, the patient was placed on the operating table in the supine position, and the left leg was prepped and draped in sterile fashion.   Local anesthetic was administered and under ultrasound guidance the saphenous vein was accessed with a micro needle and guide wire; then the mirco puncture sheath was placed.  A guide wire was inserted saphenofemoral junction , followed by a 5 french sheath.  The position of the sheath and then the laser fiber below the junction was confirmed using the ultrasound.  Tumescent anesthesia was administered along the course of the saphenous vein using ultrasound guidance. The patient was placed in Trendelenburg position and protective laser glasses were placed on patient and staff, and the laser was fired at 7 watts continuous mode for a total of 1557 joules.   For stab phlebectomies, local anesthetic was administered at the previously marked varicosities, and tumescent anesthesia was administered  around the vessels.  Greater than 20 stab wounds were made using the tip of an 11 blade. And using the vein hook, the phlebectomies were performed using a hemostat to avulse the varicosities.  Adequate hemostasis was achieved.     Steri strips were applied to the stab wounds and ABD pads and thigh high compression stockings were applied.  Ace wrap bandages were applied over the phlebectomy sites and at the top of the saphenofemoral junction. Blood loss was less than 15 cc.  Discharge instructions reviewed with patient and hardcopy of discharge instructions given to patient to take home. The patient ambulated out of the operating room having tolerated the procedure well.  [name], MD [facility] Office: [phone]

## 2020-06-19 NOTE — Progress Notes (Signed)
Opened in error

## 2020-06-27 ENCOUNTER — Telehealth (INDEPENDENT_AMBULATORY_CARE_PROVIDER_SITE_OTHER): Payer: 59 | Admitting: Family Medicine

## 2020-06-27 ENCOUNTER — Other Ambulatory Visit: Payer: Self-pay | Admitting: Family Medicine

## 2020-06-27 ENCOUNTER — Other Ambulatory Visit: Payer: Self-pay

## 2020-06-27 VITALS — BP 127/80 | HR 79 | Ht 67.0 in | Wt 172.0 lb

## 2020-06-27 DIAGNOSIS — J4540 Moderate persistent asthma, uncomplicated: Secondary | ICD-10-CM

## 2020-06-27 DIAGNOSIS — I10 Essential (primary) hypertension: Secondary | ICD-10-CM

## 2020-06-27 DIAGNOSIS — F900 Attention-deficit hyperactivity disorder, predominantly inattentive type: Secondary | ICD-10-CM | POA: Diagnosis not present

## 2020-06-27 MED ORDER — FLUTICASONE-SALMETEROL 100-50 MCG/DOSE IN AEPB
1.0000 | INHALATION_SPRAY | Freq: Two times a day (BID) | RESPIRATORY_TRACT | 0 refills | Status: DC
  Filled 2021-02-14: qty 60, 30d supply, fill #0

## 2020-06-27 MED ORDER — BUPROPION HCL ER (XL) 300 MG PO TB24: 300.0000 mg | ORAL_TABLET | Freq: Every day | ORAL | 3 refills | Status: DC

## 2020-06-27 MED ORDER — LOSARTAN POTASSIUM 50 MG PO TABS: 50.0000 mg | ORAL_TABLET | Freq: Two times a day (BID) | ORAL | 3 refills | Status: DC

## 2020-06-27 NOTE — Assessment & Plan Note (Signed)
Symptoms improved and anxiety improved on wellbutrin. Continue 300 mg daily dose. Return yearly.

## 2020-06-27 NOTE — Assessment & Plan Note (Signed)
Symptoms worse over the last month with daily use of albuterol for wheezing. Start Advair daily. Start allegra daily. Return in 1 month if not noticing significant improvement.

## 2020-06-27 NOTE — Assessment & Plan Note (Signed)
BP improved. Pt prefers twice daily losartan dosing. Continue losartan 50 mg BID. Return for BMP.

## 2020-06-27 NOTE — Progress Notes (Signed)
I connected with Bradley Schaefer on 06/27/20 at  9:20 AM EDT by video and verified that I am speaking with the correct person using two identifiers.   I discussed the limitations, risks, security and privacy concerns of performing an evaluation and management service by video and the availability of in person appointments. I also discussed with the patient that there may be a patient responsible charge related to this service. The patient expressed understanding and agreed to proceed.  Patient location: Home Provider Location: [place] Participants: Lesleigh Noe and Meko Masterson   Subjective:     Bradley Schaefer is a 39 y.o. male presenting for Follow-up (on meds losartan/wellbutrin and being off adderall)     HPI  #HTN - doing well - taking the losartan twice daily  #ADHD - wellbutrin is improving  - symptoms of anxiety are improved - when he started the 300 mg dose - focus is still good  #Masks/asthma - has been using inhaler more often - 3-4 times a day - symptoms improve w/ inhaler use - planning to start daily allegra  - already using flonase - cough for lisinopril did resolve - had a cold and was tested for covid which was negative -   Review of Systems  05/02/2020: Clinic - ADHD - switch to wellbutrin. HTN - start lisinopril 05/09/2020: Mychart - cough with lisinopril - switch to losartan  Social History   Tobacco Use  Smoking Status Never Smoker  Smokeless Tobacco Never Used        Objective:   BP Readings from Last 3 Encounters:  06/27/20 127/80  06/19/20 (!) 146/97  05/02/20 (!) 156/100   Wt Readings from Last 3 Encounters:  06/27/20 172 lb (78 kg)  06/19/20 172 lb (78 kg)  05/02/20 173 lb (78.5 kg)    BP 127/80   Pulse 79   Ht 5\' 7"  (1.702 m)   Wt 172 lb (78 kg)   BMI 26.94 kg/m    Physical Exam Constitutional:      Appearance: Normal appearance. He is not ill-appearing.  HENT:     Head: Normocephalic and atraumatic.      Right Ear: External ear normal.     Left Ear: External ear normal.  Eyes:     Conjunctiva/sclera: Conjunctivae normal.  Pulmonary:     Effort: Pulmonary effort is normal. No respiratory distress.  Neurological:     Mental Status: He is alert. Mental status is at baseline.  Psychiatric:        Mood and Affect: Mood normal.        Behavior: Behavior normal.        Thought Content: Thought content normal.        Judgment: Judgment normal.            Assessment & Plan:   Problem List Items Addressed This Visit      Cardiovascular and Mediastinum   Essential hypertension    BP improved. Pt prefers twice daily losartan dosing. Continue losartan 50 mg BID. Return for BMP.       Relevant Medications   losartan (COZAAR) 50 MG tablet   Other Relevant Orders   Basic metabolic panel     Respiratory   Asthma    Symptoms worse over the last month with daily use of albuterol for wheezing. Start Advair daily. Start allegra daily. Return in 1 month if not noticing significant improvement.       Relevant Medications   Fluticasone-Salmeterol (  ADVAIR) 100-50 MCG/DOSE AEPB     Other   Attention deficit hyperactivity disorder (ADHD) - Primary    Symptoms improved and anxiety improved on wellbutrin. Continue 300 mg daily dose. Return yearly.       Relevant Medications   buPROPion (WELLBUTRIN XL) 300 MG 24 hr tablet       Return in about 4 weeks (around 07/25/2020).  Lesleigh Noe, MD

## 2020-07-03 ENCOUNTER — Ambulatory Visit: Payer: 59 | Admitting: Vascular Surgery

## 2020-07-03 ENCOUNTER — Other Ambulatory Visit: Payer: Self-pay

## 2020-07-03 ENCOUNTER — Ambulatory Visit (HOSPITAL_COMMUNITY)
Admission: RE | Admit: 2020-07-03 | Discharge: 2020-07-03 | Disposition: A | Discharge status: Home / Self Care | Payer: 59 | Source: Ambulatory Visit | Attending: Vascular Surgery | Admitting: Vascular Surgery

## 2020-07-03 ENCOUNTER — Encounter: Payer: Self-pay | Admitting: Vascular Surgery

## 2020-07-03 VITALS — BP 138/98 | HR 84 | Temp 97.4°F | Resp 16 | Ht 67.0 in | Wt 172.0 lb

## 2020-07-03 DIAGNOSIS — I83812 Varicose veins of left lower extremities with pain: Secondary | ICD-10-CM

## 2020-07-03 NOTE — Progress Notes (Signed)
Patient is a 39 year old male who returns for follow-up today.  He underwent laser ablation and stab avulsions June 19, 2020.  He still has some bruising and a little bit of swelling near his left medial thigh but otherwise is doing well.  Physical exam:  Vitals:   07/03/20 1024  BP: (!) 138/98  Pulse: 84  Resp: 16  Temp: (!) 97.4 F (36.3 C)  TempSrc: Temporal  SpO2: 98%  Weight: 172 lb (78 kg)  Height: 5\' 7"  (1.702 m)    Mild periincisional bruising left leg no significant edema  Data: Patient had a duplex ultrasound today which shows successful closure of left greater saphenous vein within 1.5 cm of the saphenofemoral junction no evidence of DVT  Assessment: Successful laser ablation stab avulsions left leg  Patient was encouraged to continue to wear his compression stockings and he will follow up on an as-needed basis.  [name], MD [facility] Office: [phone]

## 2020-07-10 ENCOUNTER — Encounter: Payer: Self-pay | Admitting: Family Medicine

## 2020-07-10 DIAGNOSIS — J4540 Moderate persistent asthma, uncomplicated: Secondary | ICD-10-CM

## 2020-07-10 MED ORDER — PREDNISONE 20 MG PO TABS: 40.0000 mg | ORAL_TABLET | Freq: Every day | ORAL | 0 refills | Status: AC

## 2020-08-13 ENCOUNTER — Other Ambulatory Visit: Payer: Self-pay | Admitting: Internal Medicine

## 2020-08-13 ENCOUNTER — Ambulatory Visit: Payer: 59 | Attending: Internal Medicine

## 2020-08-13 DIAGNOSIS — Z23 Encounter for immunization: Secondary | ICD-10-CM

## 2020-08-13 NOTE — Progress Notes (Signed)
   Covid-19 Vaccination Clinic  Name:  Baraka Klatt    MRN: [id number] DOB: Jul 19, 1981  08/13/2020  Mr. [place] was observed post Covid-19 immunization for 15 minutes without incident. He was provided with Vaccine Information Sheet and instruction to access the V-Safe system.   Mr. Troia was instructed to call [phone] with any severe reactions post vaccine: Marland Kitchen Difficulty breathing  . Swelling of face and throat  . A fast heartbeat  . A bad rash all over body  . Dizziness and weakness

## 2020-11-02 DIAGNOSIS — G4730 Sleep apnea, unspecified: Secondary | ICD-10-CM

## 2020-11-02 HISTORY — DX: Sleep apnea, unspecified: G47.30

## 2021-01-23 DIAGNOSIS — H5213 Myopia, bilateral: Secondary | ICD-10-CM | POA: Diagnosis not present

## 2021-02-14 ENCOUNTER — Other Ambulatory Visit: Payer: Self-pay

## 2021-03-24 ENCOUNTER — Encounter: Payer: Self-pay | Admitting: Family Medicine

## 2021-03-28 ENCOUNTER — Other Ambulatory Visit: Payer: Self-pay

## 2021-03-28 DIAGNOSIS — K219 Gastro-esophageal reflux disease without esophagitis: Secondary | ICD-10-CM

## 2021-03-28 MED ORDER — ESOMEPRAZOLE MAGNESIUM 40 MG PO CPDR
40.0000 mg | DELAYED_RELEASE_CAPSULE | Freq: Every day | ORAL | 2 refills | Status: DC
  Filled 2021-03-28: qty 90, 90d supply, fill #0
  Filled 2021-06-24: qty 90, 90d supply, fill #1
  Filled 2021-09-16: qty 90, 90d supply, fill #2

## 2021-04-04 ENCOUNTER — Other Ambulatory Visit (HOSPITAL_COMMUNITY): Payer: Self-pay

## 2021-04-09 ENCOUNTER — Other Ambulatory Visit (HOSPITAL_COMMUNITY): Payer: Self-pay

## 2021-05-01 ENCOUNTER — Other Ambulatory Visit (HOSPITAL_COMMUNITY): Payer: Self-pay

## 2021-05-16 ENCOUNTER — Other Ambulatory Visit: Payer: Self-pay

## 2021-05-16 MED FILL — Losartan Potassium Tab 50 MG: ORAL | 90 days supply | Qty: 180 | Fill #0 | Status: CN

## 2021-05-27 ENCOUNTER — Other Ambulatory Visit: Payer: Self-pay

## 2021-05-30 ENCOUNTER — Other Ambulatory Visit: Payer: Self-pay

## 2021-05-30 MED FILL — Losartan Potassium Tab 50 MG: ORAL | 90 days supply | Qty: 180 | Fill #0 | Status: AC

## 2021-06-24 ENCOUNTER — Other Ambulatory Visit: Payer: Self-pay

## 2021-07-01 ENCOUNTER — Other Ambulatory Visit (HOSPITAL_COMMUNITY): Payer: Self-pay

## 2021-07-22 ENCOUNTER — Ambulatory Visit: Payer: 59 | Admitting: Family Medicine

## 2021-07-24 ENCOUNTER — Other Ambulatory Visit: Payer: Self-pay

## 2021-07-24 ENCOUNTER — Ambulatory Visit: Payer: 59 | Admitting: Family Medicine

## 2021-07-24 VITALS — BP 124/88 | HR 86 | Temp 97.0°F | Ht 66.5 in | Wt 186.2 lb

## 2021-07-24 DIAGNOSIS — G8929 Other chronic pain: Secondary | ICD-10-CM

## 2021-07-24 DIAGNOSIS — M25512 Pain in left shoulder: Secondary | ICD-10-CM

## 2021-07-24 DIAGNOSIS — K219 Gastro-esophageal reflux disease without esophagitis: Secondary | ICD-10-CM

## 2021-07-24 DIAGNOSIS — I10 Essential (primary) hypertension: Secondary | ICD-10-CM

## 2021-07-24 DIAGNOSIS — R197 Diarrhea, unspecified: Secondary | ICD-10-CM | POA: Diagnosis not present

## 2021-07-24 DIAGNOSIS — Z1159 Encounter for screening for other viral diseases: Secondary | ICD-10-CM | POA: Diagnosis not present

## 2021-07-24 DIAGNOSIS — A6002 Herpesviral infection of other male genital organs: Secondary | ICD-10-CM | POA: Diagnosis not present

## 2021-07-24 LAB — CBC WITH DIFFERENTIAL/PLATELET
Basophils Absolute: 0 10*3/uL (ref 0.0–0.1)
Basophils Relative: 0.2 % (ref 0.0–3.0)
Eosinophils Absolute: 0.1 10*3/uL (ref 0.0–0.7)
Eosinophils Relative: 0.7 % (ref 0.0–5.0)
HCT: 48.2 % (ref 39.0–52.0)
Hemoglobin: 16.8 g/dL (ref 13.0–17.0)
Lymphocytes Relative: 31.7 % (ref 12.0–46.0)
Lymphs Abs: 2.8 10*3/uL (ref 0.7–4.0)
MCHC: 34.8 g/dL (ref 30.0–36.0)
MCV: 87.9 fl (ref 78.0–100.0)
Monocytes Absolute: 0.9 10*3/uL (ref 0.1–1.0)
Monocytes Relative: 9.9 % (ref 3.0–12.0)
Neutro Abs: 5 10*3/uL (ref 1.4–7.7)
Neutrophils Relative %: 57.5 % (ref 43.0–77.0)
Platelets: 283 10*3/uL (ref 150.0–400.0)
RBC: 5.48 Mil/uL (ref 4.22–5.81)
RDW: 13.6 % (ref 11.5–15.5)
WBC: 8.8 10*3/uL (ref 4.0–10.5)

## 2021-07-24 LAB — COMPREHENSIVE METABOLIC PANEL
ALT: 18 U/L (ref 0–53)
AST: 19 U/L (ref 0–37)
Albumin: 4.5 g/dL (ref 3.5–5.2)
Alkaline Phosphatase: 57 U/L (ref 39–117)
BUN: 9 mg/dL (ref 6–23)
CO2: 24 mEq/L (ref 19–32)
Calcium: 9.7 mg/dL (ref 8.4–10.5)
Chloride: 104 mEq/L (ref 96–112)
Creatinine, Ser: 0.95 mg/dL (ref 0.40–1.50)
GFR: 98.99 mL/min (ref >60.00)
Glucose, Bld: 86 mg/dL (ref 70–99)
Potassium: 4 mEq/L (ref 3.5–5.1)
Sodium: 138 mEq/L (ref 135–145)
Total Bilirubin: 0.7 mg/dL (ref 0.2–1.2)
Total Protein: 7.5 g/dL (ref 6.0–8.3)

## 2021-07-24 LAB — TSH: TSH: 2.36 u[IU]/mL (ref 0.35–5.50)

## 2021-07-24 LAB — T4, FREE: Free T4: 0.99 ng/dL (ref 0.60–1.60)

## 2021-07-24 MED ORDER — VALACYCLOVIR HCL 1 G PO TABS
1000.0000 mg | ORAL_TABLET | Freq: Every day | ORAL | 0 refills | Status: AC
  Filled 2021-07-24: qty 90, 90d supply, fill #0

## 2021-07-24 MED ORDER — TIZANIDINE HCL 4 MG PO CAPS
4.0000 mg | ORAL_CAPSULE | Freq: Three times a day (TID) | ORAL | 0 refills | Status: DC | PRN
  Filled 2021-07-24: qty 90, 30d supply, fill #0

## 2021-07-24 NOTE — Assessment & Plan Note (Signed)
Stable. Using valtrex prn for outbreaks - he is not sure how many outbreaks or when his last one was. Will continue to monitor and change back to suppression if frequency worsens.

## 2021-07-24 NOTE — Assessment & Plan Note (Signed)
Controlled. Cont losartan 50 mg.

## 2021-07-24 NOTE — Assessment & Plan Note (Signed)
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 Prn tizanidine is effective. Refill provided. Not currently interested in surgery

## 2021-07-24 NOTE — Assessment & Plan Note (Signed)
For 2 years and worse for the last 6 months. FmHx of Crohn's in mother. Will get stool studies and lab work up including r/o celiac disease. Referral to GI given duration and concern that he is higher risk. Will update once labs have returned.

## 2021-07-24 NOTE — Assessment & Plan Note (Signed)
Cont nexium 40 mg. Was able to stop for a few months which is reassuring. Encouraged holidays as tolerated

## 2021-07-24 NOTE — Patient Instructions (Signed)
#  Referral I have placed a referral to a specialist for you. You should receive a phone call from the specialty office. Make sure your voicemail is not full and that if you are able to answer your phone to unknown or new numbers.   It may take up to 2 weeks to hear about the referral. If you do not hear anything in 2 weeks, please call our office and ask to speak with the referral coordinator.   

## 2021-07-24 NOTE — Progress Notes (Signed)
Subjective:     Bradley Schaefer is a 40 y.o. male presenting for Annual Exam, Referral (GI - location doesn't matter ), and Diarrhea (BM's never solid. Mom has crohns colitis )     HPI  #Diarrhea - mom with crohn's disease - no hx of work-up - got Covid in march 2022 - has had worsening symptoms since then  - prior to this did still have some stools that appear bloody - no fatigue or feeling run down - blood tinged once per day - having 2-3 bm daily - which are loose - does have a frank watery stool once per week - no joint pain - no weight loss - has gained - no diet association - has tried to narrow it down  - did have stomach pain after fried - does occasionally have stomach - worse since March 2022 but symptoms for a few years  #Herpes - switched to outbreak treatment - genital herpes - not sure when his last outbreak  #Left shoulder pain - hx of injury - recurrent dislocation - saw Dr. Lorelei Pont a few years ago - told surgery could help - will get muscle tension/spasms and will take tizanidine as need  #GERD - on nexium - tried stopping and was doing well with probiotic - symptoms came back after a few months  Review of Systems   Social History   Tobacco Use  Smoking Status Never  Smokeless Tobacco Never        Objective:    BP Readings from Last 3 Encounters:  07/24/21 124/88  07/03/20 (!) 138/98  06/27/20 127/80   Wt Readings from Last 3 Encounters:  07/24/21 186 lb 4 oz (84.5 kg)  07/03/20 172 lb (78 kg)  06/27/20 172 lb (78 kg)    BP 124/88   Pulse 86   Temp (!) 97 F (36.1 C) (Temporal)   Ht 5' 6.5" (1.689 m)   Wt 186 lb 4 oz (84.5 kg)   SpO2 99%   BMI 29.61 kg/m    Physical Exam Constitutional:      Appearance: Normal appearance. He is not ill-appearing or diaphoretic.  HENT:     Right Ear: External ear normal.     Left Ear: External ear normal.     Nose: Nose normal.  Eyes:     General: No scleral icterus.     Extraocular Movements: Extraocular movements intact.     Conjunctiva/sclera: Conjunctivae normal.  Cardiovascular:     Rate and Rhythm: Normal rate and regular rhythm.     Heart sounds: No murmur heard. Pulmonary:     Effort: Pulmonary effort is normal. No respiratory distress.     Breath sounds: Normal breath sounds. No wheezing.  Abdominal:     General: There is no distension.     Palpations: Abdomen is soft.     Tenderness: There is no abdominal tenderness. There is no guarding or rebound.  Musculoskeletal:     Cervical back: Neck supple.  Skin:    General: Skin is warm and dry.  Neurological:     Mental Status: He is alert. Mental status is at baseline.  Psychiatric:        Mood and Affect: Mood normal.          Assessment & Plan:   Problem List Items Addressed This Visit       Cardiovascular and Mediastinum   Essential hypertension    Controlled. Cont losartan 50 mg.  Digestive   Gastroesophageal reflux disease    Cont nexium 40 mg. Was able to stop for a few months which is reassuring. Encouraged holidays as tolerated      Bloody diarrhea - Primary    For 2 years and worse for the last 6 months. FmHx of Crohn's in mother. Will get stool studies and lab work up including r/o celiac disease. Referral to GI given duration and concern that he is higher risk. Will update once labs have returned.       Relevant Orders   Fecal occult blood, imunochemical   Celiac Pnl 2 rflx Endomysial Ab Ttr   CBC with Differential/Platelet   TSH   Comprehensive metabolic panel   T4, free   Ambulatory referral to Gastroenterology   Ova and parasite examination   Calprotectin, Fecal   Giardia/cryptosporidium (EIA)     Genitourinary   Herpes genitalis in men    Stable. Using valtrex prn for outbreaks - he is not sure how many outbreaks or when his last one was. Will continue to monitor and change back to suppression if frequency worsens.       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 Prn tizanidine is effective. Refill provided. Not currently interested in surgery      Relevant Medications   tiZANidine (ZANAFLEX) 4 MG capsule   Other Visit Diagnoses     Need for hepatitis C screening test       Relevant Orders   Hepatitis C antibody        Return in about 1 year (around 07/24/2022).  Lesleigh Noe, MD  This visit occurred during the SARS-CoV-2 public health emergency.  Safety protocols were in place, including screening questions prior to the visit, additional usage of staff PPE, and extensive cleaning of exam room while observing appropriate contact time as indicated for disinfecting solutions.

## 2021-07-25 ENCOUNTER — Other Ambulatory Visit: Payer: Self-pay

## 2021-07-25 ENCOUNTER — Other Ambulatory Visit (INDEPENDENT_AMBULATORY_CARE_PROVIDER_SITE_OTHER): Payer: 59

## 2021-07-25 DIAGNOSIS — R197 Diarrhea, unspecified: Secondary | ICD-10-CM | POA: Diagnosis not present

## 2021-07-25 LAB — FECAL OCCULT BLOOD, IMMUNOCHEMICAL: Fecal Occult Bld: POSITIVE — AB

## 2021-07-25 NOTE — Addendum Note (Signed)
Addended by: Leeanne Rio on: 07/25/2021 12:38 PM   Modules accepted: Orders

## 2021-07-28 ENCOUNTER — Telehealth: Payer: Self-pay | Admitting: Radiology

## 2021-07-28 NOTE — Telephone Encounter (Signed)
Noted, pt notified via mychart.   Already referred to GI due to symptoms and hx

## 2021-07-28 NOTE — Telephone Encounter (Signed)
[facility] called a POSITIVE ifob, results called to Dr Einar Pheasant and sent to Dr Einar Pheasant

## 2021-07-29 ENCOUNTER — Encounter: Payer: Self-pay | Admitting: Internal Medicine

## 2021-07-29 LAB — CALPROTECTIN, FECAL: Calprotectin, Fecal: 25 ug/g (ref 0–120)

## 2021-07-30 LAB — GIARDIA AND CRYPTOSPORIDIUM ANTIGEN PANEL
MICRO NUMBER:: 12415743
RESULT:: NOT DETECTED
SPECIMEN QUALITY:: ADEQUATE
Specimen Quality:: ADEQUATE
micro Number:: 12415742

## 2021-07-30 LAB — OVA AND PARASITE EXAMINATION
CONCENTRATE RESULT:: NONE SEEN
MICRO NUMBER:: 12415744
SPECIMEN QUALITY:: ADEQUATE
TRICHROME RESULT:: NONE SEEN

## 2021-07-31 LAB — CELIAC PNL 2 RFLX ENDOMYSIAL AB TTR
(tTG) Ab, IgA: <1 U/mL
(tTG) Ab, IgG: <1 U/mL
Endomysial Ab IgA: NEGATIVE
Gliadin IgA: <1 U/mL
Gliadin IgG: <1 U/mL
Immunoglobulin A: 207 mg/dL (ref 47–310)

## 2021-07-31 LAB — HEPATITIS C ANTIBODY
Hepatitis C Ab: NONREACTIVE
SIGNAL TO CUT-OFF: 0.01 (ref <1.00)

## 2021-08-04 ENCOUNTER — Other Ambulatory Visit (HOSPITAL_COMMUNITY): Payer: Self-pay

## 2021-08-07 ENCOUNTER — Other Ambulatory Visit (HOSPITAL_COMMUNITY): Payer: Self-pay

## 2021-08-26 ENCOUNTER — Encounter: Payer: Self-pay | Admitting: Internal Medicine

## 2021-08-26 ENCOUNTER — Other Ambulatory Visit (INDEPENDENT_AMBULATORY_CARE_PROVIDER_SITE_OTHER): Payer: 59

## 2021-08-26 ENCOUNTER — Ambulatory Visit (INDEPENDENT_AMBULATORY_CARE_PROVIDER_SITE_OTHER): Payer: 59 | Admitting: Internal Medicine

## 2021-08-26 ENCOUNTER — Other Ambulatory Visit: Payer: Self-pay

## 2021-08-26 VITALS — BP 120/78 | HR 79 | Ht 67.0 in | Wt 190.0 lb

## 2021-08-26 DIAGNOSIS — Z8379 Family history of other diseases of the digestive system: Secondary | ICD-10-CM

## 2021-08-26 DIAGNOSIS — K625 Hemorrhage of anus and rectum: Secondary | ICD-10-CM

## 2021-08-26 DIAGNOSIS — R1013 Epigastric pain: Secondary | ICD-10-CM

## 2021-08-26 DIAGNOSIS — R197 Diarrhea, unspecified: Secondary | ICD-10-CM

## 2021-08-26 DIAGNOSIS — K219 Gastro-esophageal reflux disease without esophagitis: Secondary | ICD-10-CM | POA: Diagnosis not present

## 2021-08-26 LAB — C-REACTIVE PROTEIN: CRP: <1 mg/dL (ref 0.5–20.0)

## 2021-08-26 MED ORDER — NA SULFATE-K SULFATE-MG SULF 17.5-3.13-1.6 GM/177ML PO SOLN
ORAL | 0 refills | Status: DC
  Filled 2021-08-26 – 2021-09-12 (×2): qty 354, 1d supply, fill #0

## 2021-08-26 NOTE — Progress Notes (Signed)
Chief Complaint: Diarrhea, rectal bleeding, abdominal pain  HPI : 40 year old male with history of asthma, GERD, ADHD, and HTN presents with diarrhea, rectal bleeding, and epigastric ab pain  For several years, he has had issues with rectal bleeding and diarrhea. Over the last year he feels like these symptoms have gotten worse. His mother had Crohn's disease and had similar symptoms. He has blood tinged stool with diarrhea 3 times per week on average. The stool is bright red. Has 2-4 BMs per day that are loose. Denies prior colonoscopy.  Denies rectal pain. He does have issues with ab discomfort in the epigastric area. He will wake up nauseous in the morning and has for years. He was diagnosed with GERD years ago (which presented as a cough despite inhaler use) and is currently on Nexium QD therapy.  For the most part his Nexium is able to keep his reflux under control.  He occasionally has to take twice daily Nexium if he has an exacerbation in symptoms.  However despite taking his Nexium, he has still had issues with epigastric abdominal discomfort.  Denies vomiting, dysphagia, weight loss. Denies prior abdominal surgeries. On average has about 3-4 alcoholic drinks per day.  He works as a Airline pilot at [employer].   [employer] from Last 3 Encounters:  08/26/21 190 lb (86.2 kg)  07/24/21 186 lb 4 oz (84.5 kg)  07/03/20 172 lb (78 kg)   Past Medical History:  Diagnosis Date   ADD (attention deficit disorder)    Asthma    Dislocation of left patella 12/22/2018   GERD (gastroesophageal reflux disease)    Herpes simplex    Hypertension    Varicose veins      Past Surgical History:  Procedure Laterality Date   ENDOVENOUS ABLATION SAPHENOUS VEIN W/ LASER Left 06/19/2020   endovenous laser ablation left greater saphenous vein and stab phlebectomy > 20 incisions left leg by [name] MD    TYMPANOSTOMY TUBE PLACEMENT     Family History  Problem Relation Age of Onset    Crohn's disease Mother    Diabetes Mother    Squamous cell carcinoma Mother    Basal cell carcinoma Mother    Hypertension Father    GER disease Father    Asthma Father    Lung disease Maternal Grandmother        fibrotic disease   Brain cancer Maternal Grandfather 76   Lung cancer Paternal Grandmother    Bladder Cancer Paternal Grandfather    Colon cancer Neg Hx    Stomach cancer Neg Hx    Pancreatic cancer Neg Hx    Social History   Tobacco Use   Smoking status: Never   Smokeless tobacco: Never  Vaping Use   Vaping Use: Never used  Substance Use Topics   Alcohol use: Yes    Comment: 2-3 typicall day, weekends 6-10   Drug use: No   Current Outpatient Medications  Medication Sig Dispense Refill   albuterol (VENTOLIN HFA) 108 (90 Base) MCG/ACT inhaler Inhale 2 puffs into the lungs every 6 (six) hours as needed for wheezing or shortness of breath. 18 g 2   cetirizine (ZYRTEC) 10 MG tablet Take 10 mg by mouth daily as needed for allergies.     esomeprazole (NEXIUM) 40 MG capsule Take 1 capsule (40 mg total) by mouth daily at 12 noon. 90 capsule 2   Fluticasone-Salmeterol (ADVAIR) 100-50 MCG/DOSE AEPB Inhale 1 puff into the lungs 2 (two)  times daily. 60 each 0   losartan (COZAAR) 50 MG tablet TAKE 1 TABLET BY MOUTH 2 (TWO) TIMES DAILY. 180 tablet 3   SUPREP BOWEL PREP KIT 17.5-3.13-1.6 GM/177ML SOLN Suprep-Use as directed 354 mL 0   tiZANidine (ZANAFLEX) 4 MG capsule Take 1 capsule (4 mg total) by mouth 3 (three) times daily as needed for muscle spasms. 90 capsule 0   valACYclovir (VALTREX) 1000 MG tablet Take 1 tablet (1,000 mg total) by mouth daily. 90 tablet 0   No current facility-administered medications for this visit.   No Known Allergies   Review of Systems: All systems reviewed and negative except where noted in HPI.   Physical Exam: BP 120/78   Pulse 79   Ht 5' 7"  (1.702 m)   Wt 190 lb (86.2 kg)   SpO2 99%   BMI 29.76 kg/m  Constitutional:  Pleasant,well-developed, male in no acute distress. HEENT: Normocephalic and atraumatic. Conjunctivae are normal. No scleral icterus. Cardiovascular: Normal rate, regular rhythm.  Pulmonary/chest: Effort normal and breath sounds normal. No wheezing, rales or rhonchi. Abdominal: Soft, nondistended, nontender. Bowel sounds active throughout. Extremities: No edema Neurological: Alert and oriented to person place and time. Skin: Skin is warm and dry. No rashes noted. Psychiatric: Normal mood and affect. Behavior is normal.  Labs 07/2021: Positive FOBT, giardia and crypto are negative. Fecal calprotectin negative. O&P negative. HCV antibody negative. CMP nml. CBC nml. Celiac panel negative.  Labs 05/2020: TSH nml.  ASSESSMENT AND PLAN: Diarrhea Rectal bleeding Epigastric abdominal pain GERD Family history of Crohn's disease Patient presents with longstanding diarrhea and rectal bleeding for years. Recent infectious work-up by his PCP with Giardia, Cryptosporidium, and O&P were negative. Fecal calprotectin was normal, and FOBT was positive at that time. With his family history of Crohn's disease, would still be concerned about potential undiagnosed IBD. Thus will plan for colonoscopy for further evaluation. 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 Also has had issues with epigastric abdominal pain, which has not been responsive to PPI therapy (even when the patient takes it occasionally BID). Thus will plan for further evaluation with EGD at the same time as his colonoscopy to look for PUD, esophagitis, or gastritis/duodenitis. - Continue Nexium QD - Check CRP - Check GI pathogen panel, C dif PCR - EGD/Colonoscopy [facility]  Christia Reading, MD  I spent 60 minutes of time, including in depth chart review, independent review of results as outlined above, communicating results with the patient directly, face-to-face time with  the patient, coordinating care, ordering studies and medications as appropriate, and documentation.

## 2021-08-26 NOTE — Patient Instructions (Addendum)
If you are age 42 or older, your body mass index should be between 23-30. Your Body mass index is 29.76 kg/m. If this is out of the aforementioned range listed, please consider follow up with your Primary Care Provider.  If you are age 47 or younger, your body mass index should be between 19-25. Your Body mass index is 29.76 kg/m. If this is out of the aformentioned range listed, please consider follow up with your Primary Care Provider.   ________________________________________________________  The [facility] providers would like to encourage you to use [facility] to communicate with providers for non-urgent requests or questions.  Due to long hold times on the telephone, sending your provider a message by [facility] may be a faster and more efficient way to get a response.  Please allow 48 business hours for a response.  Please remember that this is for non-urgent requests.  _______________________________________________________  Bradley Schaefer have been scheduled for an endoscopy and colonoscopy. Please follow the written instructions given to you at your visit today. Please pick up your prep supplies at the pharmacy within the next 1-3 days. If you use inhalers (even only as needed), please bring them with you on the day of your procedure.   Please go to the lab in the basement of our building to have lab work done as you leave today. Hit "B" for basement when you get on the elevator.  When the doors open the lab is on your left.  We will call you with the results. Thank you.  Thank you for entrusting me with your care and for choosing [facility], Dr. Christia Reading

## 2021-08-27 ENCOUNTER — Other Ambulatory Visit: Payer: Self-pay

## 2021-09-11 ENCOUNTER — Other Ambulatory Visit: Payer: 59

## 2021-09-11 ENCOUNTER — Other Ambulatory Visit: Payer: Self-pay

## 2021-09-11 DIAGNOSIS — R197 Diarrhea, unspecified: Secondary | ICD-10-CM

## 2021-09-11 DIAGNOSIS — Z8379 Family history of other diseases of the digestive system: Secondary | ICD-10-CM

## 2021-09-11 DIAGNOSIS — K625 Hemorrhage of anus and rectum: Secondary | ICD-10-CM | POA: Diagnosis not present

## 2021-09-11 DIAGNOSIS — R1013 Epigastric pain: Secondary | ICD-10-CM | POA: Diagnosis not present

## 2021-09-12 ENCOUNTER — Other Ambulatory Visit: Payer: Self-pay

## 2021-09-12 LAB — CLOSTRIDIUM DIFFICILE TOXIN B, QUALITATIVE, REAL-TIME PCR: Toxigenic C. Difficile by PCR: NOT DETECTED

## 2021-09-15 LAB — GI PROFILE, STOOL, PCR

## 2021-09-16 ENCOUNTER — Other Ambulatory Visit: Payer: Self-pay | Admitting: Family Medicine

## 2021-09-16 ENCOUNTER — Other Ambulatory Visit: Payer: Self-pay

## 2021-09-16 MED ORDER — LOSARTAN POTASSIUM 50 MG PO TABS
ORAL_TABLET | Freq: Two times a day (BID) | ORAL | 3 refills | Status: DC
  Filled 2021-09-16: qty 180, 90d supply, fill #0
  Filled 2021-12-08: qty 180, 90d supply, fill #1
  Filled 2022-03-19: qty 180, 90d supply, fill #2
  Filled 2022-06-25: qty 180, 90d supply, fill #3

## 2021-09-18 ENCOUNTER — Telehealth: Payer: Self-pay | Admitting: Internal Medicine

## 2021-09-18 NOTE — Telephone Encounter (Signed)
Called the patient since he had some questions regarding his positive norovirus test.  I told the patient that it is not fully clear at this time what the clinical significance of the positive norovirus test is since patient does not describe any recent worsening of his diarrheal symptoms.  He may have had norovirus infection in the past or had a subclinical infection with norovirus. 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 We will plan to assess whether or not there are any signs of inflammation during his upcoming EGD and colonoscopy procedures.

## 2021-09-28 ENCOUNTER — Encounter: Payer: Self-pay | Admitting: Certified Registered Nurse Anesthetist

## 2021-09-29 ENCOUNTER — Ambulatory Visit (AMBULATORY_SURGERY_CENTER): Payer: 59 | Admitting: Internal Medicine

## 2021-09-29 ENCOUNTER — Encounter: Payer: Self-pay | Admitting: Internal Medicine

## 2021-09-29 ENCOUNTER — Other Ambulatory Visit: Payer: Self-pay

## 2021-09-29 VITALS — BP 144/83 | HR 78 | Temp 98.6°F | Resp 10 | Ht 67.0 in | Wt 190.0 lb

## 2021-09-29 DIAGNOSIS — D123 Benign neoplasm of transverse colon: Secondary | ICD-10-CM

## 2021-09-29 DIAGNOSIS — R197 Diarrhea, unspecified: Secondary | ICD-10-CM

## 2021-09-29 DIAGNOSIS — K2970 Gastritis, unspecified, without bleeding: Secondary | ICD-10-CM

## 2021-09-29 DIAGNOSIS — K625 Hemorrhage of anus and rectum: Secondary | ICD-10-CM | POA: Diagnosis not present

## 2021-09-29 DIAGNOSIS — R1310 Dysphagia, unspecified: Secondary | ICD-10-CM | POA: Diagnosis not present

## 2021-09-29 DIAGNOSIS — K649 Unspecified hemorrhoids: Secondary | ICD-10-CM

## 2021-09-29 DIAGNOSIS — K648 Other hemorrhoids: Secondary | ICD-10-CM | POA: Diagnosis not present

## 2021-09-29 DIAGNOSIS — K449 Diaphragmatic hernia without obstruction or gangrene: Secondary | ICD-10-CM | POA: Diagnosis not present

## 2021-09-29 DIAGNOSIS — I10 Essential (primary) hypertension: Secondary | ICD-10-CM | POA: Diagnosis not present

## 2021-09-29 DIAGNOSIS — J45909 Unspecified asthma, uncomplicated: Secondary | ICD-10-CM | POA: Diagnosis not present

## 2021-09-29 DIAGNOSIS — D120 Benign neoplasm of cecum: Secondary | ICD-10-CM | POA: Diagnosis not present

## 2021-09-29 DIAGNOSIS — R1013 Epigastric pain: Secondary | ICD-10-CM | POA: Diagnosis not present

## 2021-09-29 DIAGNOSIS — K219 Gastro-esophageal reflux disease without esophagitis: Secondary | ICD-10-CM

## 2021-09-29 DIAGNOSIS — K5730 Diverticulosis of large intestine without perforation or abscess without bleeding: Secondary | ICD-10-CM

## 2021-09-29 MED ORDER — HYDROCORTISONE (PERIANAL) 2.5 % EX CREA
1.0000 "application " | TOPICAL_CREAM | Freq: Two times a day (BID) | CUTANEOUS | 0 refills | Status: AC
  Filled 2021-09-29: qty 28, 7d supply, fill #0

## 2021-09-29 MED ORDER — SODIUM CHLORIDE 0.9 % IV SOLN: 500.0000 mL | Freq: Once | INTRAVENOUS | Status: DC

## 2021-09-29 NOTE — Progress Notes (Signed)
VS by DT    

## 2021-09-29 NOTE — Progress Notes (Signed)
Called to room to assist during endoscopic procedure.  Patient ID and intended procedure confirmed with present staff. Received instructions for my participation in the procedure from the performing physician.  

## 2021-09-29 NOTE — Patient Instructions (Signed)
YOU HAD AN ENDOSCOPIC PROCEDURE TODAY: Refer to the procedure report and other information in the discharge instructions given to you for any specific questions about what was found during the examination. If this information does not answer your questions, please call [place] office at [phone] to clarify.  ° °YOU SHOULD EXPECT: Some feelings of bloating in the abdomen. Passage of more gas than usual. Walking can help get rid of the air that was put into your GI tract during the procedure and reduce the bloating. If you had a lower endoscopy (such as a colonoscopy or flexible sigmoidoscopy) you may notice spotting of blood in your stool or on the toilet paper. Some abdominal soreness may be present for a day or two, also. ° °DIET: Your first meal following the procedure should be a light meal and then it is ok to progress to your normal diet. A half-sandwich or bowl of soup is an example of a good first meal. Heavy or fried foods are harder to digest and may make you feel nauseous or bloated. Drink plenty of fluids but you should avoid alcoholic beverages for 24 hours. If you had a esophageal dilation, please see attached instructions for diet.   ° °ACTIVITY: Your care partner should take you home directly after the procedure. You should plan to take it easy, moving slowly for the rest of the day. You can resume normal activity the day after the procedure however YOU SHOULD NOT DRIVE, use power tools, machinery or perform tasks that involve climbing or major physical exertion for 24 hours (because of the sedation medicines used during the test).  ° °SYMPTOMS TO REPORT IMMEDIATELY: °A gastroenterologist can be reached at any hour. Please call [phone]  for any of the following symptoms:  °Following lower endoscopy (colonoscopy, flexible sigmoidoscopy) °Excessive amounts of blood in the stool  °Significant tenderness, worsening of abdominal pains  °Swelling of the abdomen that is new, acute  °Fever of 100° or  higher  °Following upper endoscopy (EGD, EUS, ERCP, esophageal dilation) °Vomiting of blood or coffee ground material  °New, significant abdominal pain  °New, significant chest pain or pain under the shoulder blades  °Painful or persistently difficult swallowing  °New shortness of breath  °Black, tarry-looking or red, bloody stools ° °FOLLOW UP:  °If any biopsies were taken you will be contacted by phone or by letter within the next 1-3 weeks. Call [phone]  if you have not heard about the biopsies in 3 weeks.  °Please also call with any specific questions about appointments or follow up tests. ° °

## 2021-09-29 NOTE — Progress Notes (Signed)
Report given to PACU, vss 

## 2021-09-29 NOTE — Progress Notes (Signed)
1450 HR > 100 with esmolol 25 mg given IV, MD updated, vss  

## 2021-09-29 NOTE — Progress Notes (Signed)
1433 Pt experienced laryngeal spasm with jaw thrust, also wheezing noted.   vss

## 2021-09-29 NOTE — Progress Notes (Signed)
GASTROENTEROLOGY PROCEDURE H&P NOTE   Primary Care Physician: Lesleigh Noe, MD    Reason for Procedure:   Diarrhea, rectal bleeding, epigastric ab pain  Plan:    EGD/Colonoscopy  Patient is appropriate for endoscopic procedure(s) in the ambulatory ([place]) setting.  The nature of the procedure, as well as the risks, benefits, and alternatives were carefully and thoroughly reviewed with the patient. Ample time for discussion and questions allowed. The patient understood, was satisfied, and agreed to proceed.     HPI: Bradley Schaefer is a 40 y.o. male who presents for EGD/colonoscopy for evaluation of diarrhea, rectal bleeding, epigastric ab pain .  Patient was most recently seen in the Gastroenterology Clinic on 08/26/21.  No interval change in medical history since that appointment. Please refer to that note for full details regarding GI history and clinical presentation.   Past Medical History:  Diagnosis Date   ADD (attention deficit disorder)    Asthma    Dislocation of left patella 12/22/2018   GERD (gastroesophageal reflux disease)    Herpes simplex    Hypertension    Varicose veins     Past Surgical History:  Procedure Laterality Date   ENDOVENOUS ABLATION SAPHENOUS VEIN W/ LASER Left 06/19/2020   endovenous laser ablation left greater saphenous vein and stab phlebectomy > 20 incisions left leg by [name] MD    TYMPANOSTOMY TUBE PLACEMENT      Prior to Admission medications   Medication Sig Start Date End Date Taking? Authorizing Provider  esomeprazole (NEXIUM) 40 MG capsule Take 1 capsule (40 mg total) by mouth daily at 12 noon. 03/28/21  Yes Lesleigh Noe, MD  losartan (COZAAR) 50 MG tablet TAKE 1 TABLET BY MOUTH 2 (TWO) TIMES DAILY. 09/16/21 09/16/22 Yes Lesleigh Noe, MD  albuterol (VENTOLIN HFA) 108 (90 Base) MCG/ACT inhaler Inhale 2 puffs into the lungs every 6 (six) hours as needed for wheezing or shortness of breath. 05/10/20   Lesleigh Noe, MD   cetirizine (ZYRTEC) 10 MG tablet Take 10 mg by mouth daily as needed for allergies.    [provider]  Fluticasone-Salmeterol (ADVAIR) 100-50 MCG/DOSE AEPB Inhale 1 puff into the lungs 2 (two) times daily. 06/27/20   Lesleigh Noe, MD  tiZANidine (ZANAFLEX) 4 MG capsule Take 1 capsule (4 mg total) by mouth 3 (three) times daily as needed for muscle spasms. 07/24/21   Lesleigh Noe, MD  valACYclovir (VALTREX) 1000 MG tablet Take 1 tablet (1,000 mg total) by mouth daily. 07/24/21   Lesleigh Noe, MD    Current Outpatient Medications  Medication Sig Dispense Refill   esomeprazole (NEXIUM) 40 MG capsule Take 1 capsule (40 mg total) by mouth daily at 12 noon. 90 capsule 2   losartan (COZAAR) 50 MG tablet TAKE 1 TABLET BY MOUTH 2 (TWO) TIMES DAILY. 180 tablet 3   albuterol (VENTOLIN HFA) 108 (90 Base) MCG/ACT inhaler Inhale 2 puffs into the lungs every 6 (six) hours as needed for wheezing or shortness of breath. 18 g 2   cetirizine (ZYRTEC) 10 MG tablet Take 10 mg by mouth daily as needed for allergies.     Fluticasone-Salmeterol (ADVAIR) 100-50 MCG/DOSE AEPB Inhale 1 puff into the lungs 2 (two) times daily. 60 each 0   tiZANidine (ZANAFLEX) 4 MG capsule Take 1 capsule (4 mg total) by mouth 3 (three) times daily as needed for muscle spasms. 90 capsule 0   valACYclovir (VALTREX) 1000 MG tablet Take 1 tablet (1,000 mg  total) by mouth daily. 90 tablet 0   Current Facility-Administered Medications  Medication Dose Route Frequency Provider Last Rate Last Admin   0.9 %  sodium chloride infusion  500 mL Intravenous Once Sharyn Creamer, MD        Allergies as of 09/29/2021   (No Known Allergies)    Family History  Problem Relation Age of Onset   Crohn's disease Mother    Diabetes Mother    Squamous cell carcinoma Mother    Basal cell carcinoma Mother    Hypertension Father    GER disease Father    Asthma Father    Lung disease Maternal Grandmother        fibrotic disease   Brain  cancer Maternal Grandfather 28   Lung cancer Paternal Grandmother    Bladder Cancer Paternal Grandfather    Colon cancer Neg Hx    Stomach cancer Neg Hx    Pancreatic cancer Neg Hx     Social History   Socioeconomic History   Marital status: Married    Spouse name: Cyril Mourning   Number of children: 3   Years of education: Not on file   Highest education level: Not on file  Occupational History   Not on file  Tobacco Use   Smoking status: Never   Smokeless tobacco: Never  Vaping Use   Vaping Use: Never used  Substance and Sexual Activity   Alcohol use: Yes    Comment: 2-3 typicall day, weekends 6-10   Drug use: No   Sexual activity: Yes    Birth control/protection: I.U.D.    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      Family: son - Will (2007) - gets him about 50% of the time, Cyril Mourning has 2 children as well, parents nearby with a good relationship      Enjoys: spend time with wife, go the beach, jeep activities      Exercise: just busy life   Diet: typically 1 meal per day      Safety   Seat belts: Yes    Guns: No   Safe in relationships: Yes    Social Determinants of Radio broadcast assistant Strain: Not on file  Food Insecurity: Not on file  Transportation Needs: Not on file  Physical Activity: Not on file  Stress: Not on file  Social Connections: Not on file  Intimate Partner Violence: Not on file    Physical Exam: Vital signs in last 24 hours: BP 131/77   Pulse 90   Temp 98.6 F (37 C)   Ht 5\' 7"  (1.702 m)   Wt 190 lb (86.2 kg)   SpO2 96%   BMI 29.76 kg/m  GEN: NAD EYE: Sclerae anicteric ENT: MMM CV: Non-tachycardic Pulm: No increased WOB GI: Soft NEURO:  Alert & Oriented   Christia Reading, MD [facility]   09/29/2021 1:36 PM

## 2021-09-29 NOTE — Progress Notes (Deleted)
Report given to PACU, vss 

## 2021-09-29 NOTE — Op Note (Addendum)
[place] Patient Name: Bradley Schaefer Procedure Date: 09/29/2021 2:09 PM MRN: [id number] Endoscopist: Sonny Masters "Bradley Schaefer ,  Age: 40 Referring MD:  Date of Birth: 22-Jun-1981 Gender: Male Account #: [id number] Procedure:                Colonoscopy Indications:              Clinically significant diarrhea of unexplained                            origin, Rectal bleeding Medicines:                Monitored Anesthesia Care Procedure:                Pre-Anesthesia Assessment:                           - Prior to the procedure, a History and Physical                            was performed, and patient medications and                            allergies were reviewed. The patient's tolerance of                            previous anesthesia was also reviewed. The risks                            and benefits of the procedure and the sedation                            options and risks were discussed with the patient.                            All questions were answered, and informed consent                            was obtained. Prior Anticoagulants: The patient has                            taken no previous anticoagulant or antiplatelet                            agents. ASA Grade Assessment: II - A patient with                            mild systemic disease. After reviewing the risks                            and benefits, the patient was deemed in                            satisfactory condition to undergo the procedure.  After obtaining informed consent, the colonoscope                            was passed under direct vision. Throughout the                            procedure, the patient's blood pressure, pulse, and                            oxygen saturations were monitored continuously. The                            CF [id number] #[id number] was introduced through the anus                            and advanced to the the cecum,  identified by                            appendiceal orifice and ileocecal valve. The                            colonoscopy was performed without difficulty. The                            patient tolerated the procedure well. The quality                            of the bowel preparation was adequate. Scope In: 2:36:54 PM Scope Out: 2:56:45 PM Scope Withdrawal Time: 0 hours 18 minutes 29 seconds  Total Procedure Duration: 0 hours 19 minutes 51 seconds  Findings:                 Multiple small and large-mouthed diverticula were                            found in the entire colon.                           A 2 mm polyp was found in the cecum. The polyp was                            sessile. The polyp was removed with a jumbo cold                            forceps. Resection and retrieval were complete.                           Three sessile polyps were found in the transverse                            colon. The polyps were 3 to 5 mm in size. These  polyps were removed with a cold snare. Resection                            and retrieval were complete.                           Non-bleeding internal hemorrhoids were found during                            retroflexion.                           Biopsies were taken with a cold forceps in the                            entire colon for histology. Complications:            No immediate complications. Estimated Blood Loss:     Estimated blood loss was minimal. Impression:               - Diverticulosis in the entire examined colon.                           - One 2 mm polyp in the cecum, removed with a jumbo                            cold forceps. Resected and retrieved.                           - Three 3 to 5 mm polyps in the transverse colon,                            removed with a cold snare. Resected and retrieved.                           - Non-bleeding internal hemorrhoids.                            - Biopsies were taken with a cold forceps for                            histology in the entire colon. Recommendation:           - Discharge patient to home (with escort).                           - It is suspected that your rectal bleeding is due                            to hemorrhoids.                           - Await pathology results.                           - Start hydrocortisone HC cream twice daily  for 7                            days.                           - Return to GI clinic in 2 months to see if there                            is response to PPI therapy.                           - The findings and recommendations were discussed                            with the patient. Sonny Masters "Bradley Schaefer,  09/29/2021 3:09:43 PM

## 2021-09-29 NOTE — Progress Notes (Signed)
1420 Robinul 0.1 mg IV given due large amount of secretions upon assessment.  MD made aware, vss 

## 2021-09-29 NOTE — Op Note (Signed)
[facility] Patient Name: Bradley Schaefer Procedure Date: 09/29/2021 2:17 PM MRN: [id number] Endoscopist: Sonny Masters "Christia Reading ,  Age: 40 Referring MD:  Date of Birth: October 07, 1981 Gender: Male Account #: [id number] Procedure:                Upper GI endoscopy Indications:              Epigastric abdominal pain Medicines:                Monitored Anesthesia Care Procedure:                Pre-Anesthesia Assessment:                           - Prior to the procedure, a History and Physical                            was performed, and patient medications and                            allergies were reviewed. The patient's tolerance of                            previous anesthesia was also reviewed. The risks                            and benefits of the procedure and the sedation                            options and risks were discussed with the patient.                            All questions were answered, and informed consent                            was obtained. Prior Anticoagulants: The patient has                            taken no previous anticoagulant or antiplatelet                            agents. ASA Grade Assessment: II - A patient with                            mild systemic disease. After reviewing the risks                            and benefits, the patient was deemed in                            satisfactory condition to undergo the procedure.                           After obtaining informed consent, the endoscope was  passed under direct vision. Throughout the                            procedure, the patient's blood pressure, pulse, and                            oxygen saturations were monitored continuously. The                            Endoscope was introduced through the mouth, and                            advanced to the second part of duodenum. The upper                            GI endoscopy was accomplished  without difficulty.                            The patient tolerated the procedure well. Scope In: Scope Out: Findings:                 The examined esophagus was normal.                           A hiatal hernia was present.                           Scattered inflammation characterized by adherent                            blood, erosions and erythema was found in the                            gastric body. Biopsies were taken with a cold                            forceps for histology.                           The examined duodenum was normal. Biopsies for                            histology were taken with a cold forceps for                            evaluation of celiac disease. Complications:            No immediate complications. Estimated Blood Loss:     Estimated blood loss was minimal. Impression:               - Normal esophagus.                           - Hiatal hernia.                           - Gastritis. Biopsied.                           -  Normal examined duodenum. Biopsied. Recommendation:           - Await pathology results.                           - Use a proton pump inhibitor PO BID for 8 weeks.                           - Perform a colonoscopy today. Sonny Masters "Christia Reading,  09/29/2021 3:04:09 PM

## 2021-09-29 NOTE — Progress Notes (Signed)
1434 HR > 100 with esmolol 25 mg given IV and Albuterol neb given, MD updated. vss

## 2021-09-29 NOTE — Progress Notes (Signed)
1439 HR > 100 with esmolol 25 mg given IV, MD updated, vss

## 2021-09-29 NOTE — Progress Notes (Signed)
1449 Nasopharyngeal airway size 7.0  placed without trauma, vss

## 2021-09-30 ENCOUNTER — Other Ambulatory Visit: Payer: Self-pay

## 2021-10-01 ENCOUNTER — Telehealth: Payer: Self-pay | Admitting: *Deleted

## 2021-10-01 ENCOUNTER — Telehealth: Payer: Self-pay

## 2021-10-01 NOTE — Telephone Encounter (Signed)
  Follow up Call-  Call back number 09/29/2021  Post procedure Call Back phone  # [phone]  Permission to leave phone message Yes  Some recent data might be hidden     Patient questions:  Do you have a fever, pain , or abdominal swelling? No. Pain Score  0 *  Have you tolerated food without any problems? Yes.    Have you been able to return to your normal activities? Yes.    Do you have any questions about your discharge instructions: Diet   No. Medications  No. Follow up visit  No.  Do you have questions or concerns about your Care? No.  Actions: * If pain score is 4 or above: No action needed, pain <4.  Have you developed a fever since your procedure? no  2.   Have you had an respiratory symptoms (SOB or cough) since your procedure? no  3.   Have you tested positive for COVID 19 since your procedure no  4.   Have you had any family members/close contacts diagnosed with the COVID 19 since your procedure?  n   If yes to any of these questions please route to Joylene John, RN and Joella Prince, RN

## 2021-10-01 NOTE — Telephone Encounter (Signed)
  Follow up Call-  Call back number 09/29/2021  Post procedure Call Back phone  # [phone]  Permission to leave phone message Yes  Some recent data might be hidden     First attempt for follow up phone call. No answer at number given.  Left message on voicemail.

## 2021-10-02 ENCOUNTER — Other Ambulatory Visit: Payer: Self-pay

## 2021-10-02 DIAGNOSIS — K219 Gastro-esophageal reflux disease without esophagitis: Secondary | ICD-10-CM

## 2021-10-02 MED ORDER — ESOMEPRAZOLE MAGNESIUM 40 MG PO CPDR
40.0000 mg | DELAYED_RELEASE_CAPSULE | Freq: Two times a day (BID) | ORAL | 2 refills | Status: DC
  Filled 2021-10-02 – 2021-11-17 (×2): qty 60, 30d supply, fill #0
  Filled 2021-12-08 – 2021-12-11 (×2): qty 60, 30d supply, fill #1
  Filled 2022-01-13: qty 60, 30d supply, fill #2

## 2021-10-03 ENCOUNTER — Ambulatory Visit: Payer: 59 | Admitting: Internal Medicine

## 2021-10-03 ENCOUNTER — Encounter: Payer: Self-pay | Admitting: Internal Medicine

## 2021-10-03 ENCOUNTER — Other Ambulatory Visit: Payer: Self-pay

## 2021-10-03 VITALS — BP 114/78 | HR 92 | Temp 97.7°F | Ht 67.0 in | Wt 187.0 lb

## 2021-10-03 DIAGNOSIS — Z9189 Other specified personal risk factors, not elsewhere classified: Secondary | ICD-10-CM | POA: Insufficient documentation

## 2021-10-03 DIAGNOSIS — J4521 Mild intermittent asthma with (acute) exacerbation: Secondary | ICD-10-CM | POA: Diagnosis not present

## 2021-10-03 DIAGNOSIS — J301 Allergic rhinitis due to pollen: Secondary | ICD-10-CM

## 2021-10-03 DIAGNOSIS — G4730 Sleep apnea, unspecified: Secondary | ICD-10-CM

## 2021-10-03 MED ORDER — ALBUTEROL SULFATE HFA 108 (90 BASE) MCG/ACT IN AERS
2.0000 | INHALATION_SPRAY | Freq: Four times a day (QID) | RESPIRATORY_TRACT | 2 refills | Status: DC | PRN
Start: 2021-10-03 — End: 2023-10-28
  Filled 2021-10-03: qty 18, 25d supply, fill #0

## 2021-10-03 MED ORDER — PREDNISONE 20 MG PO TABS
40.0000 mg | ORAL_TABLET | Freq: Every day | ORAL | 0 refills | Status: DC
  Filled 2021-10-03: qty 9, 6d supply, fill #0

## 2021-10-03 MED ORDER — FLUTICASONE-SALMETEROL 100-50 MCG/ACT IN AEPB
1.0000 | INHALATION_SPRAY | Freq: Two times a day (BID) | RESPIRATORY_TRACT | 11 refills | Status: DC
  Filled 2021-10-03: qty 60, 30d supply, fill #0

## 2021-10-03 NOTE — Assessment & Plan Note (Signed)
Worrisome symptoms Will set up with pulmonary for evaluation

## 2021-10-03 NOTE — Assessment & Plan Note (Signed)
Will continue twice a day advair for at least the next few weeks If able to stop again (seems reasonable to try) ---should take at the start of any cold symptoms Will give 6 days of prednisone now Refill the albuterol inhaler

## 2021-10-03 NOTE — Progress Notes (Signed)
Subjective:    Patient ID: Bradley Schaefer, male    DOB: August 20, 1981, 40 y.o.   MRN: [id number]  HPI Here due to asthma flare This visit occurred during the SARS-CoV-2 public health emergency.  Safety protocols were in place, including screening questions prior to the visit, additional usage of staff PPE, and extensive cleaning of exam room while observing appropriate contact time as indicated for disinfecting solutions.   Anaesthesiologist noted apnea during colonoscopy Asthma flared---needed some breathing treatments before he could leave Now using albuterol regularly and restarted advair Hadn't had problems before this---running 3 miles, etc  Does have allergy issues Year round symptoms Takes zyrtec  COVID back in March Snoring bad since then May be having apnea as well  Has gotten improvement in asthma with prednisone in the past  Current Outpatient Medications on File Prior to Visit  Medication Sig Dispense Refill   albuterol (VENTOLIN HFA) 108 (90 Base) MCG/ACT inhaler Inhale 2 puffs into the lungs every 6 (six) hours as needed for wheezing or shortness of breath. 18 g 2   cetirizine (ZYRTEC) 10 MG tablet Take 10 mg by mouth daily as needed for allergies.     esomeprazole (NEXIUM) 40 MG capsule Take 1 capsule (40 mg total) by mouth 2 (two) times daily before a meal. 60 capsule 2   hydrocortisone (ANUSOL-HC) 2.5 % rectal cream Place 1 application rectally 2 (two) times daily for 7 days. 28 g 0   losartan (COZAAR) 50 MG tablet TAKE 1 TABLET BY MOUTH 2 (TWO) TIMES DAILY. 180 tablet 3   tiZANidine (ZANAFLEX) 4 MG capsule Take 1 capsule (4 mg total) by mouth 3 (three) times daily as needed for muscle spasms. 90 capsule 0   valACYclovir (VALTREX) 1000 MG tablet Take 1 tablet (1,000 mg total) by mouth daily. 90 tablet 0   No current facility-administered medications on file prior to visit.    No Known Allergies  Past Medical History:  Diagnosis Date   ADD (attention deficit  disorder)    Asthma    Dislocation of left patella 12/22/2018   GERD (gastroesophageal reflux disease)    Herpes simplex    Hypertension    Varicose veins     Past Surgical History:  Procedure Laterality Date   ENDOVENOUS ABLATION SAPHENOUS VEIN W/ LASER Left 06/19/2020   endovenous laser ablation left greater saphenous vein and stab phlebectomy > 20 incisions left leg by [name] MD    TYMPANOSTOMY TUBE PLACEMENT      Family History  Problem Relation Age of Onset   Crohn's disease Mother    Diabetes Mother    Squamous cell carcinoma Mother    Basal cell carcinoma Mother    Hypertension Father    GER disease Father    Asthma Father    Lung disease Maternal Grandmother        fibrotic disease   Brain cancer Maternal Grandfather 18   Lung cancer Paternal Grandmother    Bladder Cancer Paternal Grandfather    Colon cancer Neg Hx    Stomach cancer Neg Hx    Pancreatic cancer Neg Hx     Social History   Socioeconomic History   Marital status: Married    Spouse name: Cyril Mourning   Number of children: 3   Years of education: Not on file   Highest education level: Not on file  Occupational History   Not on file  Tobacco Use   Smoking status: Never   Smokeless tobacco: Never  Vaping Use   Vaping Use: Never used  Substance and Sexual Activity   Alcohol use: Yes    Comment: 2-3 typicall day, weekends 6-10   Drug use: No   Sexual activity: Yes    Birth control/protection: I.U.D.    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      Family: son - Will (2007) - gets him about 50% of the time, Cyril Mourning has 2 children as well, parents nearby with a good relationship      Enjoys: spend time with wife, go the beach, jeep activities      Exercise: just  busy life   Diet: typically 1 meal per day      Safety   Seat belts: Yes    Guns: No   Safe in relationships: Yes    Social Determinants of Radio broadcast assistant Strain: Not on file  Food Insecurity: Not on file  Transportation Needs: Not on file  Physical Activity: Not on file  Stress: Not on file  Social Connections: Not on file  Intimate Partner Violence: Not on file   Review of Systems No chronic cough Has gained some weight    Objective:   Physical Exam Constitutional:      Appearance: Normal appearance.  Cardiovascular:     Rate and Rhythm: Normal rate and regular rhythm.     Heart sounds: No murmur heard.   No gallop.  Pulmonary:     Effort: Pulmonary effort is normal.     Breath sounds: Normal breath sounds. No wheezing, rhonchi or rales.     Comments: Not tight or wheezy Musculoskeletal:     Cervical back: Neck supple.  Lymphadenopathy:     Cervical: No cervical adenopathy.  Neurological:     Mental Status: He is alert.           Assessment & Plan:

## 2021-10-03 NOTE — Assessment & Plan Note (Signed)
Takes cetirizine Could consider singulair if more symptoms

## 2021-10-04 ENCOUNTER — Encounter: Payer: Self-pay | Admitting: Internal Medicine

## 2021-10-08 ENCOUNTER — Other Ambulatory Visit (HOSPITAL_COMMUNITY): Payer: Self-pay

## 2021-11-04 ENCOUNTER — Other Ambulatory Visit: Payer: Self-pay

## 2021-11-06 ENCOUNTER — Telehealth: Payer: Self-pay

## 2021-11-06 NOTE — Telephone Encounter (Signed)
Called and spoke to patient about upcoming appointment. Patient has never had a sleep study.

## 2021-11-07 ENCOUNTER — Ambulatory Visit (INDEPENDENT_AMBULATORY_CARE_PROVIDER_SITE_OTHER): Payer: 59 | Admitting: Primary Care

## 2021-11-07 ENCOUNTER — Other Ambulatory Visit: Payer: Self-pay

## 2021-11-07 ENCOUNTER — Encounter: Payer: Self-pay | Admitting: Primary Care

## 2021-11-07 VITALS — BP 140/88 | HR 93 | Temp 98.2°F | Ht 67.0 in | Wt 193.8 lb

## 2021-11-07 DIAGNOSIS — Z9189 Other specified personal risk factors, not elsewhere classified: Secondary | ICD-10-CM

## 2021-11-07 DIAGNOSIS — J4521 Mild intermittent asthma with (acute) exacerbation: Secondary | ICD-10-CM

## 2021-11-07 DIAGNOSIS — R0683 Snoring: Secondary | ICD-10-CM | POA: Diagnosis not present

## 2021-11-07 DIAGNOSIS — J4530 Mild persistent asthma, uncomplicated: Secondary | ICD-10-CM

## 2021-11-07 MED ORDER — FLUTICASONE-SALMETEROL 250-50 MCG/ACT IN AEPB
1.0000 | INHALATION_SPRAY | Freq: Two times a day (BID) | RESPIRATORY_TRACT | 5 refills | Status: DC
Start: 2021-11-07 — End: 2022-07-15
  Filled 2021-11-07 (×3): qty 60, 30d supply, fill #0
  Filled 2021-12-08 (×2): qty 60, 30d supply, fill #1
  Filled 2022-01-08: qty 60, 30d supply, fill #2
  Filled 2022-02-09: qty 60, 30d supply, fill #3
  Filled 2022-03-19: qty 60, 30d supply, fill #4
  Filled 2022-04-13 – 2022-06-04 (×2): qty 60, 30d supply, fill #5

## 2021-11-07 NOTE — Progress Notes (Signed)
Reviewed and agree with assessment/plan.   Chesley Mires, MD [facility] Pulmonary/Critical Care 11/07/2021, 2:22 PM Pager:  [phone]

## 2021-11-07 NOTE — Assessment & Plan Note (Signed)
-   Patient has symptoms of loud snoring, witnessed apnea, restless sleep and daytime sleepiness. Epworth 9/24. I have a high suspicion patient has underlying sleep apnea, need home sleep study to evaluate for OSA. We reviewed risk of untreated sleep apnea including cardiac arrhythmias, pulm HTN, stroke or DM. We also discussed treatment options including weight loss, oral appliance, CPAP therapy or referral to ENT for possible surgical options. Advised patient to focus on side sleeping position and avoid excessive alcohol/sedating medication prior to bedtime. FU in 6 weeks to review results and treatment options further.

## 2021-11-07 NOTE — Patient Instructions (Addendum)
Recommendations: - Take 1 puff Advair 250-56mcg morning and evening (rinse mouth after use) --Use albuterol (ventolin) rescue inhaler 1-2 puffs every 4-6 hours as needed for breakthrough shortness or breath/wheezing - Continue Zyrtec 10mg  daily  RX: - Advair 250-22mcg   Orders: - HST - PFTs   Follow-up: - 6-8 weeks to review sleep study and PFT results   Sleep Apnea Sleep apnea affects breathing during sleep. It causes breathing to stop for 10 seconds or more, or to become shallow. People with sleep apnea usually snore loudly. It can also increase the risk of: Heart attack. Stroke. Being very overweight (obese). Diabetes. Heart failure. Irregular heartbeat. High blood pressure. The goal of treatment is to help you breathe normally again. What are the causes? The most common cause of this condition is a collapsed or blocked airway. There are three kinds of sleep apnea: Obstructive sleep apnea. This is caused by a blocked or collapsed airway. Central sleep apnea. This happens when the brain does not send the right signals to the muscles that control breathing. Mixed sleep apnea. This is a combination of obstructive and central sleep apnea. What increases the risk? Being overweight. Smoking. Having a small airway. Being older. Being male. Drinking alcohol. Taking medicines to calm yourself (sedatives or tranquilizers). Having family members with the condition. Having a tongue or tonsils that are larger than normal. What are the signs or symptoms? Trouble staying asleep. Loud snoring. Headaches in the morning. Waking up gasping. Dry mouth or sore throat in the morning. Being sleepy or tired during the day. If you are sleepy or tired during the day, you may also: Not be able to focus your mind (concentrate). Forget things. Get angry a lot and have mood swings. Feel sad (depressed). Have changes in your personality. Have less interest in sex, if you are male. Be  unable to have an erection, if you are male. How is this treated?  Sleeping on your side. Using a medicine to get rid of mucus in your nose (decongestant). Avoiding the use of alcohol, medicines to help you relax, or certain pain medicines (narcotics). Losing weight, if needed. Changing your diet. Quitting smoking. Using a machine to open your airway while you sleep, such as: An oral appliance. This is a mouthpiece that shifts your lower jaw forward. A CPAP device. This device blows air through a mask when you breathe out (exhale). An EPAP device. This has valves that you put in each nostril. A BIPAP device. This device blows air through a mask when you breathe in (inhale) and breathe out. Having surgery if other treatments do not work. Follow these instructions at home: Lifestyle Make changes that your doctor recommends. Eat a healthy diet. Lose weight if needed. Avoid alcohol, medicines to help you relax, and some pain medicines. Do not smoke or use any products that contain nicotine or tobacco. If you need help quitting, ask your doctor. General instructions Take over-the-counter and prescription medicines only as told by your doctor. If you were given a machine to use while you sleep, use it only as told by your doctor. If you are having surgery, make sure to tell your doctor you have sleep apnea. You may need to bring your device with you. Keep all follow-up visits. Contact a doctor if: The machine that you were given to use during sleep bothers you or does not seem to be working. You do not get better. You get worse. Get help right away if: Your chest hurts. You  have trouble breathing in enough air. You have an uncomfortable feeling in your back, arms, or stomach. You have trouble talking. One side of your body feels weak. A part of your face is hanging down. These symptoms may be an emergency. Get help right away. Call your local emergency services (911 in the [place] Do  not wait to see if the symptoms will go away. Do not drive yourself to the hospital. Summary This condition affects breathing during sleep. The most common cause is a collapsed or blocked airway. The goal of treatment is to help you breathe normally while you sleep. This information is not intended to replace advice given to you by your health care provider. Make sure you discuss any questions you have with your health care provider. Document Revised: 05/28/2021 Document Reviewed: 09/27/2020 Elsevier Patient Education  2022 [employer].  Asthma, Adult Asthma is a long-term (chronic) condition in which the airways get tight and narrow. The airways are the breathing passages that lead from the nose and mouth down into the lungs. A person with asthma will have times when symptoms get worse. These are called asthma attacks. They can cause coughing, whistling sounds when you breathe (wheezing), shortness of breath, and chest pain. They can make it hard to breathe. There is no cure for asthma, but medicines and lifestyle changes can help control it. There are many things that can bring on an asthma attack or make asthma symptoms worse (triggers). Common triggers include: Mold. Dust. Cigarette smoke. Cockroaches. Things that can cause allergy symptoms (allergens). These include animal skin flakes (dander) and pollen from trees or grass. Things that pollute the air. These may include household cleaners, wood smoke, smog, or chemical odors. Cold air, weather changes, and wind. Crying or laughing hard. Stress. Certain medicines or drugs. Certain foods such as dried fruit, potato chips, and grape juice. Infections, such as a cold or the flu. Certain medical conditions or diseases. Exercise or tiring activities. Asthma may be treated with medicines and by staying away from the things that cause asthma attacks. Types of medicines may include: Controller medicines. These help prevent asthma symptoms.  They are usually taken every day. Fast-acting reliever or rescue medicines. These quickly relieve asthma symptoms. They are used as needed and provide short-term relief. Allergy medicines if your attacks are brought on by allergens. Medicines to help control the body's defense (immune) system. Follow these instructions at home: Avoiding triggers in your home Change your heating and air conditioning filter often. Limit your use of fireplaces and wood stoves. Get rid of pests (such as roaches and mice) and their droppings. Throw away plants if you see mold on them. Clean your floors. Dust regularly. Use cleaning products that do not smell. Have someone vacuum when you are not home. Use a vacuum cleaner with a HEPA filter if possible. Replace carpet with wood, tile, or vinyl flooring. Carpet can trap animal skin flakes and dust. Use allergy-proof pillows, mattress covers, and box spring covers. Wash bed sheets and blankets every week in hot water. Dry them in a dryer. Keep your bedroom free of any triggers. Avoid pets and keep windows closed when things that cause allergy symptoms are in the air. Use blankets that are made of polyester or cotton. Clean bathrooms and kitchens with bleach. If possible, have someone repaint the walls in these rooms with mold-resistant paint. Keep out of the rooms that are being cleaned and painted. Wash your hands often with soap and water. If soap  and water are not available, use hand sanitizer. Do not allow anyone to smoke in your home. General instructions Take over-the-counter and prescription medicines only as told by your doctor. Talk with your doctor if you have questions about how or when to take your medicines. Make note if you need to use your medicines more often than usual. Do not use any products that contain nicotine or tobacco, such as cigarettes and e-cigarettes. If you need help quitting, ask your doctor. Stay away from secondhand smoke. Avoid  doing things outdoors when allergen counts are high and when air quality is low. Wear a ski mask when doing outdoor activities in the winter. The mask should cover your nose and mouth. Exercise indoors on cold days if you can. Warm up before you exercise. Take time to cool down after exercise. Use a peak flow meter as told by your doctor. A peak flow meter is a tool that measures how well the lungs are working. Keep track of the peak flow meter's readings. Write them down. Follow your asthma action plan. This is a written plan for taking care of your asthma and treating your attacks. Make sure you get all the shots (vaccines) that your doctor recommends. Ask your doctor about a flu shot and a pneumonia shot. Keep all follow-up visits as told by your doctor. This is important. Contact a doctor if: You have wheezing, shortness of breath, or a cough even while taking medicine to prevent attacks. The mucus you cough up (sputum) is thicker than usual. The mucus you cough up changes from clear or white to yellow, green, gray, or bloody. You have problems from the medicine you are taking, such as: A rash. Itching. Swelling. Trouble breathing. You need reliever medicines more than 2-3 times a week. Your peak flow reading is still at 50-79% of your personal best after following the action plan for 1 hour. You have a fever. Get help right away if: You seem to be worse and are not responding to medicine during an asthma attack. You are short of breath even at rest. You get short of breath when doing very little activity. You have trouble eating, drinking, or talking. You have chest pain or tightness. You have a fast heartbeat. Your lips or fingernails start to turn blue. You are light-headed or dizzy, or you faint. Your peak flow is less than 50% of your personal best. You feel too tired to breathe normally. Summary Asthma is a long-term (chronic) condition in which the airways get tight and  narrow. An asthma attack can make it hard to breathe. Asthma cannot be cured, but medicines and lifestyle changes can help control it. Make sure you understand how to avoid triggers and how and when to use your medicines. This information is not intended to replace advice given to you by your health care provider. Make sure you discuss any questions you have with your health care provider. Document Revised: 02/11/2020 Document Reviewed: 02/21/2020 Elsevier Patient Education  2022 [employer].

## 2021-11-07 NOTE — Assessment & Plan Note (Signed)
-   Increased sob symptoms since having covid. Using SABA 5-6 times a week. Recommend increasing Advair 250-3mcg 1 puff twice daily and getting updated PFTs. Continue Cetirizine. Consider adding Singulair if having more allergic rhinitis symptoms.

## 2021-11-07 NOTE — Progress Notes (Signed)
@Patient  ID: Bradley Schaefer, male    DOB: Oct 05, 1981, 41 y.o.   MRN: [id number]  Chief Complaint  Patient presents with   Consult    Referring provider: Venia Carbon, MD  HPI: 41 year old male, never smoked. PMH significant for HTN, mild intermittent asthma, ADHD.   11/07/2021 Patient presents today for sleep consult. He has symptoms of loud snoring, witnessed apnea, restless sleep and daytime sleepiness. Symptoms are worse since having covid in March 2022. He works day shift as a Marine scientist. He had a colonoscopy recently and had issues to apnea. Typical bedtime is between 10pm-12am. He wakes up on average 5-10 times a night.   He saw his PCP on 10/03/21 for mild intermittent asthma symptoms, treated with 6 day course of prednisone. Maintained on Advair 156mcg 1 puff twice daily and cetirizine. He has noticed increased shortness of breath as well since having covid. He uses his albuterol rescue inhaler 5-6 times a week.   Sleep questionnaire: Prior sleep study- None Symptoms- loud snoring, choking, stops breathing during sleep, restless sleep, daytime sleepiness Bedtime- 10pm-12am  Nocturnal awakenings- 5-10 times Out of bed- 5:30am/ 8-10am Weight- up 10 lbs  Epworth- 9  No Known Allergies  Immunization History  Administered Date(s) Administered   Influenza-Unspecified 07/29/2017, 07/25/2018, 07/28/2019, 07/03/2021   PFIZER(Purple Top)SARS-COV-2 Vaccination 08/13/2020   Tdap 04/15/2016    Past Medical History:  Diagnosis Date   ADD (attention deficit disorder)    Asthma    Dislocation of left patella 12/22/2018   GERD (gastroesophageal reflux disease)    Herpes simplex    Hypertension    Varicose veins     Tobacco History: Social History   Tobacco Use  Smoking Status Never  Smokeless Tobacco Never   Counseling given: Not Answered   Outpatient Medications Prior to Visit  Medication Sig Dispense Refill   albuterol (VENTOLIN HFA) 108 (90 Base) MCG/ACT inhaler Inhale  2 puffs into the lungs every 6 (six) hours as needed for wheezing or shortness of breath. 18 g 2   cetirizine (ZYRTEC) 10 MG tablet Take 10 mg by mouth daily as needed for allergies.     esomeprazole (NEXIUM) 40 MG capsule Take 1 capsule (40 mg total) by mouth 2 (two) times daily before a meal. 60 capsule 2   losartan (COZAAR) 50 MG tablet TAKE 1 TABLET BY MOUTH 2 (TWO) TIMES DAILY. 180 tablet 3   predniSONE (DELTASONE) 20 MG tablet Take 2 tablets (40 mg total) by mouth daily. For 3 days and 1 tab daily for 3 days. 9 tablet 0   tiZANidine (ZANAFLEX) 4 MG capsule Take 1 capsule (4 mg total) by mouth 3 (three) times daily as needed for muscle spasms. 90 capsule 0   valACYclovir (VALTREX) 1000 MG tablet Take 1 tablet (1,000 mg total) by mouth daily. 90 tablet 0   fluticasone-salmeterol (ADVAIR) 100-50 MCG/ACT AEPB Inhale 1 puff into the lungs 2 (two) times daily. 60 each 11   No facility-administered medications prior to visit.    Review of Systems  Review of Systems  Constitutional: Negative.   HENT: Negative.    Respiratory:  Positive for chest tightness and shortness of breath.     Physical Exam  BP 140/88 (BP Location: Left Arm, Patient Position: Sitting, Cuff Size: Normal)    Pulse 93    Temp 98.2 F (36.8 C) (Oral)    Ht 5\' 7"  (1.702 m)    Wt 193 lb 12.8 oz (87.9 kg)  SpO2 97%    BMI 30.35 kg/m  Physical Exam Constitutional:      Appearance: Normal appearance.  HENT:     Head: Normocephalic and atraumatic.     Mouth/Throat:     Mouth: Mucous membranes are moist.     Pharynx: Oropharynx is clear.  Cardiovascular:     Rate and Rhythm: Normal rate and regular rhythm.  Pulmonary:     Effort: Pulmonary effort is normal.     Breath sounds: Normal breath sounds. No wheezing, rhonchi or rales.  Musculoskeletal:        General: Normal range of motion.  Skin:    General: Skin is warm and dry.  Neurological:     General: No focal deficit present.     Mental Status: He is alert  and oriented to person, place, and time. Mental status is at baseline.  Psychiatric:        Mood and Affect: Mood normal.        Behavior: Behavior normal.        Thought Content: Thought content normal.        Judgment: Judgment normal.     Lab Results:  CBC    Component Value Date/Time   WBC 8.8 07/24/2021 1108   RBC 5.48 07/24/2021 1108   HGB 16.8 07/24/2021 1108   HCT 48.2 07/24/2021 1108   PLT 283.0 07/24/2021 1108   MCV 87.9 07/24/2021 1108   MCHC 34.8 07/24/2021 1108   RDW 13.6 07/24/2021 1108   LYMPHSABS 2.8 07/24/2021 1108   MONOABS 0.9 07/24/2021 1108   EOSABS 0.1 07/24/2021 1108   BASOSABS 0.0 07/24/2021 1108    BMET    Component Value Date/Time   NA 138 07/24/2021 1108   K 4.0 07/24/2021 1108   CL 104 07/24/2021 1108   CO2 24 07/24/2021 1108   GLUCOSE 86 07/24/2021 1108   BUN 9 07/24/2021 1108   CREATININE 0.95 07/24/2021 1108   CALCIUM 9.7 07/24/2021 1108   GFRNONAA 105.43 04/05/2009 0933   GFRAA 130 11/29/2006 1022    BNP No results found for: BNP  ProBNP No results found for: PROBNP  Imaging: No results found.   Assessment & Plan:   At risk for sleep apnea - Patient has symptoms of loud snoring, witnessed apnea, restless sleep and daytime sleepiness. Epworth 9/24. I have a high suspicion patient has underlying sleep apnea, need home sleep study to evaluate for OSA. We reviewed risk of untreated sleep apnea including cardiac arrhythmias, pulm HTN, stroke or DM. We also discussed treatment options including weight loss, oral appliance, CPAP therapy or referral to ENT for possible surgical options. Advised patient to focus on side sleeping position and avoid excessive alcohol/sedating medication prior to bedtime. FU in 6 weeks to review results and treatment options further.   Mild intermittent asthma with acute exacerbation - Increased sob symptoms since having covid. Using SABA 5-6 times a week. Recommend increasing Advair 250-16mcg 1 puff twice  daily and getting updated PFTs. Continue Cetirizine. Consider adding Singulair if having more allergic rhinitis symptoms.    Martyn Ehrich, NP 11/07/2021

## 2021-11-17 ENCOUNTER — Other Ambulatory Visit: Payer: Self-pay

## 2021-11-18 ENCOUNTER — Other Ambulatory Visit: Payer: Self-pay

## 2021-11-19 ENCOUNTER — Other Ambulatory Visit: Payer: Self-pay

## 2021-11-27 ENCOUNTER — Ambulatory Visit (INDEPENDENT_AMBULATORY_CARE_PROVIDER_SITE_OTHER): Payer: 59 | Admitting: Internal Medicine

## 2021-11-27 ENCOUNTER — Ambulatory Visit: Payer: 59 | Attending: Otolaryngology

## 2021-11-27 ENCOUNTER — Encounter: Payer: Self-pay | Admitting: Internal Medicine

## 2021-11-27 VITALS — BP 126/82 | HR 85 | Ht 67.0 in | Wt 194.0 lb

## 2021-11-27 DIAGNOSIS — Z6830 Body mass index (BMI) 30.0-30.9, adult: Secondary | ICD-10-CM | POA: Insufficient documentation

## 2021-11-27 DIAGNOSIS — K649 Unspecified hemorrhoids: Secondary | ICD-10-CM | POA: Diagnosis not present

## 2021-11-27 DIAGNOSIS — Z86010 Personal history of colonic polyps: Secondary | ICD-10-CM | POA: Diagnosis not present

## 2021-11-27 DIAGNOSIS — K2990 Gastroduodenitis, unspecified, without bleeding: Secondary | ICD-10-CM

## 2021-11-27 DIAGNOSIS — R5383 Other fatigue: Secondary | ICD-10-CM | POA: Insufficient documentation

## 2021-11-27 DIAGNOSIS — K2970 Gastritis, unspecified, without bleeding: Secondary | ICD-10-CM

## 2021-11-27 DIAGNOSIS — G4700 Insomnia, unspecified: Secondary | ICD-10-CM | POA: Diagnosis not present

## 2021-11-27 NOTE — Progress Notes (Addendum)
Chief Complaint: Diarrhea, rectal bleeding, abdominal pain  HPI : 41 year old male with history of asthma, GERD, ADHD, and HTN presents with follow up of epigastric ab pain  He works as a Airline pilot at [employer].   Interval History: He has been increasing his fiber intake to try to help with his bowel habits. He was eating about 3 grams of fiber per day and has increased this to 20 g of fiber per day. The frequency of his BMs decreased significantly with the increase fiber intake. He has been having on average 1-2 BMs per day. He has backed off on the probiotics, which he thinks was also contributing to diarrhea. He has been still seeing bright red blood on occasion, but this is not bothersome to him. Denies rectal pain. He did try the topical steroids for hemorrhoids but then the bleeding restarted. He is taking Nexium BID, currently on his 8 week course. He has been staying away from spicy foods. He feels the combination of Nexium and dietary changes have helped with his ab pain. Currently he denies epigastric ab pain. Denies chest burning or regurgitation.  He has now seeing a pulmonologist for his issues with apnea that were noted during his recent colonoscopy.  Wt Readings from Last 3 Encounters:  11/27/21 194 lb (88 kg)  11/07/21 193 lb 12.8 oz (87.9 kg)  10/03/21 187 lb (84.8 kg)   Current Outpatient Medications  Medication Sig Dispense Refill   albuterol (VENTOLIN HFA) 108 (90 Base) MCG/ACT inhaler Inhale 2 puffs into the lungs every 6 (six) hours as needed for wheezing or shortness of breath. 18 g 2   cetirizine (ZYRTEC) 10 MG tablet Take 10 mg by mouth daily as needed for allergies.     esomeprazole (NEXIUM) 40 MG capsule Take 1 capsule (40 mg total) by mouth 2 (two) times daily before a meal. 60 capsule 2   fluticasone-salmeterol (ADVAIR DISKUS) 250-50 MCG/ACT AEPB Inhale 1 puff into the lungs in the morning and at bedtime. 60 each 5   losartan (COZAAR) 50 MG tablet TAKE 1  TABLET BY MOUTH 2 (TWO) TIMES DAILY. 180 tablet 3   tiZANidine (ZANAFLEX) 4 MG capsule Take 1 capsule (4 mg total) by mouth 3 (three) times daily as needed for muscle spasms. 90 capsule 0   valACYclovir (VALTREX) 1000 MG tablet Take 1 tablet (1,000 mg total) by mouth daily. 90 tablet 0   No current facility-administered medications for this visit.   Review of Systems: All systems reviewed and negative except where noted in HPI.   Physical Exam: BP 126/82    Pulse 85    Ht 5\' 7"  (1.702 m)    Wt 194 lb (88 kg)    SpO2 97%    BMI 30.38 kg/m  Constitutional: Pleasant,well-developed, male in no acute distress. HEENT: Normocephalic and atraumatic. Conjunctivae are normal. No scleral icterus. Cardiovascular: Normal rate, regular rhythm.  Pulmonary/chest: Effort normal and breath sounds normal. No wheezing, rales or rhonchi. Abdominal: Soft, nondistended, nontender. Bowel sounds active throughout. Extremities: No edema Neurological: Alert and oriented to person place and time. Skin: Skin is warm and dry. No rashes noted. Psychiatric: Normal mood and affect. Behavior is normal.  Labs 05/2020: TSH nml.  Labs 07/2021: Positive FOBT, giardia and crypto are negative. Fecal calprotectin negative. O&P negative. HCV antibody negative. CMP nml. CBC nml. Celiac panel negative.  Labs 08/2021: Nml CRP  Labs 09/2021: GI profile positive for norovirus. C dif neg.  EGD 09/29/21: -  The examined esophagus was normal. - A hiatal hernia was present. - Scattered inflammation characterized by adherent blood, erosions and erythema was found in the gastric body. Biopsies were taken with a cold forceps for histology. - The examined duodenum was normal. Biopsies for histology were taken with a cold forceps for evaluation of celiac disease. Path: 1. Surgical [P], duodenum - DUODENAL MUCOSA WITH NO SPECIFIC HISTOPATHOLOGIC CHANGES - NEGATIVE FOR INCREASED INTRAEPITHELIAL LYMPHOCYTES OR VILLOUS ARCHITECTURAL  CHANGES 2. Surgical [P], gastric - GASTRIC ANTRAL AND OXYNTIC MUCOSA WITH NO SPECIFIC HISTOPATHOLOGIC CHANGES - WARTHIN STARRY STAIN IS NEGATIVE FOR HELICOBACTER PYLORI  Colonoscopy 09/29/21: - Multiple small and large-mouthed diverticula were found in the entire colon. - A 2 mm polyp was found in the cecum. The polyp was sessile. The polyp was removed with a jumbo cold forceps. Resection and retrieval were complete. - Three sessile polyps were found in the transverse colon. The polyps were 3 to 5 mm in size. These polyps were removed with a cold snare. Resection and retrieval were complete. - Non-bleeding internal hemorrhoids were found during retroflexion. - Biopsies were taken with a cold forceps in the entire colon for histology. 3. 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 Surgical [P], colon nos, random sites - COLONIC MUCOSA WITH NO SPECIFIC HISTOPATHOLOGIC CHANGES - NEGATIVE FOR ACUTE INFLAMMATION, INCREASED INTRAEPITHELIAL LYMPHOCYTES OR THICKENED SUBEPITHELIAL COLLAGEN TABLE  ASSESSMENT AND PLAN: Gastritis GERD History of colon polyps Patient has had a dramatic improvement in his GERD and epigastric ab pain (likely due to gastritis) on Nexium twice daily therapy.  Will have him attempt to taper down on the Nexium to daily after he completes his 8 weeks of Nexium twice daily. He has also had resolution of his diarrhea by increasing his dietary fiber intake.  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  Colon biopsies from his colonoscopy were negative for lymphocytic colitis.  He did have several tubular adenomas that were removed, and will need to be followed for polyp surveillance. - Continue Nexium BID to complete 8  weeks, then decrease Nexium QD. If has recurrence in epigastric ab pain or GERD, then can increase back up to BID - Continue fiber - Repeat colonoscopy in 09/2024 for polyp surveillance - RTC in 6 months  Christia Reading, MD  I spent 31 minutes of time, including in depth chart review, independent review of results as outlined above, communicating results with the patient directly, face-to-face time with the patient, coordinating care, and ordering studies and medications as appropriate, and documentation.

## 2021-11-27 NOTE — Patient Instructions (Signed)
Finish Nexium Twice daily for 8 weeks then decrease to 1 a day  Follow up in 6 months  If you are age 43 or older, your body mass index should be between 23-30. Your Body mass index is 30.38 kg/m. If this is out of the aforementioned range listed, please consider follow up with your Primary Care Provider.  If you are age 45 or younger, your body mass index should be between 19-25. Your Body mass index is 30.38 kg/m. If this is out of the aformentioned range listed, please consider follow up with your Primary Care Provider.   ________________________________________________________  The [facility] providers would like to encourage you to use [facility] to communicate with providers for non-urgent requests or questions.  Due to long hold times on the telephone, sending your provider a message by [facility] may be a faster and more efficient way to get a response.  Please allow 48 business hours for a response.  Please remember that this is for non-urgent requests.  _______________________________________________________   I appreciate the  opportunity to care for you  Thank You   Dayna Barker, MD

## 2021-12-01 ENCOUNTER — Other Ambulatory Visit (HOSPITAL_COMMUNITY): Payer: Self-pay

## 2021-12-08 ENCOUNTER — Other Ambulatory Visit: Payer: Self-pay

## 2021-12-09 ENCOUNTER — Other Ambulatory Visit: Payer: Self-pay

## 2021-12-10 ENCOUNTER — Other Ambulatory Visit: Payer: Self-pay

## 2021-12-11 ENCOUNTER — Other Ambulatory Visit (HOSPITAL_COMMUNITY): Payer: Self-pay

## 2021-12-11 ENCOUNTER — Other Ambulatory Visit: Payer: Self-pay

## 2021-12-11 ENCOUNTER — Other Ambulatory Visit
Admission: RE | Admit: 2021-12-11 | Discharge: 2021-12-11 | Disposition: A | Discharge status: Home / Self Care | Payer: 59 | Source: Ambulatory Visit | Attending: Primary Care | Admitting: Primary Care

## 2021-12-11 DIAGNOSIS — Z01812 Encounter for preprocedural laboratory examination: Secondary | ICD-10-CM | POA: Diagnosis present

## 2021-12-11 DIAGNOSIS — Z20822 Contact with and (suspected) exposure to covid-19: Secondary | ICD-10-CM | POA: Diagnosis not present

## 2021-12-12 ENCOUNTER — Ambulatory Visit: Payer: 59 | Attending: Primary Care

## 2021-12-12 DIAGNOSIS — J4530 Mild persistent asthma, uncomplicated: Secondary | ICD-10-CM | POA: Diagnosis present

## 2021-12-12 LAB — SARS CORONAVIRUS 2 (TAT 6-24 HRS): SARS Coronavirus 2: NEGATIVE

## 2021-12-12 MED ORDER — ALBUTEROL SULFATE (2.5 MG/3ML) 0.083% IN NEBU
2.5000 mg | INHALATION_SOLUTION | Freq: Once | RESPIRATORY_TRACT | Status: AC
  Administered 2021-12-12: 2.5 mg via RESPIRATORY_TRACT
  Filled 2021-12-12: qty 3

## 2021-12-15 NOTE — Telephone Encounter (Signed)
Created in error

## 2021-12-16 NOTE — Progress Notes (Signed)
PFTs without evidence of obstructive or restrictive lung disease. We will review at follow-up on 3/1

## 2021-12-18 ENCOUNTER — Other Ambulatory Visit: Payer: Self-pay

## 2021-12-31 ENCOUNTER — Encounter: Payer: Self-pay | Admitting: Primary Care

## 2021-12-31 ENCOUNTER — Other Ambulatory Visit: Payer: Self-pay

## 2021-12-31 ENCOUNTER — Ambulatory Visit (INDEPENDENT_AMBULATORY_CARE_PROVIDER_SITE_OTHER): Payer: 59 | Admitting: Primary Care

## 2021-12-31 VITALS — BP 130/72 | HR 82 | Temp 98.1°F | Ht 67.0 in | Wt 190.8 lb

## 2021-12-31 DIAGNOSIS — G4733 Obstructive sleep apnea (adult) (pediatric): Secondary | ICD-10-CM | POA: Diagnosis not present

## 2021-12-31 DIAGNOSIS — J4521 Mild intermittent asthma with (acute) exacerbation: Secondary | ICD-10-CM | POA: Diagnosis not present

## 2021-12-31 NOTE — Assessment & Plan Note (Addendum)
-   Improved with increased Advair 250-80mcg one puff twice daily ?- PFTs showed normal spirometry, no restrictive or obstructive defect. FEV1 97%. No BD response, mid-flow reversibility  ?- Continue Zyrtec 10mg  daily  ?

## 2021-12-31 NOTE — Progress Notes (Signed)
Reviewed and agree with assessment/plan. ? ? ?Chesley Mires, MD ?[place] ?12/31/2021, 11:52 AM ?Pager:  [phone] ? ?

## 2021-12-31 NOTE — Progress Notes (Signed)
? ?@Patient  ID: Bradley Schaefer, male    DOB: [birth year], 41 y.o.   MRN: [id number] ? ?Chief Complaint  ?Patient presents with  ? Follow-up  ? ? ?Referring provider: ?Lesleigh Noe, MD ? ?HPI: ?41 year old male, never smoked. PMH significant for HTN, mild intermittent asthma, ADHD.  ? ?Previous [facility] pulmonary encounter:  ?11/07/2021 ?Patient presents today for sleep consult. He has symptoms of loud snoring, witnessed apnea, restless sleep and daytime sleepiness. Symptoms are worse since having covid in March 2022. He works day shift as a Marine scientist. He had a colonoscopy recently and had issues to apnea. Typical bedtime is between 10pm-12am. He wakes up on average 5-10 times a night.  ? ?He saw his PCP on 10/03/21 for mild intermittent asthma symptoms, treated with 6 day course of prednisone. Maintained on Advair 177mcg 1 puff twice daily and cetirizine. He has noticed increased shortness of breath as well since having covid. He uses his albuterol rescue inhaler 5-6 times a week.  ? ?Sleep questionnaire: ?Prior sleep study- None ?Symptoms- loud snoring, choking, stops breathing during sleep, restless sleep, daytime sleepiness ?Bedtime- 10pm-12am  ?Nocturnal awakenings- 5-10 times ?Out of bed- 5:30am/ 8-10am ?Weight- up 10 lbs  ?Epworth- 9 ? ?12/31/2021- interim hx  ?Patient presents today to review sleep study results/Pulmonary function testing. Patient had sleep study with [facility] on 11/27/21 which showed moderate-severe OSA, AHI 26.6/hr with SpO2 low 75%. Discussed risk of untreated sleep apnea and treatment options. He is open to starting CPAP since his sleep has worsened and he continues to have daytime fatigue. He is maintained on Adviar 250-47mcg one puffs morning and evening for asthma. PFTs were normal. Feels higher dose ICS inhaler has helps his breathing, not having as much shortness of breath and using Albuterol less.  ? ? ? ?No Known Allergies ? ?Immunization History  ?Administered Date(s) Administered  ?  Influenza-Unspecified 07/29/2017, 07/25/2018, 07/28/2019, 07/03/2021  ? PFIZER(Purple Top)SARS-COV-2 Vaccination 08/13/2020  ? Tdap 04/15/2016  ? ? ?Past Medical History:  ?Diagnosis Date  ? ADD (attention deficit disorder)   ? Asthma   ? Dislocation of left patella 12/22/2018  ? GERD (gastroesophageal reflux disease)   ? Herpes simplex   ? Hypertension   ? Varicose veins   ? ? ?Tobacco History: ?Social History  ? ?Tobacco Use  ?Smoking Status Never  ?Smokeless Tobacco Never  ? ?Counseling given: Not Answered ? ? ?Outpatient Medications Prior to Visit  ?Medication Sig Dispense Refill  ? albuterol (VENTOLIN HFA) 108 (90 Base) MCG/ACT inhaler Inhale 2 puffs into the lungs every 6 (six) hours as needed for wheezing or shortness of breath. 18 g 2  ? cetirizine (ZYRTEC) 10 MG tablet Take 10 mg by mouth daily as needed for allergies.    ? esomeprazole (NEXIUM) 40 MG capsule Take 1 capsule (40 mg total) by mouth 2 (two) times daily before a meal. 60 capsule 2  ? fluticasone-salmeterol (ADVAIR DISKUS) 250-50 MCG/ACT AEPB Inhale 1 puff into the lungs in the morning and at bedtime. 60 each 5  ? losartan (COZAAR) 50 MG tablet TAKE 1 TABLET BY MOUTH 2 (TWO) TIMES DAILY. 180 tablet 3  ? tiZANidine (ZANAFLEX) 4 MG capsule Take 1 capsule (4 mg total) by mouth 3 (three) times daily as needed for muscle spasms. 90 capsule 0  ? valACYclovir (VALTREX) 1000 MG tablet Take 1 tablet (1,000 mg total) by mouth daily. 90 tablet 0  ? ?No facility-administered medications prior to visit.  ? ?Review of Systems ? ?  Review of Systems  ?Constitutional:  Positive for fatigue.  ?HENT: Negative.    ?Respiratory: Negative.    ?Cardiovascular: Negative.   ?Psychiatric/Behavioral: Negative.    ? ? ?Physical Exam ? ?BP 130/72 (BP Location: Left Arm, Patient Position: Sitting, Cuff Size: Normal)   Pulse 82   Temp 98.1 ?F (36.7 ?C) (Oral)   Ht 5\' 7"  (1.702 m)   Wt 190 lb 12.8 oz (86.5 kg)   SpO2 98%   BMI 29.88 kg/m?  ?Physical Exam ?Constitutional:    ?   Appearance: Normal appearance.  ?HENT:  ?   Head: Normocephalic and atraumatic.  ?   Mouth/Throat:  ?   Comments: Deferred d/t masking ?Cardiovascular:  ?   Rate and Rhythm: Normal rate and regular rhythm.  ?Pulmonary:  ?   Effort: Pulmonary effort is normal.  ?   Breath sounds: Normal breath sounds.  ?Musculoskeletal:     ?   General: Normal range of motion.  ?Skin: ?   General: Skin is warm and dry.  ?Neurological:  ?   General: No focal deficit present.  ?   Mental Status: He is alert and oriented to person, place, and time. Mental status is at baseline.  ?Psychiatric:     ?   Mood and Affect: Mood normal.     ?   Behavior: Behavior normal.     ?   Thought Content: Thought content normal.     ?   Judgment: Judgment normal.  ?  ? ?Lab Results: ? ?CBC ?   ?Component Value Date/Time  ? WBC 8.8 07/24/2021 1108  ? RBC 5.48 07/24/2021 1108  ? HGB 16.8 07/24/2021 1108  ? HCT 48.2 07/24/2021 1108  ? PLT 283.0 07/24/2021 1108  ? MCV 87.9 07/24/2021 1108  ? MCHC 34.8 07/24/2021 1108  ? RDW 13.6 07/24/2021 1108  ? LYMPHSABS 2.8 07/24/2021 1108  ? MONOABS 0.9 07/24/2021 1108  ? EOSABS 0.1 07/24/2021 1108  ? BASOSABS 0.0 07/24/2021 1108  ? ? ?BMET ?   ?Component Value Date/Time  ? NA 138 07/24/2021 1108  ? K 4.0 07/24/2021 1108  ? CL 104 07/24/2021 1108  ? CO2 24 07/24/2021 1108  ? GLUCOSE 86 07/24/2021 1108  ? BUN 9 07/24/2021 1108  ? CREATININE 0.95 07/24/2021 1108  ? CALCIUM 9.7 07/24/2021 1108  ? GFRNONAA 105.43 04/05/2009 0933  ? GFRAA 130 11/29/2006 1022  ? ? ?BNP ?No results found for: BNP ? ?ProBNP ?No results found for: PROBNP ? ?Imaging: ?Pulmonary Function Test [facility] Only ? ?Result Date: 12/15/2021 ?Spirometry Data Is Acceptable and Reproducible No obvious evidence of Obstructive Airways disease or Restrictive Lung disease Consider outpatient follow up with Pulmonary if needed. Clinical Correlation Advised  ? ?SLEEP STUDY DOCUMENTS ? ?Result Date: 12/19/2021 ?Ordered by an unspecified provider.  ? ? ?Assessment &  Plan:  ? ?OSA (obstructive sleep apnea) ?- Sleep study 11/27/21 showed moderate-severe OSA, AHI 26.6/hr with SpO2 low 74% ?- Discussed treatment options, patient would like to start CPAP d.t severity of OSA and daytime fatigue  ?- Order auto titrate 5-15cm 2h0 with mask of choice ?- Advised patient to aim to wear CPAP every night min 4-6 hours or longer; No driving if experiencing excessive daytime sleepiness  ?- FU in 31-90 days for compliance check  ? ?Mild intermittent asthma with acute exacerbation ?- Improved with increased Advair 250-60mcg one puff twice daily ?- PFTs showed normal spirometry, no restrictive or obstructive defect. FEV1 97%. No BD response, mid-flow reversibility  ?-  Continue Zyrtec 10mg  daily  ? ? ?Martyn Ehrich, NP ?12/31/2021 ? ?

## 2021-12-31 NOTE — Patient Instructions (Addendum)
Sleep study on 1.26.23 showed moderate-severe OSA, AHI 26.6/hr with SpO2 low 75%.  ? ?Treatment options include weight loss measures, side sleeping position, oral appliance, CPAP therapy ? ?Untreated sleep apnea puts you at higher risk for cardia arrhthymias, afib, stroke, pulmonary HTN, diabetes ? ?Recommendations: ?Continue Advair 250-50 one puffs twice daily ?Continue Zyrtec 10mg  daily ?Continue Ventolin 2 puffs every 4-6 hours for breakthrough shortness of breath ?Aim to wear CPAP every night min 4-6 hours or longer; do not drive if experiencing excessive daytime sleepiness ? ?Orders: ?Auto CPAP 5-15cm h20 with mask of choice ? ?Follow-up: ?Please call to set up visit after receiving CPAP 31-90 days for compliance reports  ? ?CPAP and BIPAP Information ?CPAP and BIPAP are methods that use air pressure to keep your airways open and to help you breathe well. CPAP and BIPAP use different amounts of pressure. Your health care provider will tell you whether CPAP or BIPAP would be more helpful for you. ?CPAP stands for "continuous positive airway pressure." With CPAP, the amount of pressure stays the same while you breathe in (inhale) and out (exhale). ?BIPAP stands for "bi-level positive airway pressure." With BIPAP, the amount of pressure will be higher when you inhale and lower when you exhale. This allows you to take larger breaths. ?CPAP or BIPAP may be used in the hospital, or your health care provider may want you to use it at home. You may need to have a sleep study before your health care provider can order a machine for you to use at home. ?What are the advantages? ?CPAP or BIPAP can be helpful if you have: ?Sleep apnea. ?Chronic obstructive pulmonary disease (COPD). ?Heart failure. ?Medical conditions that cause muscle weakness, including muscular dystrophy or amyotrophic lateral sclerosis (ALS). ?Other problems that cause breathing to be shallow, weak, abnormal, or difficult. ?CPAP and BIPAP are most  commonly used for obstructive sleep apnea (OSA) to keep the airways from collapsing when the muscles relax during sleep. ?What are the risks? ?Generally, this is a safe treatment. However, problems may occur, including: ?Irritated skin or skin sores if the mask does not fit properly. ?Dry or stuffy nose or nosebleeds. ?Dry mouth. ?Feeling gassy or bloated. ?Sinus or lung infection if the equipment is not cleaned properly. ?When should CPAP or BIPAP be used? ?In most cases, the mask only needs to be worn during sleep. Generally, the mask needs to be worn throughout the night and during any daytime naps. People with certain medical conditions may also need to wear the mask at other times, such as when they are awake. Follow instructions from your health care provider about when to use the machine. ?What happens during CPAP or BIPAP? ?Both CPAP and BIPAP are provided by a small machine with a flexible plastic tube that attaches to a plastic mask that you wear. Air is blown through the mask into your nose or mouth. The amount of pressure that is used to blow the air can be adjusted on the machine. Your health care provider will set the pressure setting and help you find the best mask for you. ?Tips for using the mask ?Because the mask needs to be snug, some people feel trapped or closed-in (claustrophobic) when first using the mask. If you feel this way, you may need to get used to the mask. One way to do this is to hold the mask loosely over your nose or mouth and then gradually apply the mask more snugly. You can also gradually increase  the amount of time that you use the mask. ?Masks are available in various types and sizes. If your mask does not fit well, talk with your health care provider about getting a different one. Some common types of masks include: ?Full face masks, which fit over the mouth and nose. ?Nasal masks, which fit over the nose. ?Nasal pillow or prong masks, which fit into the nostrils. ?If you are  using a mask that fits over your nose and you tend to breathe through your mouth, a chin strap may be applied to help keep your mouth closed. ?Use a skin barrier to protect your skin as told by your health care provider. ?Some CPAP and BIPAP machines have alarms that may sound if the mask comes off or develops a leak. ?If you have trouble with the mask, it is very important that you talk with your health care provider about finding a way to make the mask easier to tolerate. Do not stop using the mask. There could be a negative impact on your health if you stop using the mask. ?Tips for using the machine ?Place your CPAP or BIPAP machine on a secure table or stand near an electrical outlet. ?Know where the on/off switch is on the machine. ?Follow instructions from your health care provider about how to set the pressure on your machine and when you should use it. ?Do not eat or drink while the CPAP or BIPAP machine is on. Food or fluids could get pushed into your lungs by the pressure of the CPAP or BIPAP. ?For home use, CPAP and BIPAP machines can be rented or purchased through home health care companies. Many different brands of machines are available. Renting a machine before purchasing may help you find out which particular machine works well for you. Your health insurance company may also decide which machine you may get. ?Keep the CPAP or BIPAP machine and attachments clean. Ask your health care provider for specific instructions. ?Check the humidifier if you have a dry stuffy nose or nosebleeds. Make sure it is working correctly. ?Follow these instructions at home: ?Take over-the-counter and prescription medicines only as told by your health care provider. Ask if you can take sinus medicine if your sinuses are blocked. ?Do not use any products that contain nicotine or tobacco. These products include cigarettes, chewing tobacco, and vaping devices, such as e-cigarettes. If you need help quitting, ask your health  care provider. ?Keep all follow-up visits. This is important. ?Contact a health care provider if: ?You have redness or pressure sores on your head, face, mouth, or nose from the mask or head gear. ?You have trouble using the CPAP or BIPAP machine. ?You cannot tolerate wearing the CPAP or BIPAP mask. ?Someone tells you that you snore even when wearing your CPAP or BIPAP. ?Get help right away if: ?You have trouble breathing. ?You feel confused. ?Summary ?CPAP and BIPAP are methods that use air pressure to keep your airways open and to help you breathe well. ?If you have trouble with the mask, it is very important that you talk with your health care provider about finding a way to make the mask easier to tolerate. Do not stop using the mask. There could be a negative impact to your health if you stop using the mask. ?Follow instructions from your health care provider about when to use the machine. ?This information is not intended to replace advice given to you by your health care provider. Make sure you discuss any questions  you have with your health care provider. ?Document Revised: 05/28/2021 Document Reviewed: 09/27/2020 ?Elsevier Patient Education ? [employer]. ? ?

## 2021-12-31 NOTE — Assessment & Plan Note (Signed)
-   Sleep study 11/27/21 showed moderate-severe OSA, AHI 26.6/hr with SpO2 low 74% ?- Discussed treatment options, patient would like to start CPAP d.t severity of OSA and daytime fatigue  ?- Order auto titrate 5-15cm 2h0 with mask of choice ?- Advised patient to aim to wear CPAP every night min 4-6 hours or longer; No driving if experiencing excessive daytime sleepiness  ?- FU in 31-90 days for compliance check  ?

## 2022-01-09 ENCOUNTER — Other Ambulatory Visit: Payer: Self-pay

## 2022-01-13 ENCOUNTER — Other Ambulatory Visit: Payer: Self-pay

## 2022-02-09 ENCOUNTER — Other Ambulatory Visit: Payer: Self-pay

## 2022-02-24 ENCOUNTER — Other Ambulatory Visit: Payer: Self-pay

## 2022-02-24 ENCOUNTER — Encounter: Payer: Self-pay | Admitting: Internal Medicine

## 2022-02-24 DIAGNOSIS — K219 Gastro-esophageal reflux disease without esophagitis: Secondary | ICD-10-CM

## 2022-02-24 MED ORDER — ESOMEPRAZOLE MAGNESIUM 40 MG PO CPDR
40.0000 mg | DELAYED_RELEASE_CAPSULE | Freq: Every day | ORAL | 0 refills | Status: DC
  Filled 2022-02-24: qty 90, 90d supply, fill #0

## 2022-03-11 ENCOUNTER — Other Ambulatory Visit (HOSPITAL_COMMUNITY): Payer: Self-pay

## 2022-03-19 ENCOUNTER — Other Ambulatory Visit: Payer: Self-pay

## 2022-04-05 ENCOUNTER — Encounter: Payer: Self-pay | Admitting: Family Medicine

## 2022-04-08 ENCOUNTER — Encounter: Payer: Self-pay | Admitting: Family Medicine

## 2022-04-08 ENCOUNTER — Ambulatory Visit (INDEPENDENT_AMBULATORY_CARE_PROVIDER_SITE_OTHER)
Admission: RE | Admit: 2022-04-08 | Discharge: 2022-04-08 | Disposition: A | Discharge status: Home / Self Care | Payer: 59 | Source: Ambulatory Visit | Attending: Family Medicine | Admitting: Family Medicine

## 2022-04-08 ENCOUNTER — Ambulatory Visit: Payer: 59 | Admitting: Family Medicine

## 2022-04-08 VITALS — BP 120/80 | HR 84 | Temp 98.2°F | Ht 67.0 in | Wt 190.0 lb

## 2022-04-08 DIAGNOSIS — S83004A Unspecified dislocation of right patella, initial encounter: Secondary | ICD-10-CM

## 2022-04-08 DIAGNOSIS — M2391 Unspecified internal derangement of right knee: Secondary | ICD-10-CM | POA: Diagnosis not present

## 2022-04-08 DIAGNOSIS — M25561 Pain in right knee: Secondary | ICD-10-CM | POA: Diagnosis not present

## 2022-04-08 NOTE — Progress Notes (Signed)
Bradley Gracey T. Shraddha Lebron, MD, [place] at [facility] [place], [address]  Phone: [phone]  FAX: [phone]  Bradley Schaefer - 41 y.o. male  MRN [id number]  Date of Birth: 1981/08/28  Date: 04/08/2022  PCP: Lesleigh Noe, MD  Referral: Lesleigh Noe, MD  Chief Complaint  Patient presents with   Knee Pain    Dislocation of Right Knee Cap   Subjective:   Bradley Schaefer is a 41 y.o. very pleasant male patient with Body mass index is 29.76 kg/m. who presents with the following:  The patient has a very good history of R sided patellar dislocation 4 days ago.  He was moving around his [employer], and then had a lateral dislocation of the patella, where the patellar moved laterally completely off of the trochlear groove.  He was in some fairly dramatic pain, but he then reduced the dislocation on his own at the time of the injury.  3 years ago, he dislocated his L patella.  This was managed conservatively and non-operatively, and he did well.  He saw Dr. Rosiland Oz at that time, and he did well in a patellar-J type brace.  01/2019 MRI of the R knee: CLINICAL DATA:  Right knee pain with locking.   EXAM: MRI OF THE RIGHT KNEE WITHOUT CONTRAST   TECHNIQUE: Multiplanar, multisequence MR imaging of the knee was performed. No intravenous contrast was administered.   COMPARISON:  Radiographs dated 12/28/2018 and MRI dated 09/07/2012   FINDINGS: MENISCI   Medial meniscus:  Normal.   Lateral meniscus:  Normal.   LIGAMENTS   Cruciates: PCL is normal. There is diffuse progressive mucoid degeneration of the anterior cruciate ligament with slight degenerative changes in the femur and tibia at the ACL origin and insertion respectively.   Collaterals:  Normal.   CARTILAGE   Patellofemoral: Normal. Again noted is a shallow trochlear groove with slight lateral subluxation of the patella, unchanged.   Medial:  Normal.    Lateral:  Normal.   Joint: Minimal effusion, essentially unchanged. Hoffa's fat pad is normal. No plical thickening.   Popliteal Fossa: Tiny Baker's cyst, essentially unchanged. Popliteus tendon is normal.   Extensor Mechanism:  Normal.   Bones:  No significant abnormality.   Other: None   IMPRESSION: 1. 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 2. Minimal joint effusion, chronic. 3. 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     Electronically Signed   By: Lorriane Shire M.D.   On: 01/10/2019 09:54     Result History   Review of Systems is noted in the HPI, as appropriate  Patient Active Problem List   Diagnosis Date Noted   OSA (obstructive sleep apnea) 12/31/2021   Allergic rhinitis due to pollen 10/03/2021   At risk for sleep apnea 10/03/2021   Bloody diarrhea 07/24/2021   Chronic left shoulder pain 07/24/2021   Essential hypertension 05/02/2020   Alcohol use 03/19/2020   Gastroesophageal reflux disease 03/19/2020   Chronic pain of right knee 12/28/2018   ED (erectile dysfunction) 05/02/2018   Herpes genitalis in men 04/05/2009   Attention deficit hyperactivity disorder (ADHD) 04/05/2009   INSOMNIA 03/22/2008   Mild intermittent asthma with acute exacerbation 02/25/2008   ALLERGY 02/25/2008    Past Medical History:  Diagnosis Date   ADD (attention deficit disorder)    Asthma    Dislocation of left patella 12/22/2018   GERD (gastroesophageal reflux disease)  Herpes simplex    Hypertension    Varicose veins     Past Surgical History:  Procedure Laterality Date   ENDOVENOUS ABLATION SAPHENOUS VEIN W/ LASER Left 06/19/2020   endovenous laser ablation left greater saphenous vein and stab phlebectomy > 20 incisions left leg by [name] MD    TYMPANOSTOMY TUBE PLACEMENT      Family History  Problem Relation Age of Onset   Crohn's disease Mother    Diabetes Mother    Squamous cell  carcinoma Mother    Basal cell carcinoma Mother    Hypertension Father    GER disease Father    Asthma Father    Lung disease Maternal Grandmother        fibrotic disease   Brain cancer Maternal Grandfather 26   Lung cancer Paternal Grandmother    Bladder Cancer Paternal Grandfather    Colon cancer Neg Hx    Stomach cancer Neg Hx    Pancreatic cancer Neg Hx     Social History   Social History Narrative   05/02/20   From: [place] originally, moved to [facility] in college and parents moved to the area   Living: with wife, Cyril Mourning (2014) and 3 mixed family   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 PULM: Breathing comfortably in no respiratory distress PSYCH: Normally interactive.    Knee:  R Gait: markedly antalgic ROM: he lacks 5 deg ext, and flexes knee to 90 deg Effusion: moderate Echymosis or edema: none Patellar tendon ttp Painful PLICA: neg Patellar grind: pain unable to fully assess Medial and lateral patellar facet loading: pain Severe patellar apprehension medial and lateral joint lines: both tender Mcmurray's pos Flexion-pinch pos Varus and valgus stress: stable Lachman: question endpoint with some laxity Post drawere is neg Hip abduction, IR, ER: WNL Hip flexion str: 4/5 Hip abd: 5/5 Quad: 3+/5 VMO atrophy:No Hamstring concentric and eccentric: 5/5   Laboratory and Imaging Data: DG Knee Complete 4 Views  Right  Result Date: 04/08/2022 CLINICAL DATA:  Right patellar dislocation 4 days ago. Assess for fracture. EXAM: RIGHT KNEE - COMPLETE 4+ VIEW COMPARISON:  Right knee radiographs 12/28/2018; MRI right knee 01/09/2019 FINDINGS: On frontal view the patella is again mildly laterally positioned with respect of the trochlear notch. No definite acute fracture line is seen. Moderate joint effusion is new from 12/28/2018 radiographs. Joint spaces are preserved. IMPRESSION: 1. No acute fracture is seen. 2. The patella is again mildly laterally positioned with respect of the trochlear notch, increasing propensity for patellar instability. Electronically Signed   By: Yvonne Kendall M.D.   On: 04/08/2022 10:03     Assessment and Plan:     ICD-10-CM  1. Internal derangement of multiple sites of right knee  M23.91     2. Acute pain of right knee  M25.561 DG Knee Complete 4 Views Right    3. Closed patellar dislocation, right, initial encounter  S83.004A DG Knee Complete 4 Views Right     Large traumatic injury to the R knee with very clear patellar dislocation by patient who is an Therapist, sports. Marked functional impairment, pain, weight-bearing status.  Obtain an MRI of the R knee to assess for integrity of ligamentous structures stabilizing patella, occult fracture, and concern for possible ACL rupture with lax Lachman maneuver.  Diffusely abnormal exam as above.  F/u will depend on MRI findings.   Medication Management during today's office visit: No orders of the defined types were placed in this encounter.  There are no discontinued medications.  Orders placed today for conditions managed today: Orders Placed This Encounter  Procedures   DG Knee Complete 4 Views Right    Follow-up if needed: No follow-ups on file.  Dragon [employer] One speech-to-text software was used for transcription in this dictation.  Possible transcriptional errors can occur using Editor, commissioning.   Signed,  Maud Deed. Sheilah Rayos,  MD   Outpatient Encounter Medications as of 04/08/2022  Medication Sig   albuterol (VENTOLIN HFA) 108 (90 Base) MCG/ACT inhaler Inhale 2 puffs into the lungs every 6 (six) hours as needed for wheezing or shortness of breath.   cetirizine (ZYRTEC) 10 MG tablet Take 10 mg by mouth daily as needed for allergies.   esomeprazole (NEXIUM) 40 MG capsule Take 1 capsule (40 mg total) by mouth daily.   fluticasone-salmeterol (ADVAIR DISKUS) 250-50 MCG/ACT AEPB Inhale 1 puff into the lungs in the morning and at bedtime.   losartan (COZAAR) 50 MG tablet TAKE 1 TABLET BY MOUTH 2 (TWO) TIMES DAILY.   tiZANidine (ZANAFLEX) 4 MG capsule Take 1 capsule (4 mg total) by mouth 3 (three) times daily as needed for muscle spasms.   valACYclovir (VALTREX) 1000 MG tablet Take 1 tablet (1,000 mg total) by mouth daily.   No facility-administered encounter medications on file as of 04/08/2022.

## 2022-04-10 ENCOUNTER — Encounter: Payer: Self-pay | Admitting: Family Medicine

## 2022-04-13 ENCOUNTER — Other Ambulatory Visit: Payer: Self-pay

## 2022-04-26 ENCOUNTER — Other Ambulatory Visit: Payer: Self-pay

## 2022-04-27 ENCOUNTER — Other Ambulatory Visit: Payer: Self-pay | Admitting: Family Medicine

## 2022-04-27 ENCOUNTER — Encounter: Payer: Self-pay | Admitting: Family Medicine

## 2022-04-27 DIAGNOSIS — M2391 Unspecified internal derangement of right knee: Secondary | ICD-10-CM

## 2022-04-27 DIAGNOSIS — S83004A Unspecified dislocation of right patella, initial encounter: Secondary | ICD-10-CM

## 2022-04-27 DIAGNOSIS — M25561 Pain in right knee: Secondary | ICD-10-CM

## 2022-05-11 ENCOUNTER — Ambulatory Visit
Admission: RE | Admit: 2022-05-11 | Discharge: 2022-05-11 | Disposition: A | Discharge status: Home / Self Care | Payer: 59 | Source: Ambulatory Visit | Attending: Family Medicine | Admitting: Family Medicine

## 2022-05-11 DIAGNOSIS — M2391 Unspecified internal derangement of right knee: Secondary | ICD-10-CM

## 2022-05-11 DIAGNOSIS — M25561 Pain in right knee: Secondary | ICD-10-CM

## 2022-05-11 DIAGNOSIS — S83004A Unspecified dislocation of right patella, initial encounter: Secondary | ICD-10-CM

## 2022-05-14 ENCOUNTER — Encounter: Payer: Self-pay | Admitting: Family Medicine

## 2022-05-25 ENCOUNTER — Other Ambulatory Visit: Payer: Self-pay | Admitting: Internal Medicine

## 2022-05-25 ENCOUNTER — Other Ambulatory Visit: Payer: Self-pay

## 2022-05-25 DIAGNOSIS — K219 Gastro-esophageal reflux disease without esophagitis: Secondary | ICD-10-CM

## 2022-05-25 MED ORDER — ESOMEPRAZOLE MAGNESIUM 40 MG PO CPDR
40.0000 mg | DELAYED_RELEASE_CAPSULE | Freq: Every day | ORAL | 0 refills | Status: DC
  Filled 2022-05-25: qty 90, 90d supply, fill #0

## 2022-06-01 ENCOUNTER — Other Ambulatory Visit (HOSPITAL_COMMUNITY): Payer: Self-pay

## 2022-06-04 ENCOUNTER — Other Ambulatory Visit (HOSPITAL_COMMUNITY): Payer: Self-pay

## 2022-06-05 ENCOUNTER — Other Ambulatory Visit: Payer: Self-pay

## 2022-06-25 ENCOUNTER — Other Ambulatory Visit: Payer: Self-pay

## 2022-07-08 ENCOUNTER — Other Ambulatory Visit: Payer: Self-pay | Admitting: Primary Care

## 2022-07-09 ENCOUNTER — Other Ambulatory Visit: Payer: Self-pay

## 2022-07-09 ENCOUNTER — Other Ambulatory Visit: Payer: Self-pay | Admitting: Primary Care

## 2022-07-15 ENCOUNTER — Other Ambulatory Visit: Payer: Self-pay

## 2022-07-15 ENCOUNTER — Telehealth: Payer: Self-pay | Admitting: Primary Care

## 2022-07-15 DIAGNOSIS — Z3009 Encounter for other general counseling and advice on contraception: Secondary | ICD-10-CM | POA: Diagnosis not present

## 2022-07-15 MED ORDER — DIAZEPAM 10 MG PO TABS
ORAL_TABLET | ORAL | 0 refills | Status: DC
Start: 2022-07-15 — End: 2022-09-09
  Filled 2022-07-15: qty 2, 2d supply, fill #0

## 2022-07-15 MED ORDER — FLUTICASONE-SALMETEROL 250-50 MCG/ACT IN AEPB
1.0000 | INHALATION_SPRAY | Freq: Two times a day (BID) | RESPIRATORY_TRACT | 5 refills | Status: DC
  Filled 2022-07-15: qty 60, 30d supply, fill #0
  Filled 2022-08-09: qty 60, 30d supply, fill #1
  Filled 2022-09-11: qty 60, 30d supply, fill #2
  Filled 2022-10-23: qty 60, 30d supply, fill #3
  Filled 2022-11-16: qty 60, 30d supply, fill #4
  Filled 2022-12-15: qty 60, 30d supply, fill #5

## 2022-07-15 NOTE — Telephone Encounter (Signed)
Advair '250mg'$  has been sent to preferred pharmacy.  Patient is aware and voiced his understanding.  Nothing further needed.

## 2022-08-02 HISTORY — PX: VASECTOMY: SHX75

## 2022-08-06 ENCOUNTER — Other Ambulatory Visit: Payer: Self-pay | Admitting: Internal Medicine

## 2022-08-06 ENCOUNTER — Other Ambulatory Visit: Payer: Self-pay

## 2022-08-06 ENCOUNTER — Other Ambulatory Visit: Payer: Self-pay | Admitting: Family Medicine

## 2022-08-06 DIAGNOSIS — K219 Gastro-esophageal reflux disease without esophagitis: Secondary | ICD-10-CM

## 2022-08-06 MED ORDER — ESOMEPRAZOLE MAGNESIUM 40 MG PO CPDR
40.0000 mg | DELAYED_RELEASE_CAPSULE | Freq: Every day | ORAL | 0 refills | Status: DC
  Filled 2022-08-06: qty 90, 90d supply, fill #0

## 2022-08-07 ENCOUNTER — Other Ambulatory Visit: Payer: Self-pay

## 2022-08-07 MED ORDER — LOSARTAN POTASSIUM 50 MG PO TABS
ORAL_TABLET | Freq: Two times a day (BID) | ORAL | 0 refills | Status: DC
  Filled 2022-08-07 – 2022-09-11 (×2): qty 180, 90d supply, fill #0

## 2022-08-10 ENCOUNTER — Other Ambulatory Visit: Payer: Self-pay

## 2022-08-20 DIAGNOSIS — Z302 Encounter for sterilization: Secondary | ICD-10-CM | POA: Diagnosis not present

## 2022-09-09 ENCOUNTER — Other Ambulatory Visit: Payer: Self-pay

## 2022-09-09 ENCOUNTER — Encounter: Payer: Self-pay | Admitting: Nurse Practitioner

## 2022-09-09 ENCOUNTER — Ambulatory Visit: Payer: 59 | Admitting: Nurse Practitioner

## 2022-09-09 VITALS — BP 126/98 | HR 91 | Temp 96.7°F | Resp 14 | Ht 67.0 in | Wt 193.2 lb

## 2022-09-09 DIAGNOSIS — G4733 Obstructive sleep apnea (adult) (pediatric): Secondary | ICD-10-CM | POA: Diagnosis not present

## 2022-09-09 DIAGNOSIS — G8929 Other chronic pain: Secondary | ICD-10-CM

## 2022-09-09 DIAGNOSIS — I10 Essential (primary) hypertension: Secondary | ICD-10-CM

## 2022-09-09 DIAGNOSIS — A6002 Herpesviral infection of other male genital organs: Secondary | ICD-10-CM

## 2022-09-09 DIAGNOSIS — E6609 Other obesity due to excess calories: Secondary | ICD-10-CM

## 2022-09-09 DIAGNOSIS — M25512 Pain in left shoulder: Secondary | ICD-10-CM | POA: Diagnosis not present

## 2022-09-09 DIAGNOSIS — Z6830 Body mass index (BMI) 30.0-30.9, adult: Secondary | ICD-10-CM

## 2022-09-09 DIAGNOSIS — J4521 Mild intermittent asthma with (acute) exacerbation: Secondary | ICD-10-CM

## 2022-09-09 DIAGNOSIS — K219 Gastro-esophageal reflux disease without esophagitis: Secondary | ICD-10-CM

## 2022-09-09 DIAGNOSIS — Z0000 Encounter for general adult medical examination without abnormal findings: Secondary | ICD-10-CM

## 2022-09-09 MED ORDER — TIZANIDINE HCL 4 MG PO CAPS
4.0000 mg | ORAL_CAPSULE | Freq: Three times a day (TID) | ORAL | 0 refills | Status: AC | PRN
  Filled 2022-09-09: qty 90, 30d supply, fill #0

## 2022-09-09 NOTE — Assessment & Plan Note (Signed)
Encouraged healthy lifestyle modifications in regards to exercise.  Patient is starting to get back into that since his knees are healed pending labs today

## 2022-09-09 NOTE — Assessment & Plan Note (Signed)
No acute complaints currently.  General exam benign in office.

## 2022-09-09 NOTE — Assessment & Plan Note (Signed)
We will use tizanidine on a as needed basis.  The more he uses his works with it but tired of the muscle becomes tizanidine is helpful.  Refill provided today last prescription of 90 tablets lasted approximately 1 year.

## 2022-09-09 NOTE — Assessment & Plan Note (Signed)
Patient currently maintained on losartan 50 mg.  Patient admits he does have some whitecoat syndrome.  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

## 2022-09-09 NOTE — Assessment & Plan Note (Signed)
Patient has not had an endoscopy in 2020 that I reviewed.  Also colonoscopy that time.  Continue PPI therapy.

## 2022-09-09 NOTE — Assessment & Plan Note (Signed)
Discussed age-appropriate immunizations and screening exams.  Patient is up-to-date on colonoscopy.  Too young for prostate exam.  Patient was given information at discharge in regards to preventative healthcare maintenance and anticipatory guidance for patients in his age range.  Did encourage patient to work on lifestyle modifications inclusive of 30 minutes of exercise 5 times a week.

## 2022-09-09 NOTE — Assessment & Plan Note (Signed)
Patient currently maintained on Advair daily.  Albuterol inhaler as needed.  States end of September is generally his worst time of year but as of late has not used the inhaler

## 2022-09-09 NOTE — Progress Notes (Signed)
Established Patient Office Visit  Subjective   Patient ID: Bradley Schaefer, male    DOB: [birth year]  Age: 41 y.o. MRN: [id number]  Chief Complaint  Patient presents with   transfer of care   Medication Refill      HTN: Has a cuff but does not check it regularly. Tolerate losartan ok   OSA: DOes have a CPCP. States covid march of 2022 and started snoring after that. States that he went for a colonoscopy in 09/2021 and the CRNA states he had apena. States ususally 5-10 hours with the mask   Asthma: curretnly uses advair. States that with covid it was worse. States that he was not using the albuterol at all. States end of September hit and states he has not used it in the past week.  GERD: states that nexium does well. States that he will need to watch his diet. Spicy food is a trigger. If he drinks too much or drink more liquor it will. Enodscopu last movement   for complete physical and follow up of chronic conditions.  Immunizations: -Tetanus: 2017 -Influenza:09/01/2022 -Shingles: Too young -Pneumonia: Too young  -HPV:  Diet: Fair diet. Not a regular schedule. States 1-2 meals. States that he will have coffee in the am and water the rest of the day and soda at lunch  Exercise: No regular exercise. Wasn't because of knee. States that over the past couple weeks he has been weight lifting. 3 times a  week at 30 mins  Eye exam: Completes annually. Contacts and glasses. Last exam 2023 Dental exam: Completes semi-annually. [facility] dental     Colonoscopy: Completed in 2020, recall 2025 Lung Cancer Screening: N/A Dexa: N/A  PSA: Too young, currently average risk.    Sleep: States that he will go to bed aroun 11-12 and 7 am the next morning. Feels rested.    Left shoulder pain: states it will dislocate. States when he uses it it will flare up. States that it will last him a year.      Review of Systems  Constitutional:  Negative for chills and fever.  Respiratory:  Negative  for shortness of breath.   Cardiovascular:  Negative for chest pain.  Gastrointestinal:  Positive for diarrhea. Negative for abdominal pain, blood in stool, constipation, nausea and vomiting.       BM daily   Genitourinary:  Negative for dysuria and hematuria.  Neurological:  Positive for tingling (left arm). Negative for headaches.  Psychiatric/Behavioral:  Negative for hallucinations and suicidal ideas.       Objective:     BP (!) 126/98   Pulse 91   Temp (!) 96.7 F (35.9 C) (Temporal)   Resp 14   Ht '5\' 7"'$  (1.702 m)   Wt 193 lb 4 oz (87.7 kg)   SpO2 97%   BMI 30.27 kg/m  BP Readings from Last 3 Encounters:  09/09/22 (!) 126/98  04/08/22 120/80  12/31/21 130/72   Wt Readings from Last 3 Encounters:  09/09/22 193 lb 4 oz (87.7 kg)  04/08/22 190 lb (86.2 kg)  12/31/21 190 lb 12.8 oz (86.5 kg)      Physical Exam Vitals and nursing note reviewed. Exam conducted with a chaperone present [facility], RMA).  Constitutional:      Appearance: Normal appearance.  HENT:     Right Ear: Tympanic membrane, ear canal and external ear normal.     Left Ear: Tympanic membrane, ear canal and external ear normal.  Mouth/Throat:     Mouth: Mucous membranes are moist.     Pharynx: Oropharynx is clear.  Eyes:     Extraocular Movements: Extraocular movements intact.     Pupils: Pupils are equal, round, and reactive to light.     Comments: Contacts today  Neck:     Thyroid: No thyroid mass, thyromegaly or thyroid tenderness.  Cardiovascular:     Rate and Rhythm: Normal rate and regular rhythm.     Pulses: Normal pulses.     Heart sounds: Normal heart sounds.  Pulmonary:     Effort: Pulmonary effort is normal.     Breath sounds: Normal breath sounds.  Abdominal:     General: Bowel sounds are normal. There is no distension.     Palpations: There is no mass.     Tenderness: There is no abdominal tenderness.     Hernia: No hernia is present. There is no hernia in the  left inguinal area or right inguinal area.  Genitourinary:    Penis: Normal.      Testes: Normal.     Epididymis:     Right: Normal.     Left: Normal.  Musculoskeletal:     Right lower leg: No edema.     Left lower leg: No edema.  Lymphadenopathy:     Cervical: No cervical adenopathy.     Lower Body: No right inguinal adenopathy. No left inguinal adenopathy.  Skin:    General: Skin is warm.  Neurological:     General: No focal deficit present.     Mental Status: He is alert.     Deep Tendon Reflexes:     Reflex Scores:      Bicep reflexes are 2+ on the right side and 2+ on the left side.      Patellar reflexes are 2+ on the right side and 2+ on the left side.    Comments: Bilateral upper and lower extremity strength 5/5  Psychiatric:        Mood and Affect: Mood normal.        Behavior: Behavior normal.        Thought Content: Thought content normal.        Judgment: Judgment normal.      No results found for any visits on 09/09/22.    The 10-year ASCVD risk score (Arnett DK, et al., 2019) is: 1.5%    Assessment & Plan:   Problem List Items Addressed This Visit       Cardiovascular and Mediastinum   Essential hypertension    Patient currently maintained on losartan 50 mg.  Patient admits he does have some whitecoat syndrome.  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        Respiratory   Mild intermittent asthma with acute exacerbation    Patient currently maintained on Advair daily.  Albuterol inhaler as needed.  States end of September is generally his worst time of year but as of late has not used the inhaler      OSA (obstructive sleep apnea)    Patient currently maintained on CPAP therapy at home to using CPAP        Digestive   Gastroesophageal reflux disease    Patient has not had an endoscopy in 2020 that I reviewed.  Also colonoscopy that time.   Continue PPI therapy.        Genitourinary  Herpes genitalis in men    No acute complaints currently.  General exam benign in office.        Other   Preventative health care - Primary    Discussed age-appropriate immunizations and screening exams.  Patient is up-to-date on colonoscopy.  Too young for prostate exam.  Patient was given information at discharge in regards to preventative healthcare maintenance and anticipatory guidance for patients in his age range.  Did encourage patient to work on lifestyle modifications inclusive of 30 minutes of exercise 5 times a week.      Relevant Orders   CBC   Comprehensive metabolic panel   Hemoglobin A1c   TSH   Lipid panel   Chronic left shoulder pain    We will use tizanidine on a as needed basis.  The more he uses his works with it but tired of the muscle becomes tizanidine is helpful.  Refill provided today last prescription of 90 tablets lasted approximately 1 year.      Relevant Medications   tiZANidine (ZANAFLEX) 4 MG capsule   Class 1 obesity due to excess calories with serious comorbidity and body mass index (BMI) of 30.0 to 30.9 in adult    Encouraged healthy lifestyle modifications in regards to exercise.  Patient is starting to get back into that since his knees are healed pending labs today      Relevant Orders   Hemoglobin A1c   Lipid panel    Return in about 1 year (around 09/10/2023) for CPE and labs.    Romilda Garret, NP

## 2022-09-09 NOTE — Assessment & Plan Note (Signed)
Patient currently maintained on CPAP therapy at home to using CPAP

## 2022-09-09 NOTE — Patient Instructions (Signed)
Nice to see you today I will be in touch with the labs results once I have them Follow up with me in year for your next physical, sooner if you need me Make a FASTING lab appointment in the next 2 weeks when you leave today  Check blood pressures at home. If consistently 140 or greater on the top ro 90 or greater o the bottom let me know and we can adjust the blood pressure medication

## 2022-09-11 ENCOUNTER — Ambulatory Visit (INDEPENDENT_AMBULATORY_CARE_PROVIDER_SITE_OTHER): Payer: 59 | Admitting: Primary Care

## 2022-09-11 ENCOUNTER — Encounter: Payer: Self-pay | Admitting: Primary Care

## 2022-09-11 ENCOUNTER — Other Ambulatory Visit (INDEPENDENT_AMBULATORY_CARE_PROVIDER_SITE_OTHER): Payer: 59

## 2022-09-11 ENCOUNTER — Other Ambulatory Visit: Payer: Self-pay

## 2022-09-11 VITALS — BP 124/86 | HR 86 | Temp 98.1°F | Ht 67.0 in | Wt 195.4 lb

## 2022-09-11 DIAGNOSIS — E6609 Other obesity due to excess calories: Secondary | ICD-10-CM | POA: Diagnosis not present

## 2022-09-11 DIAGNOSIS — Z0000 Encounter for general adult medical examination without abnormal findings: Secondary | ICD-10-CM

## 2022-09-11 DIAGNOSIS — Z6830 Body mass index (BMI) 30.0-30.9, adult: Secondary | ICD-10-CM | POA: Diagnosis not present

## 2022-09-11 DIAGNOSIS — G4733 Obstructive sleep apnea (adult) (pediatric): Secondary | ICD-10-CM | POA: Diagnosis not present

## 2022-09-11 LAB — CBC
HCT: 47.3 % (ref 39.0–52.0)
Hemoglobin: 16.2 g/dL (ref 13.0–17.0)
MCHC: 34.2 g/dL (ref 30.0–36.0)
MCV: 87.9 fl (ref 78.0–100.0)
Platelets: 278 10*3/uL (ref 150.0–400.0)
RBC: 5.39 Mil/uL (ref 4.22–5.81)
RDW: 13.1 % (ref 11.5–15.5)
WBC: 7.7 10*3/uL (ref 4.0–10.5)

## 2022-09-11 LAB — COMPREHENSIVE METABOLIC PANEL
ALT: 31 U/L (ref 0–53)
AST: 25 U/L (ref 0–37)
Albumin: 4.2 g/dL (ref 3.5–5.2)
Alkaline Phosphatase: 63 U/L (ref 39–117)
BUN: 12 mg/dL (ref 6–23)
CO2: 31 mEq/L (ref 19–32)
Calcium: 9.4 mg/dL (ref 8.4–10.5)
Chloride: 101 mEq/L (ref 96–112)
Creatinine, Ser: 1.02 mg/dL (ref 0.40–1.50)
GFR: 90.17 mL/min (ref >60.00)
Glucose, Bld: 90 mg/dL (ref 70–99)
Potassium: 4.3 mEq/L (ref 3.5–5.1)
Sodium: 137 mEq/L (ref 135–145)
Total Bilirubin: 0.6 mg/dL (ref 0.2–1.2)
Total Protein: 7.3 g/dL (ref 6.0–8.3)

## 2022-09-11 LAB — LIPID PANEL
Cholesterol: 172 mg/dL (ref 0–200)
HDL: 41.6 mg/dL (ref >39.00)
LDL Cholesterol: 102 mg/dL — ABNORMAL HIGH (ref 0–99)
NonHDL: 130.8
Total CHOL/HDL Ratio: 4
Triglycerides: 145 mg/dL (ref 0.0–149.0)
VLDL: 29 mg/dL (ref 0.0–40.0)

## 2022-09-11 LAB — TSH: TSH: 2.95 u[IU]/mL (ref 0.35–5.50)

## 2022-09-11 LAB — HEMOGLOBIN A1C: Hgb A1c MFr Bld: 5.6 % (ref 4.6–6.5)

## 2022-09-11 NOTE — Patient Instructions (Signed)
Great job wearing CPAP, sleep apnea is currently well controlled on auto pressure settings 5-15cm h20. No changes today  Recommendations: Continue to wear CPAP every night 4-6 hours or longer Do not drive if experiencing excessive daytime sleepiness  Follow-up: 1 year with Beth NP or sooner if needed   CPAP and BIPAP Information CPAP and BIPAP are methods that use air pressure to keep your airways open and to help you breathe well. CPAP and BIPAP use different amounts of pressure. Your health care provider will tell you whether CPAP or BIPAP would be more helpful for you. CPAP stands for "continuous positive airway pressure." With CPAP, the amount of pressure stays the same while you breathe in (inhale) and out (exhale). BIPAP stands for "bi-level positive airway pressure." With BIPAP, the amount of pressure will be higher when you inhale and lower when you exhale. This allows you to take larger breaths. CPAP or BIPAP may be used in the hospital, or your health care provider may want you to use it at home. You may need to have a sleep study before your health care provider can order a machine for you to use at home. What are the advantages? CPAP or BIPAP can be helpful if you have: Sleep apnea. Chronic obstructive pulmonary disease (COPD). Heart failure. Medical conditions that cause muscle weakness, including muscular dystrophy or amyotrophic lateral sclerosis (ALS). Other problems that cause breathing to be shallow, weak, abnormal, or difficult. CPAP and BIPAP are most commonly used for obstructive sleep apnea (OSA) to keep the airways from collapsing when the muscles relax during sleep. What are the risks? Generally, this is a safe treatment. However, problems may occur, including: Irritated skin or skin sores if the mask does not fit properly. Dry or stuffy nose or nosebleeds. Dry mouth. Feeling gassy or bloated. Sinus or lung infection if the equipment is not cleaned properly. When  should CPAP or BIPAP be used? In most cases, the mask only needs to be worn during sleep. Generally, the mask needs to be worn throughout the night and during any daytime naps. People with certain medical conditions may also need to wear the mask at other times, such as when they are awake. Follow instructions from your health care provider about when to use the machine. What happens during CPAP or BIPAP?  Both CPAP and BIPAP are provided by a small machine with a flexible plastic tube that attaches to a plastic mask that you wear. Air is blown through the mask into your nose or mouth. The amount of pressure that is used to blow the air can be adjusted on the machine. Your health care provider will set the pressure setting and help you find the best mask for you. Tips for using the mask Because the mask needs to be snug, some people feel trapped or closed-in (claustrophobic) when first using the mask. If you feel this way, you may need to get used to the mask. One way to do this is to hold the mask loosely over your nose or mouth and then gradually apply the mask more snugly. You can also gradually increase the amount of time that you use the mask. Masks are available in various types and sizes. If your mask does not fit well, talk with your health care provider about getting a different one. Some common types of masks include: Full face masks, which fit over the mouth and nose. Nasal masks, which fit over the nose. Nasal pillow or prong masks, which  fit into the nostrils. If you are using a mask that fits over your nose and you tend to breathe through your mouth, a chin strap may be applied to help keep your mouth closed. Use a skin barrier to protect your skin as told by your health care provider. Some CPAP and BIPAP machines have alarms that may sound if the mask comes off or develops a leak. If you have trouble with the mask, it is very important that you talk with your health care provider about  finding a way to make the mask easier to tolerate. Do not stop using the mask. There could be a negative impact on your health if you stop using the mask. Tips for using the machine Place your CPAP or BIPAP machine on a secure table or stand near an electrical outlet. Know where the on/off switch is on the machine. Follow instructions from your health care provider about how to set the pressure on your machine and when you should use it. Do not eat or drink while the CPAP or BIPAP machine is on. Food or fluids could get pushed into your lungs by the pressure of the CPAP or BIPAP. For home use, CPAP and BIPAP machines can be rented or purchased through home health care companies. Many different brands of machines are available. Renting a machine before purchasing may help you find out which particular machine works well for you. Your health insurance company may also decide which machine you may get. Keep the CPAP or BIPAP machine and attachments clean. Ask your health care provider for specific instructions. Check the humidifier if you have a dry stuffy nose or nosebleeds. Make sure it is working correctly. Follow these instructions at home: Take over-the-counter and prescription medicines only as told by your health care provider. Ask if you can take sinus medicine if your sinuses are blocked. Do not use any products that contain nicotine or tobacco. These products include cigarettes, chewing tobacco, and vaping devices, such as e-cigarettes. If you need help quitting, ask your health care provider. Keep all follow-up visits. This is important. Contact a health care provider if: You have redness or pressure sores on your head, face, mouth, or nose from the mask or head gear. You have trouble using the CPAP or BIPAP machine. You cannot tolerate wearing the CPAP or BIPAP mask. Someone tells you that you snore even when wearing your CPAP or BIPAP. Get help right away if: You have trouble  breathing. You feel confused. Summary CPAP and BIPAP are methods that use air pressure to keep your airways open and to help you breathe well. If you have trouble with the mask, it is very important that you talk with your health care provider about finding a way to make the mask easier to tolerate. Do not stop using the mask. There could be a negative impact to your health if you stop using the mask. Follow instructions from your health care provider about when to use the machine. This information is not intended to replace advice given to you by your health care provider. Make sure you discuss any questions you have with your health care provider. Document Revised: 05/28/2021 Document Reviewed: 09/27/2020 Elsevier Patient Education  [place].

## 2022-09-11 NOTE — Progress Notes (Unsigned)
$'@Patient'v$  ID: Bradley Schaefer, male    DOB: 07-09-1981, 41 y.o.   MRN: [id number]  No chief complaint on file.   Referring provider: Waunita Schooner, MD  HPI: 41 year old male, never smoked. PMH significant for HTN, mild intermittent asthma, ADHD.   Previous [facility] pulmonary encounter:  11/07/2021 Patient presents today for sleep consult. He has symptoms of loud snoring, witnessed apnea, restless sleep and daytime sleepiness. Symptoms are worse since having covid in March 2022. He works day shift as a Marine scientist. He had a colonoscopy recently and had issues to apnea. Typical bedtime is between 10pm-12am. He wakes up on average 5-10 times a night.   He saw his PCP on 10/03/21 for mild intermittent asthma symptoms, treated with 6 day course of prednisone. Maintained on Advair 155mg 1 puff twice daily and cetirizine. He has noticed increased shortness of breath as well since having covid. He uses his albuterol rescue inhaler 5-6 times a week.   Sleep questionnaire: Prior sleep study- None Symptoms- loud snoring, choking, stops breathing during sleep, restless sleep, daytime sleepiness Bedtime- 10pm-12am  Nocturnal awakenings- 5-10 times Out of bed- 5:30am/ 8-10am Weight- up 10 lbs  [facility]- 9  12/31/2021 Patient presents today to review sleep study results/Pulmonary function testing. Patient had sleep study with [facility] on 11/27/21 which showed moderate-severe OSA, AHI 26.6/hr with SpO2 low 75%. Discussed risk of untreated sleep apnea and treatment options. He is open to starting CPAP since his sleep has worsened and he continues to have daytime fatigue. He is maintained on Adviar 250-524m one puffs morning and evening for asthma. PFTs were normal. Feels higher dose ICS inhaler has helps his breathing, not having as much shortness of breath and using Albuterol less.    09/11/2022 - Interim hx Patient presents today for 6 month follow-up.   He is doing well Fatigue and sleep have improved since  starting CPAP  He feels he is back to his baseline energy  He is having no issues with mask fit or pressure settings CPAP currently on auto settings 5-15cm h20. He uses nasal mask.  He works 12 hour day shift He is getting 7.5 hours of usage  He is using nasal pillow mask  DME is [employer]  [employer]0/10/23-11/8/23 Usage 30/30 days; 29 days (97%) > 4 hours  Average usage 7 hours 32 mins Pressure 5-15cm h20 (10.9cm h20-95%) Airleaks 2L/min (95%) AHI 1.3    No Known Allergies  Immunization History  Administered Date(s) Administered   Influenza,inj,Quad PF,6+ Mos 09/01/2022   Influenza-Unspecified 07/29/2017, 07/25/2018, 07/28/2019, 07/03/2021   PFIZER(Purple Top)SARS-COV-2 Vaccination 08/13/2020   Tdap 04/15/2016    Past Medical History:  Diagnosis Date   ADD (attention deficit disorder)    Asthma    Dislocation of left patella 12/22/2018   GERD (gastroesophageal reflux disease)    Herpes simplex    Hypertension    Varicose veins     Tobacco History: Social History   Tobacco Use  Smoking Status Never  Smokeless Tobacco Never   Counseling given: Not Answered   Outpatient Medications Prior to Visit  Medication Sig Dispense Refill   albuterol (VENTOLIN HFA) 108 (90 Base) MCG/ACT inhaler Inhale 2 puffs into the lungs every 6 (six) hours as needed for wheezing or shortness of breath. 18 g 2   cetirizine (ZYRTEC) 10 MG tablet Take 10 mg by mouth daily as needed for allergies.     esomeprazole (NEXIUM) 40 MG capsule Take 1 capsule (40 mg total) by  mouth daily. 90 capsule 0   fluticasone-salmeterol (ADVAIR DISKUS) 250-50 MCG/ACT AEPB Inhale 1 puff into the lungs in the morning and at bedtime. 60 each 5   losartan (COZAAR) 50 MG tablet TAKE 1 TABLET BY MOUTH 2 (TWO) TIMES DAILY. 180 tablet 0   tiZANidine (ZANAFLEX) 4 MG capsule Take 1 capsule (4 mg total) by mouth 3 (three) times daily as needed for muscle spasms. 90 capsule 0   valACYclovir (VALTREX) 1000 MG tablet  Take 1 tablet (1,000 mg total) by mouth daily. 90 tablet 0   No facility-administered medications prior to visit.    Review of Systems  Review of Systems  Constitutional: Negative.  Negative for fatigue.  Respiratory: Negative.    Cardiovascular: Negative.      Physical Exam  There were no vitals taken for this visit. Physical Exam Constitutional:      Appearance: Normal appearance.  HENT:     Head: Normocephalic and atraumatic.     Mouth/Throat:     Mouth: Mucous membranes are moist.     Pharynx: Oropharynx is clear.  Cardiovascular:     Rate and Rhythm: Normal rate and regular rhythm.  Pulmonary:     Effort: Pulmonary effort is normal.     Breath sounds: Normal breath sounds.  Skin:    General: Skin is warm and dry.  Neurological:     General: No focal deficit present.     Mental Status: He is alert and oriented to person, place, and time. Mental status is at baseline.  Psychiatric:        Mood and Affect: Mood normal.        Behavior: Behavior normal.        Thought Content: Thought content normal.        Judgment: Judgment normal.      Lab Results:  CBC    Component Value Date/Time   WBC 8.8 07/24/2021 1108   RBC 5.48 07/24/2021 1108   HGB 16.8 07/24/2021 1108   HCT 48.2 07/24/2021 1108   PLT 283.0 07/24/2021 1108   MCV 87.9 07/24/2021 1108   MCHC 34.8 07/24/2021 1108   RDW 13.6 07/24/2021 1108   LYMPHSABS 2.8 07/24/2021 1108   MONOABS 0.9 07/24/2021 1108   EOSABS 0.1 07/24/2021 1108   BASOSABS 0.0 07/24/2021 1108    BMET    Component Value Date/Time   NA 138 07/24/2021 1108   K 4.0 07/24/2021 1108   CL 104 07/24/2021 1108   CO2 24 07/24/2021 1108   GLUCOSE 86 07/24/2021 1108   BUN 9 07/24/2021 1108   CREATININE 0.95 07/24/2021 1108   CALCIUM 9.7 07/24/2021 1108   GFRNONAA 105.43 04/05/2009 0933   GFRAA 130 11/29/2006 1022    BNP No results found for: "BNP"  ProBNP No results found for: "PROBNP"  Imaging: No results  found.   Assessment & Plan:   No problem-specific Assessment & Plan notes found for this encounter.     Martyn Ehrich, NP 09/11/2022

## 2022-09-14 NOTE — Progress Notes (Signed)
Reviewed and agree with assessment/plan.   Chesley Mires, MD [facility] Pulmonary/Critical Care 09/14/2022, 9:35 AM Pager:  [phone]

## 2022-09-14 NOTE — Assessment & Plan Note (Signed)
-   Well controlled on CPAP/ Patient has symptoms of loud snoring, daytime sleepiness and witnessed apnea.  Home sleep study on 126/23 showed moderate obstructive sleep apnea, AHI 26.6 an hour with SPO2 low 75%.  He was started on CPAP back in March 2023.  He is 100% compliant with use, average usage 7 hours 32 minutes.  No issues with mask fit or pressure settings.  He reports improvement in fatigue and sleep quality since starting PAP therapy.  Current pressure settings auto 5 to 15 cm H2O; Residual AHI 1.3/hour.  No changes today.  Encourage patient continue to wear CPAP every night for minimum 4 to 6 hours or longer.  Advised against driving if experiencing excessive daytime sleepiness or fatigue.  Focus on side sleeping position and encouraged weight loss efforts.  Follow-up in 1 year or sooner.

## 2022-09-21 ENCOUNTER — Telehealth: Payer: 59 | Admitting: Family Medicine

## 2022-09-21 ENCOUNTER — Other Ambulatory Visit: Payer: Self-pay

## 2022-09-21 DIAGNOSIS — H44002 Unspecified purulent endophthalmitis, left eye: Secondary | ICD-10-CM | POA: Diagnosis not present

## 2022-09-21 DIAGNOSIS — B9689 Other specified bacterial agents as the cause of diseases classified elsewhere: Secondary | ICD-10-CM | POA: Diagnosis not present

## 2022-09-21 DIAGNOSIS — J019 Acute sinusitis, unspecified: Secondary | ICD-10-CM | POA: Diagnosis not present

## 2022-09-21 MED ORDER — AMOXICILLIN-POT CLAVULANATE 875-125 MG PO TABS
1.0000 | ORAL_TABLET | Freq: Two times a day (BID) | ORAL | 0 refills | Status: AC
  Filled 2022-09-21: qty 14, 7d supply, fill #0

## 2022-09-21 MED ORDER — POLYMYXIN B-TRIMETHOPRIM 10000-0.1 UNIT/ML-% OP SOLN
1.0000 [drp] | Freq: Four times a day (QID) | OPHTHALMIC | 0 refills | Status: DC
  Filled 2022-09-21: qty 10, 5d supply, fill #0

## 2022-09-21 NOTE — Progress Notes (Signed)
E-Visit for Sinus Problems  We are sorry that you are not feeling well.  Here is how we plan to help!  Based on what you have shared with me it looks like you have sinusitis.  Sinusitis is inflammation and infection in the sinus cavities of the head.  Based on your presentation I believe you most likely have Acute Bacterial Sinusitis.  This is an infection caused by bacteria and is treated with antibiotics. I have prescribed Augmentin '875mg'$ /'125mg'$  one tablet twice daily with food, for 7 days. You may use an oral decongestant such as Mucinex D or if you have glaucoma or high blood pressure use plain Mucinex. Saline nasal spray help and can safely be used as often as needed for congestion.  If you develop worsening sinus pain, fever or notice severe headache and vision changes, or if symptoms are not better after completion of antibiotic, please schedule an appointment with a health care provider.    Sinus infections are not as easily transmitted as other respiratory infection, however we still recommend that you avoid close contact with loved ones, especially the very young and elderly.  Remember to wash your hands thoroughly throughout the day as this is the number one way to prevent the spread of infection!  I will order you eye drops as well if the oral medication does not seem to improve them. Polytrim to use 1 drop four times daily for 5 days.  Home Care: Only take medications as instructed by your medical team. Complete the entire course of an antibiotic. Do not take these medications with alcohol. A steam or ultrasonic humidifier can help congestion.  You can place a towel over your head and breathe in the steam from hot water coming from a faucet. Avoid close contacts especially the very young and the elderly. Cover your mouth when you cough or sneeze. Always remember to wash your hands.  Get Help Right Away If: You develop worsening fever or sinus pain. You develop a severe head ache or  visual changes. Your symptoms persist after you have completed your treatment plan.  Make sure you Understand these instructions. Will watch your condition. Will get help right away if you are not doing well or get worse.  Thank you for choosing an e-visit.  Your e-visit answers were reviewed by a board certified advanced clinical practitioner to complete your personal care plan. Depending upon the condition, your plan could have included both over the counter or prescription medications.  Please review your pharmacy choice. Make sure the pharmacy is open so you can pick up prescription now. If there is a problem, you may contact your provider through [employer] and have the prescription routed to another pharmacy.  Your safety is important to [place]. If you have drug allergies check your prescription carefully.   For the next 24 hours you can use MyChart to ask questions about today's visit, request a non-urgent call back, or ask for a work or school excuse. You will get an email in the next two days asking about your experience. I hope that your e-visit has been valuable and will speed your recovery.  I provided 5 minutes of non face-to-face time during this encounter for chart review, medication and order placement, as well as and documentation.

## 2022-11-03 ENCOUNTER — Other Ambulatory Visit (HOSPITAL_COMMUNITY): Payer: Self-pay

## 2022-11-16 ENCOUNTER — Other Ambulatory Visit: Payer: Self-pay

## 2022-11-16 ENCOUNTER — Other Ambulatory Visit: Payer: Self-pay | Admitting: Internal Medicine

## 2022-11-16 DIAGNOSIS — K219 Gastro-esophageal reflux disease without esophagitis: Secondary | ICD-10-CM

## 2022-11-16 MED ORDER — ESOMEPRAZOLE MAGNESIUM 40 MG PO CPDR
40.0000 mg | DELAYED_RELEASE_CAPSULE | Freq: Every day | ORAL | 0 refills | Status: DC
  Filled 2022-11-16: qty 90, 90d supply, fill #0

## 2022-11-25 DIAGNOSIS — G4733 Obstructive sleep apnea (adult) (pediatric): Secondary | ICD-10-CM | POA: Diagnosis not present

## 2022-12-15 ENCOUNTER — Other Ambulatory Visit: Payer: Self-pay

## 2022-12-15 ENCOUNTER — Other Ambulatory Visit: Payer: Self-pay | Admitting: Nurse Practitioner

## 2022-12-16 ENCOUNTER — Other Ambulatory Visit: Payer: Self-pay

## 2022-12-16 MED FILL — Losartan Potassium Tab 50 MG: ORAL | 90 days supply | Qty: 180 | Fill #0 | Status: AC

## 2022-12-25 ENCOUNTER — Other Ambulatory Visit: Payer: Self-pay

## 2022-12-26 DIAGNOSIS — G4733 Obstructive sleep apnea (adult) (pediatric): Secondary | ICD-10-CM | POA: Diagnosis not present

## 2023-01-22 ENCOUNTER — Other Ambulatory Visit: Payer: Self-pay | Admitting: Primary Care

## 2023-01-22 ENCOUNTER — Other Ambulatory Visit: Payer: Self-pay

## 2023-01-22 MED ORDER — FLUTICASONE-SALMETEROL 250-50 MCG/ACT IN AEPB
1.0000 | INHALATION_SPRAY | Freq: Two times a day (BID) | RESPIRATORY_TRACT | 5 refills | Status: DC
  Filled 2023-01-22: qty 60, 30d supply, fill #0
  Filled 2023-02-16 – 2023-02-26 (×2): qty 60, 30d supply, fill #1
  Filled 2023-03-30: qty 60, 30d supply, fill #2
  Filled 2023-05-04: qty 60, 30d supply, fill #3
  Filled 2023-06-09: qty 60, 30d supply, fill #4
  Filled 2023-07-20: qty 60, 30d supply, fill #5

## 2023-01-24 DIAGNOSIS — G4733 Obstructive sleep apnea (adult) (pediatric): Secondary | ICD-10-CM | POA: Diagnosis not present

## 2023-02-10 ENCOUNTER — Other Ambulatory Visit: Payer: Self-pay | Admitting: Internal Medicine

## 2023-02-10 ENCOUNTER — Other Ambulatory Visit: Payer: Self-pay

## 2023-02-10 DIAGNOSIS — K219 Gastro-esophageal reflux disease without esophagitis: Secondary | ICD-10-CM

## 2023-02-10 MED ORDER — ESOMEPRAZOLE MAGNESIUM 40 MG PO CPDR
40.0000 mg | DELAYED_RELEASE_CAPSULE | Freq: Every day | ORAL | 0 refills | Status: DC
  Filled 2023-02-10: qty 90, 90d supply, fill #0

## 2023-02-24 ENCOUNTER — Other Ambulatory Visit (HOSPITAL_COMMUNITY): Payer: Self-pay

## 2023-02-26 ENCOUNTER — Other Ambulatory Visit: Payer: Self-pay

## 2023-03-25 DIAGNOSIS — G4733 Obstructive sleep apnea (adult) (pediatric): Secondary | ICD-10-CM | POA: Diagnosis not present

## 2023-03-30 MED FILL — Losartan Potassium Tab 50 MG: ORAL | 90 days supply | Qty: 180 | Fill #1 | Status: AC

## 2023-03-31 ENCOUNTER — Other Ambulatory Visit: Payer: Self-pay

## 2023-04-24 ENCOUNTER — Telehealth: Payer: Commercial Managed Care - PPO | Admitting: Nurse Practitioner

## 2023-04-24 DIAGNOSIS — S81812A Laceration without foreign body, left lower leg, initial encounter: Secondary | ICD-10-CM

## 2023-04-24 NOTE — Progress Notes (Signed)
Based on what you shared with me it looks like you have laceration,that should be evaluated in a face to face office visit. You need to be seen in urgent care to have wound cleaned and possibly stitched. Your last tetanus shot was in 2017 and with and injury like you have you will need a repeat tetanus shot. Has to be within 5 years with your injury.   NOTE: There will be NO CHARGE for this eVisit   If you are having a true medical emergency please call 911.      For an urgent face to face visit, [place] has six urgent care centers for your convenience:     [facility] at [facility] Directions [phone] [address] [place], [place] [address]    [facility]) Get Driving Directions [phone] [address] [place], [place] [address]  [facility]) Get Driving Directions [phone] [address] [place],  [place]  [address]  [facility] at [facility] Get Driving Directions [phone] [address] [place], [place] [address]   [facility] at [facility] Get Driving Directions  [phone] [address] [place], [place] [address]   [facility] Directions [phone] [address] [place], [place] [address]  Your MyChart E-visit questionnaire answers were reviewed by a board certified advanced clinical practitioner to complete your personal care plan based on your specific symptoms.  Thank you for using e-Visits.

## 2023-04-25 ENCOUNTER — Ambulatory Visit
Admission: EM | Admit: 2023-04-25 | Discharge: 2023-04-25 | Disposition: A | Discharge status: Home / Self Care | Payer: Commercial Managed Care - PPO | Attending: Urgent Care | Admitting: Urgent Care

## 2023-04-25 DIAGNOSIS — Z23 Encounter for immunization: Secondary | ICD-10-CM | POA: Diagnosis not present

## 2023-04-25 DIAGNOSIS — S81811A Laceration without foreign body, right lower leg, initial encounter: Secondary | ICD-10-CM | POA: Diagnosis not present

## 2023-04-25 DIAGNOSIS — G4733 Obstructive sleep apnea (adult) (pediatric): Secondary | ICD-10-CM | POA: Diagnosis not present

## 2023-04-25 MED ORDER — TETANUS-DIPHTH-ACELL PERTUSSIS 5-2.5-18.5 LF-MCG/0.5 IM SUSY
0.5000 mL | PREFILLED_SYRINGE | Freq: Once | INTRAMUSCULAR | Status: AC
  Administered 2023-04-25: 0.5 mL via INTRAMUSCULAR

## 2023-04-25 MED ORDER — SULFAMETHOXAZOLE-TRIMETHOPRIM 800-160 MG PO TABS: 1.0000 | ORAL_TABLET | Freq: Two times a day (BID) | ORAL | 0 refills | Status: AC

## 2023-04-25 NOTE — ED Provider Notes (Signed)
Renaldo Fiddler    CSN: [id number] Arrival date & time: 04/25/23  2130      History   Chief Complaint No chief complaint on file.   HPI Milt Coye is a 42 y.o. male.   HPI  Presents to urgent care with concern for right lower leg infection.  Endorses increasing redness and swelling to the site.  He reports laceration to his right anterior lower leg a few days ago when he "smashed" it against a tool.  Past Medical History:  Diagnosis Date   ADD (attention deficit disorder)    Asthma    Dislocation of left patella 12/22/2018   GERD (gastroesophageal reflux disease)    Herpes simplex    Hypertension    Varicose veins     Patient Active Problem List   Diagnosis Date Noted   Class 1 obesity due to excess calories with serious comorbidity and body mass index (BMI) of 30.0 to 30.9 in adult 09/09/2022   OSA (obstructive sleep apnea) 12/31/2021   Allergic rhinitis due to pollen 10/03/2021   At risk for sleep apnea 10/03/2021   Bloody diarrhea 07/24/2021   Chronic left shoulder pain 07/24/2021   Essential hypertension 05/02/2020   Alcohol use 03/19/2020   Gastroesophageal reflux disease 03/19/2020   Chronic pain of right knee 12/28/2018   ED (erectile dysfunction) 05/02/2018   Preventative health care 04/04/2015   Herpes genitalis in men 04/05/2009   Attention deficit hyperactivity disorder (ADHD) 04/05/2009   INSOMNIA 03/22/2008   Mild intermittent asthma with acute exacerbation 02/25/2008   ALLERGY 02/25/2008    Past Surgical History:  Procedure Laterality Date   ENDOVENOUS ABLATION SAPHENOUS VEIN W/ LASER Left 06/19/2020   endovenous laser ablation left greater saphenous vein and stab phlebectomy > 20 incisions left leg by Fabienne Bruns MD    TYMPANOSTOMY TUBE PLACEMENT     VASECTOMY  08/02/2022   Dr [facility]in [facility]       Home Medications    Prior to Admission medications   Medication Sig Start Date End Date Taking? Authorizing Provider   albuterol (VENTOLIN HFA) 108 (90 Base) MCG/ACT inhaler Inhale 2 puffs into the lungs every 6 (six) hours as needed for wheezing or shortness of breath. 10/03/21   Karie Schwalbe, MD  cetirizine (ZYRTEC) 10 MG tablet Take 10 mg by mouth daily as needed for allergies.    [provider]  esomeprazole (NEXIUM) 40 MG capsule Take 1 capsule (40 mg total) by mouth daily. 02/10/23   Imogene Burn, MD  fluticasone-salmeterol (ADVAIR DISKUS) 250-50 MCG/ACT AEPB Inhale 1 puff into the lungs in the morning and at bedtime. 01/22/23   Glenford Bayley, NP  losartan (COZAAR) 50 MG tablet Take 1 tablet (50 mg total) by mouth 2 (two) times daily. 12/16/22 12/16/23  Eden Emms, NP  tiZANidine (ZANAFLEX) 4 MG capsule Take 1 capsule (4 mg total) by mouth 3 (three) times daily as needed for muscle spasms. 09/09/22   Eden Emms, NP  trimethoprim-polymyxin b (POLYTRIM) ophthalmic solution Place 1 drop into the left eye in the morning, at noon, in the evening, and at bedtime for 5 days 09/21/22   Freddy Finner, NP  valACYclovir (VALTREX) 1000 MG tablet Take 1 tablet (1,000 mg total) by mouth daily. 07/24/21   Gweneth Dimitri, MD    Family History Family History  Problem Relation Age of Onset   Crohn's disease Mother    Diabetes Mother    Squamous cell  carcinoma Mother    Basal cell carcinoma Mother    Hypertension Father    GER disease Father    Asthma Father    Lung disease Maternal Grandmother        Sarcodosis   Brain cancer Maternal Grandfather 66   Lung cancer Paternal Grandmother    Bladder Cancer Paternal Grandfather    Colon cancer Neg Hx    Stomach cancer Neg Hx    Pancreatic cancer Neg Hx     Social History Social History   Tobacco Use   Smoking status: Never   Smokeless tobacco: Never  Vaping Use   Vaping Use: Never used  Substance Use Topics   Alcohol use: Yes    Comment: 2-3 typicall day, weekends 6-10   Drug use: No     Allergies   Patient has no known  allergies.   Review of Systems Review of Systems   Physical Exam Triage Vital Signs ED Triage Vitals [04/25/23 0826]  Enc Vitals Group     BP (!) 145/88     Pulse Rate 88     Resp 18     Temp 97.9 F (36.6 C)     Temp Source Temporal     SpO2 98 %     Weight      Height      Head Circumference      Peak Flow      Pain Score      Pain Loc      Pain Edu?      Excl. in GC?    No data found.  Updated Vital Signs BP (!) 145/88 (BP Location: Left Arm)   Pulse 88   Temp 97.9 F (36.6 C) (Temporal)   Resp 18   SpO2 98%   Visual Acuity Right Eye Distance:   Left Eye Distance:   Bilateral Distance:    Right Eye Near:   Left Eye Near:    Bilateral Near:     Physical Exam Vitals reviewed.  Constitutional:      Appearance: Normal appearance.  Musculoskeletal:       Legs:  Skin:    General: Skin is warm and dry.  Neurological:     General: No focal deficit present.     Mental Status: He is alert and oriented to person, place, and time.  Psychiatric:        Mood and Affect: Mood normal.        Behavior: Behavior normal.      UC Treatments / Results  Labs (all labs ordered are listed, but only abnormal results are displayed) Labs Reviewed - No data to display  EKG   Radiology No results found.  Procedures Procedures (including critical care time)  Medications Ordered in UC Medications - No data to display  Initial Impression / Assessment and Plan / UC Course  I have reviewed the triage vital signs and the nursing notes.  Pertinent labs & imaging results that were available during my care of the patient were reviewed by me and considered in my medical decision making (see chart for details).   Tipton Ballow is a 42 y.o. male presenting with RLE wound. Patient is afebrile without recent antipyretics, satting well on room air. Overall is well appearing, well hydrated, without respiratory distress.   0.5 x 1 cm wound on anterior RLE surrounded by an  area of erythema and mild edema 2.5 x 2.5 cm. Wound with some development of eschar or soft scabbing.  Moist. Some tenderness to palpation.  Reviewed relevant chart history.   Wound dressed with antibiotic ointment and covered with large Band-Aid.  Wound care instructions provided to patient.  Bactrim ordered for developing cellulitis and possible purulent discharge.  Counseled patient on potential for adverse effects with medications prescribed/recommended today, ER and return-to-clinic precautions discussed, patient verbalized understanding and agreement with care plan.   Final Clinical Impressions(s) / UC Diagnoses   Final diagnoses:  None   Discharge Instructions   None    ED Prescriptions   None    PDMP not reviewed this encounter.   Charma Igo, [place] 04/25/23 [phone]

## 2023-04-25 NOTE — Discharge Instructions (Signed)
Apply moist warm compresses with dissolved Epsom salts multiple times daily.  Use petroleum jelly on the wound between treatments.  Keep the wound covered with a sterile dressing or bandage.  Take the antibiotic minimum of 5 days.  May stop after 5 days if no signs of infection, otherwise continue until 7 days.  If there is still redness or other signs of infection after 7 days, return for reevaluation of the wound and possible need for extension of antibiotic treatment.  We are unable to close the wound with sutures today because of the length of time that has elapsed since the initial injury.  We gave you a tetanus booster today because your last was in 2017.

## 2023-04-25 NOTE — ED Triage Notes (Signed)
Patient presents to [facility] for right lower leg infection Increased redness and swelling to site. Applying neosporin.

## 2023-05-04 ENCOUNTER — Other Ambulatory Visit: Payer: Self-pay

## 2023-05-04 ENCOUNTER — Other Ambulatory Visit: Payer: Self-pay | Admitting: Internal Medicine

## 2023-05-04 DIAGNOSIS — K219 Gastro-esophageal reflux disease without esophagitis: Secondary | ICD-10-CM

## 2023-05-04 MED ORDER — ESOMEPRAZOLE MAGNESIUM 40 MG PO CPDR
40.0000 mg | DELAYED_RELEASE_CAPSULE | Freq: Every day | ORAL | 0 refills | Status: DC
  Filled 2023-05-04: qty 90, 90d supply, fill #0

## 2023-06-11 DIAGNOSIS — G4733 Obstructive sleep apnea (adult) (pediatric): Secondary | ICD-10-CM | POA: Diagnosis not present

## 2023-06-24 ENCOUNTER — Other Ambulatory Visit: Payer: Self-pay | Admitting: Nurse Practitioner

## 2023-06-24 NOTE — Telephone Encounter (Signed)
LAST APPOINTMENT DATE: 09/09/22   NEXT APPOINTMENT DATE: Visit date not found   Losartan 50mg  LAST REFILL: 12/16/22  QTY: #180 1RF

## 2023-06-25 ENCOUNTER — Other Ambulatory Visit: Payer: Self-pay

## 2023-06-25 MED ORDER — LOSARTAN POTASSIUM 50 MG PO TABS
50.0000 mg | ORAL_TABLET | Freq: Two times a day (BID) | ORAL | 0 refills | Status: DC
  Filled 2023-06-25: qty 180, 90d supply, fill #0

## 2023-06-25 NOTE — Telephone Encounter (Signed)
Please call and schedule CPE for 09/11/2023 or after

## 2023-06-28 NOTE — Telephone Encounter (Signed)
LVMTCB and schedule

## 2023-07-12 DIAGNOSIS — G4733 Obstructive sleep apnea (adult) (pediatric): Secondary | ICD-10-CM | POA: Diagnosis not present

## 2023-07-30 ENCOUNTER — Encounter: Payer: Self-pay | Admitting: Internal Medicine

## 2023-07-30 ENCOUNTER — Other Ambulatory Visit: Payer: Self-pay

## 2023-07-30 ENCOUNTER — Other Ambulatory Visit: Payer: Self-pay | Admitting: Internal Medicine

## 2023-07-30 ENCOUNTER — Ambulatory Visit: Payer: Commercial Managed Care - PPO | Admitting: Internal Medicine

## 2023-07-30 VITALS — BP 120/84 | HR 76 | Temp 99.1°F | Ht 67.0 in | Wt 200.0 lb

## 2023-07-30 DIAGNOSIS — S161XXA Strain of muscle, fascia and tendon at neck level, initial encounter: Secondary | ICD-10-CM | POA: Insufficient documentation

## 2023-07-30 DIAGNOSIS — K219 Gastro-esophageal reflux disease without esophagitis: Secondary | ICD-10-CM

## 2023-07-30 MED ORDER — ESOMEPRAZOLE MAGNESIUM 40 MG PO CPDR
40.0000 mg | DELAYED_RELEASE_CAPSULE | Freq: Every day | ORAL | 0 refills | Status: DC
  Filled 2023-07-30: qty 90, 90d supply, fill #0

## 2023-07-30 MED ORDER — CYCLOBENZAPRINE HCL 10 MG PO TABS
10.0000 mg | ORAL_TABLET | Freq: Every evening | ORAL | 0 refills | Status: AC | PRN
  Filled 2023-07-30: qty 15, 15d supply, fill #0

## 2023-07-30 MED ORDER — PREDNISONE 20 MG PO TABS
40.0000 mg | ORAL_TABLET | Freq: Every day | ORAL | 0 refills | Status: DC
  Filled 2023-07-30: qty 9, 6d supply, fill #0

## 2023-07-30 NOTE — Progress Notes (Signed)
Subjective:    Patient ID: Bradley Schaefer, male    DOB: 1980-12-30, 42 y.o.   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 18 g 2   cetirizine (ZYRTEC) 10 MG tablet Take 10 mg by mouth daily as needed for allergies.     esomeprazole (NEXIUM) 40 MG capsule Take 1 capsule (40 mg total) by mouth daily. 90 capsule 0   fluticasone-salmeterol (ADVAIR DISKUS) 250-50 MCG/ACT AEPB Inhale 1 puff into the lungs in the morning and at bedtime. 60 each 5   losartan (COZAAR) 50 MG tablet Take 1 tablet (50 mg total) by mouth 2 (two) times daily. 180 tablet 0   tiZANidine (ZANAFLEX) 4 MG capsule Take 1 capsule (4 mg total) by mouth 3 (three) times daily as needed for muscle spasms. 90 capsule 0   valACYclovir (VALTREX) 1000 MG tablet Take 1 tablet (1,000 mg total) by mouth daily. 90 tablet 0   No current facility-administered medications on file prior to visit.    No Known Allergies  Past Medical History:  Diagnosis Date   ADD (attention deficit disorder)    Asthma    Dislocation of left patella 12/22/2018   GERD (gastroesophageal reflux disease)    Herpes simplex    Hypertension    Varicose veins     Past Surgical History:  Procedure Laterality Date   ENDOVENOUS ABLATION SAPHENOUS VEIN W/ LASER Left 06/19/2020   endovenous laser ablation left greater saphenous  vein and stab phlebectomy > 20 incisions left leg by Fabienne Bruns MD    TYMPANOSTOMY TUBE PLACEMENT     VASECTOMY  08/02/2022   [facility]    Family History  Problem Relation Age of Onset   Crohn's disease Mother    Diabetes Mother    Squamous cell carcinoma Mother    Basal cell carcinoma Mother    Hypertension Father    GER disease Father    Asthma Father    Lung disease Maternal Grandmother        Sarcodosis   Brain cancer Maternal Grandfather 51   Lung cancer Paternal Grandmother    Bladder Cancer Paternal Grandfather    Colon cancer Neg Hx    Stomach cancer Neg Hx    Pancreatic cancer Neg Hx     Social History   Socioeconomic History   Marital status: Married    Spouse name: Baxter Hire   Number of children: 3   Years of education: Not on file   Highest education level: Not on file  Occupational History   Not on file  Tobacco Use   Smoking status: Never   Smokeless tobacco: Never  Vaping Use   Vaping status: Never Used  Substance and Sexual Activity   Alcohol use: Yes    Comment: 2-3 typicall day, weekends 6-10   Drug use: No  Sexual activity: Yes    Birth control/protection: I.U.D.    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      Family: son - Will (2007) - gets him about 50% of the time, Baxter Hire has 2 children as well, parents nearby with a good relationship      Enjoys: spend time with wife, go the beach, jeep activities      Exercise: just busy life   Diet: typically 1 meal per day      Safety   Seat belts: Yes    Guns: No   Safe in relationships: Yes    Social Determinants of Corporate investment banker Strain: Not on file  Food Insecurity: Not on file  Transportation Needs: Not on file  Physical  Activity: Not on file  Stress: Not on file  Social Connections: Not on file  Intimate Partner Violence: Not on file   Review of Systems Also feels in traps--but not shoulders or arms No arm weakness    Objective:   Physical Exam Neck:     Comments: Mild tightness in neck and traps Restricted ROM in all neck directions---especially rotation and tilt Musculoskeletal:     Comments: Normal ROM in shoulders  Neurological:     Comments: No arm weakness            Assessment & Plan:

## 2023-07-30 NOTE — Assessment & Plan Note (Signed)
No worrisome findings Discussed heat wrap and patches (for while at work) Continue ibuprofen Trial cyclobenzaprine at night Prednisone 40 x 3, 20 x 3 to see if reduced inflammation gives symptomatic relief

## 2023-08-13 ENCOUNTER — Other Ambulatory Visit: Payer: Self-pay | Admitting: Primary Care

## 2023-08-13 ENCOUNTER — Other Ambulatory Visit: Payer: Self-pay

## 2023-08-14 MED FILL — Fluticasone-Salmeterol Aer Powder BA 250-50 MCG/ACT: RESPIRATORY_TRACT | 30 days supply | Qty: 60 | Fill #0 | Status: AC

## 2023-08-15 ENCOUNTER — Other Ambulatory Visit: Payer: Self-pay

## 2023-08-16 ENCOUNTER — Other Ambulatory Visit: Payer: Self-pay

## 2023-09-09 DIAGNOSIS — G4733 Obstructive sleep apnea (adult) (pediatric): Secondary | ICD-10-CM | POA: Diagnosis not present

## 2023-09-17 ENCOUNTER — Telehealth (INDEPENDENT_AMBULATORY_CARE_PROVIDER_SITE_OTHER): Payer: Commercial Managed Care - PPO | Admitting: Primary Care

## 2023-09-17 DIAGNOSIS — G4733 Obstructive sleep apnea (adult) (pediatric): Secondary | ICD-10-CM | POA: Diagnosis not present

## 2023-09-17 NOTE — Progress Notes (Signed)
Virtual Visit via Video Note  I connected with Bradley Schaefer on 09/17/23 at  1:30 PM EST by a video enabled telemedicine application and verified that I am speaking with the correct person using two identifiers.  Location: Patient: Home Provider: Office    I discussed the limitations of evaluation and management by telemedicine and the availability of in person appointments. The patient expressed understanding and agreed to proceed.  History of Present Illness: 42 year old male, never smoked. PMH significant for HTN, mild intermittent asthma, ADHD.   Previous [facility] pulmonary encounter:  11/07/2021 Patient presents today for sleep consult. He has symptoms of loud snoring, witnessed apnea, restless sleep and daytime sleepiness. Symptoms are worse since having covid in March 2022. He works day shift as a Engineer, civil (consulting). He had a colonoscopy recently and had issues to apnea. Typical bedtime is between 10pm-12am. He wakes up on average 5-10 times a night.   He saw his PCP on 10/03/21 for mild intermittent asthma symptoms, treated with 6 day course of prednisone. Maintained on Advair 1 puff twice daily and cetirizine. He has noticed increased shortness of breath as well since having covid. He uses his albuterol rescue inhaler 5-6 times a week.   Sleep questionnaire: Prior sleep study- None Symptoms- loud snoring, choking, stops breathing during sleep, restless sleep, daytime sleepiness Bedtime- 10pm-12am  Nocturnal awakenings- 5-10 times Out of bed- 5:30am/ 8-10am Weight- up 10 lbs  Epworth- 9  12/31/2021 Patient presents today to review sleep study results/Pulmonary function testing. Patient had sleep study with [facility] on 11/27/21 which showed moderate-severe OSA, AHI 26.6/hr with SpO2 low 75%. Discussed risk of untreated sleep apnea and treatment options. He is open to starting CPAP since his sleep has worsened and he continues to have daytime fatigue. He is maintained on Adviar 250-21mcg one  puffs morning and evening for asthma. PFTs were normal. Feels higher dose ICS inhaler has helps his breathing, not having as much shortness of breath and using Albuterol less.    09/11/2022 Patient presents today for 6 month follow-up.  He is doing well without acute complaints. Fatigue and sleep have improved since starting CPAP. He feels his energy level is back to baseline. He is having no issues with mask fit or pressure settings. CPAP currently on auto settings 5-15cm h20. He uses nasal mask.  He works 12 hour day shift. He is getting 7.5 hours of usage. He is using nasal pillow mask. DME is [employer]  Airview download 08/11/22-09/09/22 Usage 30/30 days; 29 days (97%) > 4 hours  Average usage 7 hours 32 mins Pressure 5-15cm h20 (10.9cm h20-95%) Airleaks 2L/min (95%) AHI 1.3  09/17/2023- Interim hx  Patient contacted today for 1 year follow-up OSA. He remains compliant with CPAP and reports benefit. No issues with mask fit or pressure settings.  He is inquiring about travel CPAP machine for when he is camping. No changes recommended today.  Airview download 08/17/2023 - 09/15/2023 30/30 days (100%); 29 days (97%) greater than 4 hours Average usage 7 hours 54 minutes Pressure 5 to 15 cm H2O (11.4cm h20-95%) Air leaks 2.6 L/min (95%) AHI 0.9   Observations/Objective:  Appears well without overt respiratory symptoms  Assessment and Plan:  Moderate OSA - Home sleep on 11/27/21 showed moderate-severe OSA, AHI 26.6/h - He is 97% compliant with use last 30 days >4 hours. No issues with mask fit or pressure settings. Reports improvement in fatigue and sleep quality with use.  - Current CPAP pressure 5-15cm h20  without residual apneas - No changes today, he will notify office he would like an order for travel CPAP machine  - Renew CPAP supplies x 1 year with [employer]   Follow Up Instructions:  1 year with [facility] NP or sooner if needed    I discussed the assessment and treatment plan with  the patient. The patient was provided an opportunity to ask questions and all were answered. The patient agreed with the plan and demonstrated an understanding of the instructions.   The patient was advised to call back or seek an in-person evaluation if the symptoms worsen or if the condition fails to improve as anticipated.  I provided 22 minutes of non-face-to-face time during this encounter.   Glenford Bayley, NP

## 2023-09-22 ENCOUNTER — Other Ambulatory Visit: Payer: Self-pay | Admitting: Nurse Practitioner

## 2023-09-22 ENCOUNTER — Other Ambulatory Visit: Payer: Self-pay

## 2023-09-22 MED ORDER — LOSARTAN POTASSIUM 50 MG PO TABS
50.0000 mg | ORAL_TABLET | Freq: Two times a day (BID) | ORAL | 0 refills | Status: DC
  Filled 2023-09-22: qty 180, 90d supply, fill #0

## 2023-09-22 MED FILL — Fluticasone-Salmeterol Aer Powder BA 250-50 MCG/ACT: RESPIRATORY_TRACT | 30 days supply | Qty: 60 | Fill #1 | Status: AC

## 2023-09-22 NOTE — Telephone Encounter (Signed)
Please call patient to set up cpe

## 2023-10-09 DIAGNOSIS — G4733 Obstructive sleep apnea (adult) (pediatric): Secondary | ICD-10-CM | POA: Diagnosis not present

## 2023-10-20 ENCOUNTER — Other Ambulatory Visit: Payer: Self-pay | Admitting: Primary Care

## 2023-10-21 ENCOUNTER — Other Ambulatory Visit: Payer: Self-pay

## 2023-10-21 ENCOUNTER — Other Ambulatory Visit: Payer: Self-pay | Admitting: Primary Care

## 2023-10-22 ENCOUNTER — Other Ambulatory Visit: Payer: Self-pay

## 2023-10-25 ENCOUNTER — Telehealth: Payer: Self-pay | Admitting: Primary Care

## 2023-10-25 NOTE — Telephone Encounter (Signed)
Pt calling in bc he refill request was denied for Advair and will like to know why

## 2023-10-26 ENCOUNTER — Other Ambulatory Visit: Payer: Self-pay | Admitting: Nurse Practitioner

## 2023-10-26 ENCOUNTER — Other Ambulatory Visit: Payer: Self-pay | Admitting: Internal Medicine

## 2023-10-26 ENCOUNTER — Other Ambulatory Visit: Payer: Self-pay

## 2023-10-26 ENCOUNTER — Other Ambulatory Visit: Payer: Self-pay | Admitting: Primary Care

## 2023-10-26 DIAGNOSIS — K219 Gastro-esophageal reflux disease without esophagitis: Secondary | ICD-10-CM

## 2023-10-26 MED ORDER — ESOMEPRAZOLE MAGNESIUM 40 MG PO CPDR
40.0000 mg | DELAYED_RELEASE_CAPSULE | Freq: Every day | ORAL | 0 refills | Status: DC
  Filled 2023-10-26: qty 90, 90d supply, fill #0

## 2023-10-28 ENCOUNTER — Other Ambulatory Visit: Payer: Self-pay

## 2023-10-28 MED ORDER — FLUTICASONE-SALMETEROL 250-50 MCG/ACT IN AEPB: 1.0000 | INHALATION_SPRAY | Freq: Two times a day (BID) | RESPIRATORY_TRACT | 11 refills | Status: DC

## 2023-10-28 MED FILL — Albuterol Sulfate Inhal Aero 108 MCG/ACT (90MCG Base Equiv): RESPIRATORY_TRACT | 25 days supply | Qty: 6.7 | Fill #0 | Status: AC

## 2023-10-29 ENCOUNTER — Other Ambulatory Visit: Payer: Self-pay

## 2023-10-29 MED ORDER — FLUTICASONE-SALMETEROL 250-50 MCG/ACT IN AEPB
1.0000 | INHALATION_SPRAY | Freq: Two times a day (BID) | RESPIRATORY_TRACT | 11 refills | Status: DC
  Filled 2023-10-29: qty 60, 30d supply, fill #0
  Filled 2024-05-19 – 2024-05-24 (×2): qty 60, 30d supply, fill #1
  Filled 2024-09-02: qty 60, 30d supply, fill #2

## 2023-10-29 NOTE — Telephone Encounter (Signed)
Action Created on Order Mode Entered by Comment Responsible Provider Signed by Signed on  Ordering 10/28/23 1526 Verbal with Read Back: Cosign Required Ellin Saba, Ledell Peoples, RN  [name], Virgel Bouquet, NP [name], Virgel Bouquet, NP 10/28/23 1624   Advair was refilled

## 2023-11-02 ENCOUNTER — Other Ambulatory Visit: Payer: Self-pay

## 2023-11-02 MED FILL — Fluticasone-Salmeterol Aer Powder BA 250-50 MCG/ACT: RESPIRATORY_TRACT | 30 days supply | Qty: 60 | Fill #0 | Status: CN

## 2023-11-09 DIAGNOSIS — G4733 Obstructive sleep apnea (adult) (pediatric): Secondary | ICD-10-CM | POA: Diagnosis not present

## 2023-11-23 ENCOUNTER — Other Ambulatory Visit: Payer: Self-pay

## 2023-11-23 MED FILL — Fluticasone-Salmeterol Aer Powder BA 250-50 MCG/ACT: RESPIRATORY_TRACT | 30 days supply | Qty: 60 | Fill #0 | Status: AC

## 2023-12-21 ENCOUNTER — Other Ambulatory Visit: Payer: Self-pay

## 2023-12-21 ENCOUNTER — Other Ambulatory Visit: Payer: Self-pay | Admitting: Nurse Practitioner

## 2023-12-21 MED FILL — Fluticasone-Salmeterol Aer Powder BA 250-50 MCG/ACT: RESPIRATORY_TRACT | 30 days supply | Qty: 60 | Fill #1 | Status: AC

## 2023-12-22 ENCOUNTER — Other Ambulatory Visit: Payer: Self-pay

## 2023-12-22 MED FILL — Losartan Potassium Tab 50 MG: ORAL | 30 days supply | Qty: 60 | Fill #0 | Status: AC

## 2023-12-22 NOTE — Telephone Encounter (Signed)
Patient needs a CPE in the next 30 days to continue getting refills

## 2023-12-23 ENCOUNTER — Other Ambulatory Visit: Payer: Self-pay

## 2023-12-23 NOTE — Telephone Encounter (Signed)
 Call pt and schedule a appt for cpe

## 2023-12-30 ENCOUNTER — Encounter: Payer: Self-pay | Admitting: Nurse Practitioner

## 2023-12-30 DIAGNOSIS — R5383 Other fatigue: Secondary | ICD-10-CM

## 2023-12-30 DIAGNOSIS — E7800 Pure hypercholesterolemia, unspecified: Secondary | ICD-10-CM

## 2023-12-30 DIAGNOSIS — G4700 Insomnia, unspecified: Secondary | ICD-10-CM

## 2023-12-30 DIAGNOSIS — G4733 Obstructive sleep apnea (adult) (pediatric): Secondary | ICD-10-CM

## 2023-12-30 DIAGNOSIS — E669 Obesity, unspecified: Secondary | ICD-10-CM

## 2023-12-30 DIAGNOSIS — I10 Essential (primary) hypertension: Secondary | ICD-10-CM

## 2023-12-30 DIAGNOSIS — Z131 Encounter for screening for diabetes mellitus: Secondary | ICD-10-CM

## 2024-01-11 ENCOUNTER — Other Ambulatory Visit (INDEPENDENT_AMBULATORY_CARE_PROVIDER_SITE_OTHER)

## 2024-01-11 DIAGNOSIS — E669 Obesity, unspecified: Secondary | ICD-10-CM

## 2024-01-11 DIAGNOSIS — R5383 Other fatigue: Secondary | ICD-10-CM

## 2024-01-11 DIAGNOSIS — Z131 Encounter for screening for diabetes mellitus: Secondary | ICD-10-CM | POA: Diagnosis not present

## 2024-01-11 DIAGNOSIS — I10 Essential (primary) hypertension: Secondary | ICD-10-CM | POA: Diagnosis not present

## 2024-01-11 DIAGNOSIS — E7800 Pure hypercholesterolemia, unspecified: Secondary | ICD-10-CM

## 2024-01-11 LAB — COMPREHENSIVE METABOLIC PANEL
ALT: 31 U/L (ref 0–53)
AST: 23 U/L (ref 0–37)
Albumin: 4.3 g/dL (ref 3.5–5.2)
Alkaline Phosphatase: 62 U/L (ref 39–117)
BUN: 8 mg/dL (ref 6–23)
CO2: 31 meq/L (ref 19–32)
Calcium: 9.5 mg/dL (ref 8.4–10.5)
Chloride: 102 meq/L (ref 96–112)
Creatinine, Ser: 0.95 mg/dL (ref 0.40–1.50)
GFR: 97.29 mL/min (ref >60.00)
Glucose, Bld: 108 mg/dL — ABNORMAL HIGH (ref 70–99)
Potassium: 4.3 meq/L (ref 3.5–5.1)
Sodium: 138 meq/L (ref 135–145)
Total Bilirubin: 0.5 mg/dL (ref 0.2–1.2)
Total Protein: 7.1 g/dL (ref 6.0–8.3)

## 2024-01-11 LAB — CBC
HCT: 46.5 % (ref 39.0–52.0)
Hemoglobin: 16 g/dL (ref 13.0–17.0)
MCHC: 34.3 g/dL (ref 30.0–36.0)
MCV: 89.5 fl (ref 78.0–100.0)
Platelets: 263 10*3/uL (ref 150.0–400.0)
RBC: 5.2 Mil/uL (ref 4.22–5.81)
RDW: 13.7 % (ref 11.5–15.5)
WBC: 7 10*3/uL (ref 4.0–10.5)

## 2024-01-11 LAB — LIPID PANEL
Cholesterol: 185 mg/dL (ref 0–200)
HDL: 49.4 mg/dL (ref >39.00)
LDL Cholesterol: 114 mg/dL — ABNORMAL HIGH (ref 0–99)
NonHDL: 135.14
Total CHOL/HDL Ratio: 4
Triglycerides: 108 mg/dL (ref 0.0–149.0)
VLDL: 21.6 mg/dL (ref 0.0–40.0)

## 2024-01-11 LAB — VITAMIN D 25 HYDROXY (VIT D DEFICIENCY, FRACTURES): VITD: 40.9 ng/mL (ref 30.00–100.00)

## 2024-01-11 LAB — TESTOSTERONE: Testosterone: 259.91 ng/dL — ABNORMAL LOW (ref 300.00–890.00)

## 2024-01-11 LAB — HEMOGLOBIN A1C: Hgb A1c MFr Bld: 5.6 % (ref 4.6–6.5)

## 2024-01-11 LAB — VITAMIN B12: Vitamin B-12: 562 pg/mL (ref 211–911)

## 2024-01-11 LAB — TSH: TSH: 2.75 u[IU]/mL (ref 0.35–5.50)

## 2024-01-12 ENCOUNTER — Encounter: Payer: Self-pay | Admitting: Nurse Practitioner

## 2024-01-12 ENCOUNTER — Other Ambulatory Visit: Payer: Self-pay | Admitting: Nurse Practitioner

## 2024-01-12 DIAGNOSIS — R7989 Other specified abnormal findings of blood chemistry: Secondary | ICD-10-CM

## 2024-01-20 ENCOUNTER — Ambulatory Visit: Payer: Commercial Managed Care - PPO | Admitting: Nurse Practitioner

## 2024-01-24 ENCOUNTER — Other Ambulatory Visit: Payer: Self-pay

## 2024-01-24 ENCOUNTER — Other Ambulatory Visit: Payer: Self-pay | Admitting: Internal Medicine

## 2024-01-24 ENCOUNTER — Encounter: Payer: Self-pay | Admitting: Nurse Practitioner

## 2024-01-24 ENCOUNTER — Other Ambulatory Visit: Payer: Self-pay | Admitting: Nurse Practitioner

## 2024-01-24 DIAGNOSIS — K219 Gastro-esophageal reflux disease without esophagitis: Secondary | ICD-10-CM

## 2024-01-24 MED ORDER — ESOMEPRAZOLE MAGNESIUM 40 MG PO CPDR: 40.0000 mg | DELAYED_RELEASE_CAPSULE | Freq: Every day | ORAL | 0 refills | Status: DC

## 2024-01-24 MED ORDER — LOSARTAN POTASSIUM 50 MG PO TABS: 50.0000 mg | ORAL_TABLET | Freq: Two times a day (BID) | ORAL | 0 refills | Status: DC

## 2024-01-24 MED FILL — Fluticasone-Salmeterol Aer Powder BA 250-50 MCG/ACT: RESPIRATORY_TRACT | 30 days supply | Qty: 60 | Fill #2 | Status: AC

## 2024-01-26 ENCOUNTER — Telehealth: Payer: Self-pay

## 2024-01-26 NOTE — Telephone Encounter (Signed)
 Called patient per provider request to schedule follow up appointment to be seen in office no answer left message on voice mail to call office back.

## 2024-02-01 ENCOUNTER — Other Ambulatory Visit (INDEPENDENT_AMBULATORY_CARE_PROVIDER_SITE_OTHER)

## 2024-02-01 DIAGNOSIS — R7989 Other specified abnormal findings of blood chemistry: Secondary | ICD-10-CM

## 2024-02-01 LAB — TESTOSTERONE: Testosterone: 336.66 ng/dL (ref 300.00–890.00)

## 2024-02-02 ENCOUNTER — Encounter: Payer: Self-pay | Admitting: Nurse Practitioner

## 2024-02-08 ENCOUNTER — Other Ambulatory Visit: Payer: Self-pay

## 2024-02-08 ENCOUNTER — Ambulatory Visit: Admitting: Nurse Practitioner

## 2024-02-08 VITALS — BP 122/88 | HR 89 | Temp 99.0°F | Ht 67.0 in | Wt 198.6 lb

## 2024-02-08 DIAGNOSIS — R7989 Other specified abnormal findings of blood chemistry: Secondary | ICD-10-CM

## 2024-02-08 DIAGNOSIS — D229 Melanocytic nevi, unspecified: Secondary | ICD-10-CM

## 2024-02-08 DIAGNOSIS — K219 Gastro-esophageal reflux disease without esophagitis: Secondary | ICD-10-CM | POA: Diagnosis not present

## 2024-02-08 DIAGNOSIS — I10 Essential (primary) hypertension: Secondary | ICD-10-CM | POA: Diagnosis not present

## 2024-02-08 DIAGNOSIS — Z0000 Encounter for general adult medical examination without abnormal findings: Secondary | ICD-10-CM

## 2024-02-08 DIAGNOSIS — E7800 Pure hypercholesterolemia, unspecified: Secondary | ICD-10-CM

## 2024-02-08 DIAGNOSIS — G4733 Obstructive sleep apnea (adult) (pediatric): Secondary | ICD-10-CM

## 2024-02-08 DIAGNOSIS — E669 Obesity, unspecified: Secondary | ICD-10-CM

## 2024-02-08 MED ORDER — LOSARTAN POTASSIUM 50 MG PO TABS
50.0000 mg | ORAL_TABLET | Freq: Two times a day (BID) | ORAL | 3 refills | Status: AC
  Filled 2024-02-08 – 2024-02-21 (×2): qty 180, 90d supply, fill #0
  Filled 2024-05-19 – 2024-05-30 (×2): qty 180, 90d supply, fill #1
  Filled 2024-08-23: qty 180, 90d supply, fill #2
  Filled 2024-11-21 – 2024-11-30 (×2): qty 180, 90d supply, fill #3

## 2024-02-08 MED ORDER — ESOMEPRAZOLE MAGNESIUM 40 MG PO CPDR
40.0000 mg | DELAYED_RELEASE_CAPSULE | Freq: Every day | ORAL | 3 refills | Status: AC
  Filled 2024-02-08 – 2024-02-21 (×2): qty 90, 90d supply, fill #0
  Filled 2024-05-19 – 2024-05-30 (×2): qty 90, 90d supply, fill #1
  Filled 2024-08-23: qty 90, 90d supply, fill #2
  Filled 2024-11-21 – 2024-11-30 (×2): qty 90, 90d supply, fill #3

## 2024-02-08 NOTE — Assessment & Plan Note (Addendum)
 Patient 1 low reading of testosterone with a second reading was borderline.  Patient is interested in TRT will refer to urology.  [facility] will ask to Dr. Alvester Morin referral placed just in case he is not considered to patient and Clinical research associate

## 2024-02-08 NOTE — Assessment & Plan Note (Signed)
 A mole that is atypical per patient's spouse's report.  Feels like it is changed.  Ambulatory for dermatology for skin survey

## 2024-02-08 NOTE — Assessment & Plan Note (Signed)
 Patient currently maintained on losartan 50 mg 1 tablet twice daily.  Blood pressure well-controlled.  Refill provided today

## 2024-02-08 NOTE — Assessment & Plan Note (Signed)
 Discussed age-appropriate immunizations and screening exams.  Did review patient's personal, surgical, social, family histories.  Patient is up-to-date on all age-appropriate vaccinations he would like.  Patient up-to-date on CRC screening.  Patient seen for prostate cancer screening currently.  Patient was given information at discharge about preventative healthcare maintenance with anticipatory guidance

## 2024-02-08 NOTE — Assessment & Plan Note (Signed)
 Patient currently maintained on esomeprazole 40 mg daily.  Most recent endoscopy was 2022 with negative biopsies.  Continue medication as prescribed

## 2024-02-08 NOTE — Assessment & Plan Note (Signed)
 Reviewed patient's labs with him.  Continue work on lifestyle modifications

## 2024-02-08 NOTE — Assessment & Plan Note (Signed)
 Patient seen department with CPAP and getting good result.  He has follow-up pulmonology.  Continue using CPAP as directed and follow-up with pulmonology as recommended.

## 2024-02-08 NOTE — Assessment & Plan Note (Signed)
 History of the same.  Continue work on lifestyle modifications

## 2024-02-08 NOTE — Progress Notes (Signed)
 Established Patient Office Visit  Subjective   Patient ID: Bradley Schaefer, male    DOB: 14-Jun-1981  Age: 43 y.o. MRN: [id number]  Chief Complaint  Patient presents with   testosterone level    Pt complains of having low energy and gaining weight unintentionally. Ongoing for about a year. Last time his levels were checked pt states they were normal but low. Pt would like to discuss treatment plans.    HPI  HTN: patient is currently on losartan 50mg  daily. He will check it sometime and it will be 120s/70s   Ashtma: albuterol and advair. He is using the albuterol infrequently. States that he does not have an PND. He is followed by pulmonology. Last visit was 09/17/2023  OSA: he is wearing his CPAP. He is getting 6-7.5. States that he is feeling some restored. States that he feels better than he did prior to CPAP therapy. He is not having motivation nor getting going. CPAP is managed by pulmonology   States that sex drive is fine. He is having some weight gain. States that on his days off he did not have much energy even to do the needed tasks   GERD: currenly on esomeprazole 40mg . Endoscopy 2022 biopsy normal   for complete physical and follow up of chronic conditions.  Immunizations: -Tetanus: Completed in 2024 -Influenza: utd through employer -Shingles: too young  -Pneumonia: too young    Diet: Fair diet. Eating once a day. He iwll snacks sometimes at work. He does cook at home. He is doing coffee in the am and then he will do a soda when he works. He will do water or gatorade  Exercise:  He is getting 10K steps a day. State that he is working out 3-4 times a week since the beginning of the year   Eye exam: Completes annually. Contacts. Yearly Dental exam: Completes semi-annually    Colonoscopy: Completed in 09/29/2021, repeat in 3 years  Lung Cancer Screening: NA  PSA: too young, currently average risk      02/08/2024    8:49 AM 09/09/2022    4:17 PM 07/24/2021   11:21 AM   PHQ9 SCORE ONLY  PHQ-9 Total Score 2 0 0       02/08/2024    8:49 AM  GAD 7 : Generalized Anxiety Score  Nervous, Anxious, on Edge 0  Control/stop worrying 0  Worry too much - different things 0  Trouble relaxing 0  Restless 0  Easily annoyed or irritable 0  Afraid - awful might happen 0  Total GAD 7 Score 0  Anxiety Difficulty Not difficult at all        Review of Systems  Constitutional:  Negative for chills and fever.  Respiratory:  Negative for shortness of breath.   Cardiovascular:  Negative for chest pain and leg swelling.  Gastrointestinal:  Negative for abdominal pain, blood in stool, constipation, diarrhea, nausea and vomiting.       BM daily   Genitourinary:  Negative for dysuria and hematuria.  Neurological:  Negative for tingling and headaches.  Psychiatric/Behavioral:  Negative for hallucinations and suicidal ideas.       Objective:     BP 122/88   Pulse 89   Temp 99 F (37.2 C) (Oral)   Ht 5\' 7"  (1.702 m)   Wt 198 lb 9.6 oz (90.1 kg)   SpO2 93%   BMI 31.11 kg/m  BP Readings from Last 3 Encounters:  02/08/24 122/88  07/30/23 120/84  04/25/23 Marland Kitchen)  145/88   Wt Readings from Last 3 Encounters:  02/08/24 198 lb 9.6 oz (90.1 kg)  07/30/23 200 lb (90.7 kg)  09/11/22 195 lb 6.4 oz (88.6 kg)   SpO2 Readings from Last 3 Encounters:  02/08/24 93%  07/30/23 96%  04/25/23 98%      Physical Exam Vitals and nursing note reviewed.  Constitutional:      Appearance: Normal appearance.  HENT:     Right Ear: Tympanic membrane, ear canal and external ear normal.     Left Ear: Tympanic membrane, ear canal and external ear normal.     Mouth/Throat:     Mouth: Mucous membranes are moist.     Pharynx: Oropharynx is clear.  Eyes:     Extraocular Movements: Extraocular movements intact.     Pupils: Pupils are equal, round, and reactive to light.  Cardiovascular:     Rate and Rhythm: Normal rate and regular rhythm.     Pulses: Normal pulses.     Heart  sounds: Normal heart sounds.  Pulmonary:     Effort: Pulmonary effort is normal.     Breath sounds: Normal breath sounds.  Abdominal:     General: Bowel sounds are normal. There is no distension.     Palpations: There is no mass.     Tenderness: There is no abdominal tenderness.     Hernia: No hernia is present.  Genitourinary:    Comments: Deferred  Musculoskeletal:     Right lower leg: No edema.     Left lower leg: No edema.  Lymphadenopathy:     Cervical: No cervical adenopathy.  Skin:    General: Skin is warm.  Neurological:     General: No focal deficit present.     Mental Status: He is alert.     Deep Tendon Reflexes:     Reflex Scores:      Bicep reflexes are 2+ on the right side and 2+ on the left side.      Patellar reflexes are 2+ on the right side and 2+ on the left side.    Comments: Bilateral upper and lower extremity strength 5/5  Psychiatric:        Mood and Affect: Mood normal.        Behavior: Behavior normal.        Thought Content: Thought content normal.        Judgment: Judgment normal.      No results found for any visits on 02/08/24.    The 10-year ASCVD risk score (Arnett DK, et al., 2019) is: 1.7%    Assessment & Plan:   Problem List Items Addressed This Visit       Cardiovascular and Mediastinum   Essential hypertension   Patient currently maintained on losartan 50 mg 1 tablet twice daily.  Blood pressure well-controlled.  Refill provided today      Relevant Medications   losartan (COZAAR) 50 MG tablet     Respiratory   OSA (obstructive sleep apnea)   Patient seen department with CPAP and getting good result.  He has follow-up pulmonology.  Continue using CPAP as directed and follow-up with pulmonology as recommended.        Digestive   Gastroesophageal reflux disease   Patient currently maintained on esomeprazole 40 mg daily.  Most recent endoscopy was 2022 with negative biopsies.  Continue medication as prescribed       Relevant Medications   esomeprazole (NEXIUM) 40 MG capsule     Other  Preventative health care - Primary   Discussed age-appropriate immunizations and screening exams.  Did review patient's personal, surgical, social, family histories.  Patient is up-to-date on all age-appropriate vaccinations he would like.  Patient up-to-date on CRC screening.  Patient seen for prostate cancer screening currently.  Patient was given information at discharge about preventative healthcare maintenance with anticipatory guidance      Obesity (BMI 30-39.9)   Reviewed patient's labs with him.  Continue work on lifestyle modifications      Elevated LDL cholesterol level   History of the same.  Continue work on lifestyle modifications      Atypical mole   A mole that is atypical per patient's spouse's report.  Feels like it is changed.  Ambulatory for dermatology for skin survey      Relevant Orders   Ambulatory referral to Dermatology   Low testosterone in male   Patient 1 low reading of testosterone with a second reading was borderline.  Patient is interested in TRT will refer to urology.  [facility] we will ask to Dr. Alvester Morin referral placed just in case he is not considered to patient and writer      Relevant Orders   Ambulatory referral to Urology    Return in about 1 year (around 02/07/2025) for CPE and Labs.    Audria Nine, NP

## 2024-02-15 ENCOUNTER — Other Ambulatory Visit: Payer: Self-pay

## 2024-02-15 DIAGNOSIS — E291 Testicular hypofunction: Secondary | ICD-10-CM | POA: Diagnosis not present

## 2024-02-15 DIAGNOSIS — Z125 Encounter for screening for malignant neoplasm of prostate: Secondary | ICD-10-CM | POA: Diagnosis not present

## 2024-02-15 MED ORDER — TESTOSTERONE CYPIONATE 200 MG/ML IM SOLN
60.0000 mg | INTRAMUSCULAR | 1 refills | Status: DC
  Filled 2024-02-15: qty 10, 35d supply, fill #0
  Filled 2024-02-22 – 2024-02-23 (×2): qty 8, 28d supply, fill #0
  Filled 2024-03-19 – 2024-03-21 (×2): qty 8, 28d supply, fill #1
  Filled 2024-04-19: qty 4, 14d supply, fill #2

## 2024-02-21 ENCOUNTER — Other Ambulatory Visit: Payer: Self-pay

## 2024-02-21 MED FILL — Albuterol Sulfate Inhal Aero 108 MCG/ACT (90MCG Base Equiv): RESPIRATORY_TRACT | 68 days supply | Qty: 18 | Fill #1 | Status: AC

## 2024-02-21 MED FILL — Fluticasone-Salmeterol Aer Powder BA 250-50 MCG/ACT: RESPIRATORY_TRACT | 30 days supply | Qty: 60 | Fill #3 | Status: AC

## 2024-02-22 ENCOUNTER — Other Ambulatory Visit: Payer: Self-pay

## 2024-02-23 ENCOUNTER — Other Ambulatory Visit: Payer: Self-pay

## 2024-02-24 ENCOUNTER — Other Ambulatory Visit: Payer: Self-pay

## 2024-03-08 DIAGNOSIS — G4733 Obstructive sleep apnea (adult) (pediatric): Secondary | ICD-10-CM | POA: Diagnosis not present

## 2024-03-15 ENCOUNTER — Encounter: Payer: Commercial Managed Care - PPO | Admitting: Nurse Practitioner

## 2024-03-20 ENCOUNTER — Other Ambulatory Visit: Payer: Self-pay

## 2024-03-20 MED FILL — Fluticasone-Salmeterol Aer Powder BA 250-50 MCG/ACT: RESPIRATORY_TRACT | 30 days supply | Qty: 60 | Fill #4 | Status: AC

## 2024-04-19 ENCOUNTER — Other Ambulatory Visit: Payer: Self-pay

## 2024-04-30 ENCOUNTER — Other Ambulatory Visit: Payer: Self-pay

## 2024-04-30 MED FILL — Fluticasone-Salmeterol Aer Powder BA 250-50 MCG/ACT: RESPIRATORY_TRACT | 30 days supply | Qty: 60 | Fill #5 | Status: AC

## 2024-05-01 ENCOUNTER — Other Ambulatory Visit: Payer: Self-pay

## 2024-05-01 MED ORDER — TESTOSTERONE CYPIONATE 200 MG/ML IM SOLN
INTRAMUSCULAR | 1 refills | Status: DC
  Filled 2024-05-01 – 2024-05-04 (×2): qty 8, 28d supply, fill #0
  Filled 2024-05-29 – 2024-06-07 (×4): qty 8, 28d supply, fill #1
  Filled 2024-07-04: qty 8, 28d supply, fill #2

## 2024-05-04 ENCOUNTER — Other Ambulatory Visit: Payer: Self-pay

## 2024-05-10 ENCOUNTER — Telehealth: Admitting: Physician Assistant

## 2024-05-10 ENCOUNTER — Other Ambulatory Visit (HOSPITAL_COMMUNITY): Payer: Self-pay

## 2024-05-10 DIAGNOSIS — H66009 Acute suppurative otitis media without spontaneous rupture of ear drum, unspecified ear: Secondary | ICD-10-CM

## 2024-05-10 DIAGNOSIS — H6993 Unspecified Eustachian tube disorder, bilateral: Secondary | ICD-10-CM

## 2024-05-10 MED ORDER — IPRATROPIUM BROMIDE 0.03 % NA SOLN
2.0000 | Freq: Two times a day (BID) | NASAL | 0 refills | Status: AC
  Filled 2024-05-10: qty 30, 75d supply, fill #0

## 2024-05-10 NOTE — Progress Notes (Signed)
 I have spent 5 minutes in review of e-visit questionnaire, review and updating patient chart, medical decision making and response to patient.   Piedad Climes, PA-C

## 2024-05-10 NOTE — Progress Notes (Signed)

## 2024-05-12 ENCOUNTER — Other Ambulatory Visit (HOSPITAL_COMMUNITY): Payer: Self-pay

## 2024-05-12 ENCOUNTER — Other Ambulatory Visit (HOSPITAL_BASED_OUTPATIENT_CLINIC_OR_DEPARTMENT_OTHER): Payer: Self-pay

## 2024-05-12 ENCOUNTER — Other Ambulatory Visit: Payer: Self-pay

## 2024-05-12 MED ORDER — AMOXICILLIN-POT CLAVULANATE 875-125 MG PO TABS
1.0000 | ORAL_TABLET | Freq: Two times a day (BID) | ORAL | 0 refills | Status: DC
  Filled 2024-05-12 (×2): qty 20, 10d supply, fill #0

## 2024-05-12 NOTE — Addendum Note (Signed)
 Addended by: [name] HERO on: 05/12/2024 07:08 AM   Modules accepted: Orders

## 2024-05-15 DIAGNOSIS — E291 Testicular hypofunction: Secondary | ICD-10-CM | POA: Diagnosis not present

## 2024-05-15 DIAGNOSIS — Z125 Encounter for screening for malignant neoplasm of prostate: Secondary | ICD-10-CM | POA: Diagnosis not present

## 2024-05-17 ENCOUNTER — Ambulatory Visit: Admitting: Internal Medicine

## 2024-05-19 ENCOUNTER — Other Ambulatory Visit: Payer: Self-pay

## 2024-05-22 DIAGNOSIS — E291 Testicular hypofunction: Secondary | ICD-10-CM | POA: Diagnosis not present

## 2024-05-29 ENCOUNTER — Other Ambulatory Visit: Payer: Self-pay

## 2024-05-30 ENCOUNTER — Other Ambulatory Visit: Payer: Self-pay

## 2024-06-01 ENCOUNTER — Other Ambulatory Visit: Payer: Self-pay

## 2024-06-02 ENCOUNTER — Other Ambulatory Visit: Payer: Self-pay

## 2024-06-08 ENCOUNTER — Other Ambulatory Visit: Payer: Self-pay

## 2024-06-09 DIAGNOSIS — G4733 Obstructive sleep apnea (adult) (pediatric): Secondary | ICD-10-CM | POA: Diagnosis not present

## 2024-06-12 DIAGNOSIS — E291 Testicular hypofunction: Secondary | ICD-10-CM | POA: Diagnosis not present

## 2024-07-05 ENCOUNTER — Ambulatory Visit: Admitting: Internal Medicine

## 2024-07-05 ENCOUNTER — Other Ambulatory Visit: Payer: Self-pay

## 2024-07-05 ENCOUNTER — Encounter: Payer: Self-pay | Admitting: Internal Medicine

## 2024-07-05 VITALS — BP 122/80 | HR 83 | Ht 67.0 in | Wt 203.0 lb

## 2024-07-05 DIAGNOSIS — Z860100 Personal history of colon polyps, unspecified: Secondary | ICD-10-CM

## 2024-07-05 DIAGNOSIS — K219 Gastro-esophageal reflux disease without esophagitis: Secondary | ICD-10-CM

## 2024-07-05 DIAGNOSIS — Z8719 Personal history of other diseases of the digestive system: Secondary | ICD-10-CM | POA: Diagnosis not present

## 2024-07-05 MED ORDER — SUTAB 1479-225-188 MG PO TABS
24.0000 | ORAL_TABLET | ORAL | 0 refills | Status: DC
  Filled 2024-07-05: qty 24, 1d supply, fill #0

## 2024-07-05 MED ORDER — TESTOSTERONE CYPIONATE 200 MG/ML IM SOLN
60.0000 mg | INTRAMUSCULAR | 1 refills | Status: AC
  Filled 2024-07-07 (×2): qty 3, 10d supply, fill #0
  Filled 2024-07-07: qty 3, 30d supply, fill #0
  Filled 2024-07-07: qty 5, 81d supply, fill #0
  Filled 2024-07-07: qty 5, 18d supply, fill #0
  Filled 2024-08-08: qty 12, 42d supply, fill #1

## 2024-07-05 MED FILL — Fluticasone-Salmeterol Aer Powder BA 250-50 MCG/ACT: RESPIRATORY_TRACT | 30 days supply | Qty: 60 | Fill #6 | Status: AC

## 2024-07-05 NOTE — Patient Instructions (Addendum)
 _______________________________________________________  If your blood pressure at your visit was 140/90 or greater, please contact your primary care physician to follow up on this.  _______________________________________________________  If you are age 45 or older, your body mass index should be between 23-30. Your Body mass index is 31.79 kg/m. If this is out of the aforementioned range listed, please consider follow up with your Primary Care Provider.  If you are age 29 or younger, your body mass index should be between 19-25. Your Body mass index is 31.79 kg/m. If this is out of the aformentioned range listed, please consider follow up with your Primary Care Provider.   ________________________________________________________  The [facility] providers would like to encourage you to use MYCHART to communicate with providers for non-urgent requests or questions.  Due to long hold times on the telephone, sending your provider a message by [facility] may be a faster and more efficient way to get a response.  Please allow 48 business hours for a response.  Please remember that this is for non-urgent requests.  _______________________________________________________  [facility] is using a team-based approach to care.  Your team is made up of your doctor and two to three APPS. Our APPS (Nurse Practitioners and Physician Assistants) work with your physician to ensure care continuity for you. They are fully qualified to address your health concerns and develop a treatment plan. They communicate directly with your gastroenterologist to care for you. Seeing the Advanced Practice Practitioners on your physician's team can help you by facilitating care more promptly, often allowing for earlier appointments, access to diagnostic testing, procedures, and other specialty referrals.   Please follow up in 12 months. Give us  a call at [phone] to schedule an appointment.  We have sent the  following medications to your pharmacy for you to pick up at your convenience: Sutab   Continue Nexium   You have been scheduled for a colonoscopy. Please follow written instructions given to you at your visit today.   If you use inhalers (even only as needed), please bring them with you on the day of your procedure.  DO NOT TAKE 7 DAYS PRIOR TO TEST- Trulicity (dulaglutide) Ozempic, Wegovy (semaglutide) Mounjaro (tirzepatide) Bydureon Bcise (exanatide extended release)  DO NOT TAKE 1 DAY PRIOR TO YOUR TEST Rybelsus (semaglutide) Adlyxin (lixisenatide) Victoza (liraglutide) Byetta (exanatide) ___________________________________________________________________________  It was a pleasure to see you today!  Thank you for trusting me with your gastrointestinal care!

## 2024-07-05 NOTE — Progress Notes (Signed)
 Chief Complaint: GERD  HPI : 43 year old male with history of asthma, GERD, ADHD, and HTN presents with follow up of GERD  He works as a Nurse, mental health at [employer].   Interval History: His acid reflux has been under fairly good control on the once a daily Nexium . He does note that the reflux seems to flare up when he eats certain foods, such as spicy foods.  He does occasionally see rectal bleeding but this is better than it has been in the past. He is having on 2-3 BMs per day. This is overall improved from prior. BMs are normal and sometimes loose. Fiber intake seems to help the diarrhea. Denies nocturnal stools.  Wt Readings from Last 3 Encounters:  07/05/24 203 lb (92.1 kg)  02/08/24 198 lb 9.6 oz (90.1 kg)  07/30/23 200 lb (90.7 kg)   Current Outpatient Medications  Medication Sig Dispense Refill   albuterol  (VENTOLIN  HFA) 108 (90 Base) MCG/ACT inhaler Inhale 2 puffs into the lungs every 6 (six) hours as needed for wheezing or shortness of breath. 18 g 2   amoxicillin -clavulanate (AUGMENTIN ) 875-125 MG tablet Take 1 tablet by mouth 2 (two) times daily. 20 tablet 0   cetirizine (ZYRTEC) 10 MG tablet Take 10 mg by mouth daily as needed for allergies.     cyclobenzaprine  (FLEXERIL ) 10 MG tablet Take 1 tablet (10 mg total) by mouth at bedtime as needed for muscle spasms. 15 tablet 0   esomeprazole  (NEXIUM ) 40 MG capsule Take 1 capsule (40 mg total) by mouth daily. 90 capsule 3   fluticasone -salmeterol (ADVAIR ) 250-50 MCG/ACT AEPB Inhale 1 puff into the lungs every 12 (twelve) hours. 60 each 11   fluticasone -salmeterol (WIXELA INHUB ) 250-50 MCG/ACT AEPB Inhale 1 puff into the lungs every 12 (twelve) hours. 60 each 11   ipratropium (ATROVENT ) 0.03 % nasal spray Place 2 sprays into both nostrils every 12 (twelve) hours. 30 mL 0   losartan  (COZAAR ) 50 MG tablet Take 1 tablet (50 mg total) by mouth 2 (two) times daily. 180 tablet 3   testosterone  cypionate (DEPOTESTOSTERONE CYPIONATE)  200 MG/ML injection Inject 0.3 mL intramuscularly twice weekly on Sunday and Wednesday 10 mL 1   tiZANidine  (ZANAFLEX ) 4 MG capsule Take 1 capsule (4 mg total) by mouth 3 (three) times daily as needed for muscle spasms. 90 capsule 0   valACYclovir  (VALTREX ) 1000 MG tablet Take 1 tablet (1,000 mg total) by mouth daily. 90 tablet 0   fluticasone -salmeterol (ADVAIR ) 250-50 MCG/ACT AEPB Inhale 1 puff into the lungs in the morning and at bedtime. (Patient not taking: Reported on 07/05/2024) 60 each 11   No current facility-administered medications for this visit.   Review of Systems: All systems reviewed and negative except where noted in HPI.   Physical Exam: BP 122/80   Pulse 83   Ht 5' 7 (1.702 m)   Wt 203 lb (92.1 kg)   BMI 31.79 kg/m  Constitutional: Pleasant,well-developed, male in no acute distress. HEENT: Normocephalic and atraumatic. Conjunctivae are normal. No scleral icterus. Cardiovascular: Normal rate, regular rhythm.  Pulmonary/chest: Effort normal and breath sounds normal. No wheezing, rales or rhonchi. Abdominal: Soft, nondistended, nontender. Bowel sounds active throughout. Extremities: No edema Neurological: Alert and oriented to person place and time. Skin: Skin is warm and dry. No rashes noted. Psychiatric: Normal mood and affect. Behavior is normal.  Labs 05/2020: TSH nml.  Labs 07/2021: Positive FOBT, giardia and crypto are negative. Fecal calprotectin negative. O&P negative. HCV antibody  negative. CMP nml. CBC nml. Celiac panel negative.  Labs 08/2021: Nml CRP  Labs 09/2021: GI profile positive for norovirus. C dif neg.  Labs 01/2024: CBC nml. CMP unremarkable. TSH nml. Vit D nml.  EGD 09/29/21: - The examined esophagus was normal. - A hiatal hernia was present. - Scattered inflammation characterized by adherent blood, erosions and erythema was found in the gastric body. Biopsies were taken with a cold forceps for histology. - The examined duodenum was normal.  Biopsies for histology were taken with a cold forceps for evaluation of celiac disease. Path: 1. Surgical [P], duodenum - DUODENAL MUCOSA WITH NO SPECIFIC HISTOPATHOLOGIC CHANGES - NEGATIVE FOR INCREASED INTRAEPITHELIAL LYMPHOCYTES OR VILLOUS ARCHITECTURAL CHANGES 2. Surgical [P], gastric - GASTRIC ANTRAL AND OXYNTIC MUCOSA WITH NO SPECIFIC HISTOPATHOLOGIC CHANGES - WARTHIN STARRY STAIN IS NEGATIVE FOR HELICOBACTER PYLORI  Colonoscopy 09/29/21: - Multiple small and large-mouthed diverticula were found in the entire colon. - A 2 mm polyp was found in the cecum. The polyp was sessile. The polyp was removed with a jumbo cold forceps. Resection and retrieval were complete. - Three sessile polyps were found in the transverse colon. The polyps were 3 to 5 mm in size. These polyps were removed with a cold snare. Resection and retrieval were complete. - Non-bleeding internal hemorrhoids were found during retroflexion. - Biopsies were taken with a cold forceps in the entire colon for histology. 3. 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 Surgical [P], colon nos, random sites - COLONIC MUCOSA WITH NO SPECIFIC HISTOPATHOLOGIC CHANGES - NEGATIVE FOR ACUTE INFLAMMATION, INCREASED INTRAEPITHELIAL LYMPHOCYTES OR THICKENED SUBEPITHELIAL COLLAGEN TABLE  ASSESSMENT AND PLAN: GERD History of gastritis Occasional loose stools History of colon polyps Patient's reflux has been doing well on daily PPI therapy. He will only have breakthrough reflux if he eats certain foods such as spicy foods. Diarrhea has also improved overall. 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 Patient is due for a colonoscopy for polyp surveillance later this year so will go ahead and get this scheduled. - Cont  Nexium  every day. Refill - Continue fiber - Colonoscopy in 09/2024 for polyp surveillance. Will schedule with a pod partner since I will be out on maternity leave. Sutab  prep.  - RTC 1 year  Estefana Kidney, MD  I spent 30 minutes of time, including in depth chart review, independent review of results as outlined above, communicating results with the patient directly, face-to-face time with the patient, coordinating care, and ordering studies and medications as appropriate, and documentation.

## 2024-07-07 ENCOUNTER — Other Ambulatory Visit: Payer: Self-pay

## 2024-07-26 ENCOUNTER — Other Ambulatory Visit: Payer: Self-pay

## 2024-07-27 DIAGNOSIS — D2371 Other benign neoplasm of skin of right lower limb, including hip: Secondary | ICD-10-CM | POA: Diagnosis not present

## 2024-08-04 MED FILL — Fluticasone-Salmeterol Aer Powder BA 250-50 MCG/ACT: RESPIRATORY_TRACT | 30 days supply | Qty: 60 | Fill #7 | Status: AC

## 2024-08-08 ENCOUNTER — Other Ambulatory Visit: Payer: Self-pay

## 2024-09-11 DIAGNOSIS — G4733 Obstructive sleep apnea (adult) (pediatric): Secondary | ICD-10-CM | POA: Diagnosis not present

## 2024-09-15 ENCOUNTER — Encounter: Payer: Self-pay | Admitting: Pediatrics

## 2024-09-20 NOTE — Progress Notes (Signed)
 [facility] History and Physical   Primary Care Physician:  Wendee Lynwood HERO, NP   Reason for Procedure:  History of adenomatous colon polyps  Plan:    Colonoscopy   The patient was provided an opportunity to ask questions and all were answered. The patient agreed with the plan.   HPI: Bradley Schaefer is a 43 y.o. male undergoing colonoscopy for surveillance of adenomatous colon polyps.  Patient underwent colonoscopy in 2022 which disclosed 4 subcentimeter tubular adenomas.  No family history of colorectal cancer or polyps.  Patient denies current symptoms of rectal bleeding or change in bowel habits.   Past Medical History:  Diagnosis Date   ADD (attention deficit disorder)    Asthma    Dislocation of left patella 12/22/2018   GERD (gastroesophageal reflux disease)    Herpes simplex    Hypertension    Sleep apnea 2022   Varicose veins     Past Surgical History:  Procedure Laterality Date   ENDOVENOUS ABLATION SAPHENOUS VEIN W/ LASER Left 06/19/2020   endovenous laser ablation left greater saphenous vein and stab phlebectomy > 20 incisions left leg by Carlin Haddock MD    TYMPANOSTOMY TUBE PLACEMENT     VASECTOMY  08/02/2022   Dr [name] with [facility]    Prior to Admission medications   Medication Sig Start Date End Date Taking? Authorizing Provider  albuterol  (VENTOLIN  HFA) 108 (90 Base) MCG/ACT inhaler Inhale 2 puffs into the lungs every 6 (six) hours as needed for wheezing or shortness of breath. 10/28/23   Wendee Lynwood HERO, NP  amoxicillin -clavulanate (AUGMENTIN ) 875-125 MG tablet Take 1 tablet by mouth 2 (two) times daily. 05/12/24   [name] HERO, PA-C  cetirizine (ZYRTEC) 10 MG tablet Take 10 mg by mouth daily as needed for allergies.    [provider]  cyclobenzaprine  (FLEXERIL ) 10 MG tablet Take 1 tablet (10 mg total) by mouth at bedtime as needed for muscle spasms. 07/30/23   [name], MD  esomeprazole  (NEXIUM ) 40 MG capsule Take  1 capsule (40 mg total) by mouth daily. 02/08/24   Wendee Lynwood HERO, NP  fluticasone -salmeterol (ADVAIR ) 250-50 MCG/ACT AEPB Inhale 1 puff into the lungs in the morning and at bedtime. Patient not taking: Reported on 07/05/2024 11/02/23   [name], NP  fluticasone -salmeterol (ADVAIR ) 250-50 MCG/ACT AEPB Inhale 1 puff into the lungs every 12 (twelve) hours. 10/28/23   [name], [name], NP  fluticasone -salmeterol (WIXELA INHUB ) 250-50 MCG/ACT AEPB Inhale 1 puff into the lungs every 12 (twelve) hours. 10/28/23     ipratropium (ATROVENT ) 0.03 % nasal spray Place 2 sprays into both nostrils every 12 (twelve) hours. 05/10/24   [name], PA-C  losartan  (COZAAR ) 50 MG tablet Take 1 tablet (50 mg total) by mouth 2 (two) times daily. 02/08/24   Wendee Lynwood HERO, NP  Sodium Sulfate-Mag Sulfate-KCl (SUTAB ) [id number] MG TABS Take 24 tablets by mouth as directed. For Colonoscopy 07/05/24   [name], MD  testosterone  cypionate (DEPOTESTOSTERONE CYPIONATE) 200 MG/ML injection Inject 0.3 mLs (60 mg total) into the muscle 2 (two) times a week on Sunday and Wednesday. 07/06/24     tiZANidine  (ZANAFLEX ) 4 MG capsule Take 1 capsule (4 mg total) by mouth 3 (three) times daily as needed for muscle spasms. 09/09/22   Wendee Lynwood HERO, NP  valACYclovir  (VALTREX ) 1000 MG tablet Take 1 tablet (1,000 mg total) by mouth daily. 07/24/21   Velma Raisin, MD    Current Outpatient Medications  Medication Sig Dispense Refill   esomeprazole  (NEXIUM ) 40 MG capsule Take 1 capsule (40 mg total) by mouth daily. 90 capsule 3   fluticasone -salmeterol (ADVAIR ) 250-50 MCG/ACT AEPB Inhale 1 puff into the lungs in the morning and at bedtime. 60 each 11   losartan  (COZAAR ) 50 MG tablet Take 1 tablet (50 mg total) by mouth 2 (two) times daily. 180 tablet 3   testosterone  cypionate (DEPOTESTOSTERONE CYPIONATE) 200 MG/ML injection Inject 0.3 mLs (60 mg total) into the muscle 2 (two) times a week on Sunday and Wednesday. 10 mL 1    albuterol  (VENTOLIN  HFA) 108 (90 Base) MCG/ACT inhaler Inhale 2 puffs into the lungs every 6 (six) hours as needed for wheezing or shortness of breath. 18 g 2   amoxicillin -clavulanate (AUGMENTIN ) 875-125 MG tablet Take 1 tablet by mouth 2 (two) times daily. (Patient not taking: Reported on 09/22/2024) 20 tablet 0   cetirizine (ZYRTEC) 10 MG tablet Take 10 mg by mouth daily as needed for allergies.     cyclobenzaprine  (FLEXERIL ) 10 MG tablet Take 1 tablet (10 mg total) by mouth at bedtime as needed for muscle spasms. 15 tablet 0   fluticasone -salmeterol (ADVAIR ) 250-50 MCG/ACT AEPB Inhale 1 puff into the lungs in the morning and at bedtime. 60 each 11   fluticasone -salmeterol (ADVAIR ) 250-50 MCG/ACT AEPB Inhale 1 puff into the lungs every 12 (twelve) hours. (Patient not taking: Reported on 09/22/2024) 60 each 11   fluticasone -salmeterol (WIXELA INHUB ) 250-50 MCG/ACT AEPB Inhale 1 puff into the lungs every 12 (twelve) hours. (Patient not taking: Reported on 09/22/2024) 60 each 11   ipratropium (ATROVENT ) 0.03 % nasal spray Place 2 sprays into both nostrils every 12 (twelve) hours. (Patient not taking: Reported on 09/22/2024) 30 mL 0   losartan  (COZAAR ) 50 MG tablet Take 1 tablet (50 mg total) by mouth 2 (two) times daily. 180 tablet 3   testosterone  cypionate (DEPOTESTOSTERONE CYPIONATE) 200 MG/ML injection Inject 0.3 mLs (60 mg total) into the muscle 2 (two) times a week on Sunday and Wednesday. 10 mL 1   tiZANidine  (ZANAFLEX ) 4 MG capsule Take 1 capsule (4 mg total) by mouth 3 (three) times daily as needed for muscle spasms. 90 capsule 0   valACYclovir  (VALTREX ) 1000 MG tablet Take 1 tablet (1,000 mg total) by mouth daily. 90 tablet 0   Current Facility-Administered Medications  Medication Dose Route Frequency Provider Last Rate Last Admin   0.9 %  sodium chloride  infusion  500 mL Intravenous Once Kaysee Hergert, Inocente HERO, MD        Allergies as of 09/22/2024   (No Known Allergies)    Family History   Problem Relation Age of Onset   Crohn's disease Mother    Diabetes Mother    Squamous cell carcinoma Mother    Basal cell carcinoma Mother    Hypertension Father    GER disease Father    Asthma Father    Lung disease Maternal Grandmother        Sarcodosis   Brain cancer Maternal Grandfather 56   Lung cancer Paternal Grandmother    Bladder Cancer Paternal Grandfather    Colon cancer Neg Hx    Stomach cancer Neg Hx    Pancreatic cancer Neg Hx    Esophageal cancer Neg Hx     Social History   Socioeconomic History   Marital status: Married    Spouse name: Josette   Number of children: 3   Years of education: Not on file   Highest education  level: Not on file  Occupational History   Not on file  Tobacco Use   Smoking status: Never   Smokeless tobacco: Never  Vaping Use   Vaping status: Never Used  Substance and Sexual Activity   Alcohol use: Yes    Comment: 2-3 typicall day, weekends 6-10   Drug use: No   Sexual activity: Yes    Birth control/protection: I.U.D.    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      Family: son - Will (2007) - gets him about 50% of the time, Josette has 2 children as well, parents nearby with a good relationship      Enjoys: spend time with wife, go the beach, jeep activities      Exercise: just busy life   Diet: typically 1 meal per day      Safety   Seat belts: Yes    Guns: No   Safe in relationships: Yes    Social Drivers of Corporate Investment Banker Strain: Not on file  Food Insecurity: Not on file  Transportation Needs: Not on file  Physical Activity: Not on file  Stress: Not on file  Social Connections: Not on file  Intimate Partner Violence: Not on file    Review of Systems:  All other review  of systems negative except as mentioned in the HPI.  Physical Exam: Vital signs BP (!) 141/72   Pulse 74   Temp 98 F (36.7 C) (Skin)   Ht 5' 7 (1.702 m)   Wt 203 lb (92.1 kg)   SpO2 98%   BMI 31.79 kg/m   General:   Alert,  Well-developed, well-nourished, pleasant and cooperative in NAD Airway:  Mallampati 3 Lungs:  Clear throughout to auscultation.   Heart:  Regular rate and rhythm; no murmurs, clicks, rubs,  or gallops. Abdomen:  Soft, nontender and nondistended. Normal bowel sounds.   Neuro/Psych:  Normal mood and affect. A and O x 3  Inocente Hausen, MD [facility]

## 2024-09-21 ENCOUNTER — Other Ambulatory Visit: Payer: Self-pay

## 2024-09-22 ENCOUNTER — Ambulatory Visit: Admitting: Pediatrics

## 2024-09-22 ENCOUNTER — Other Ambulatory Visit: Payer: Self-pay

## 2024-09-22 ENCOUNTER — Encounter: Payer: Self-pay | Admitting: Pediatrics

## 2024-09-22 VITALS — BP 131/87 | HR 83 | Temp 98.0°F | Resp 16 | Ht 67.0 in | Wt 203.0 lb

## 2024-09-22 DIAGNOSIS — K648 Other hemorrhoids: Secondary | ICD-10-CM

## 2024-09-22 DIAGNOSIS — Z860101 Personal history of adenomatous and serrated colon polyps: Secondary | ICD-10-CM

## 2024-09-22 DIAGNOSIS — Z1211 Encounter for screening for malignant neoplasm of colon: Secondary | ICD-10-CM

## 2024-09-22 DIAGNOSIS — Z860100 Personal history of colon polyps, unspecified: Secondary | ICD-10-CM

## 2024-09-22 DIAGNOSIS — K5730 Diverticulosis of large intestine without perforation or abscess without bleeding: Secondary | ICD-10-CM

## 2024-09-22 DIAGNOSIS — I10 Essential (primary) hypertension: Secondary | ICD-10-CM | POA: Diagnosis not present

## 2024-09-22 DIAGNOSIS — G4730 Sleep apnea, unspecified: Secondary | ICD-10-CM | POA: Diagnosis not present

## 2024-09-22 DIAGNOSIS — K219 Gastro-esophageal reflux disease without esophagitis: Secondary | ICD-10-CM | POA: Diagnosis not present

## 2024-09-22 DIAGNOSIS — J45909 Unspecified asthma, uncomplicated: Secondary | ICD-10-CM | POA: Diagnosis not present

## 2024-09-22 MED ORDER — SODIUM CHLORIDE 0.9 % IV SOLN: 500.0000 mL | Freq: Once | INTRAVENOUS | Status: DC

## 2024-09-22 NOTE — Patient Instructions (Signed)

## 2024-09-22 NOTE — Op Note (Signed)
 [facility] Patient Name: Bradley Schaefer Procedure Date: 09/22/2024 7:36 AM MRN: [id number] Endoscopist: Inocente Hausen , MD, [id number] Age: 43 Referring MD:  Date of Birth: 1980/12/19 Gender: Male Account #: [id number] Procedure:                Colonoscopy Indications:              High risk colon cancer surveillance: Personal                            history of multiple (3 or more) adenomas, Last                            colonoscopy: November 2022 Medicines:                Monitored Anesthesia Care Procedure:                Pre-Anesthesia Assessment:                           - Prior to the procedure, a History and Physical                            was performed, and patient medications and                            allergies were reviewed. The patient's tolerance of                            previous anesthesia was also reviewed. The risks                            and benefits of the procedure and the sedation                            options and risks were discussed with the patient.                            All questions were answered, and informed consent                            was obtained. Prior Anticoagulants: The patient has                            taken no anticoagulant or antiplatelet agents. ASA                            Grade Assessment: II - A patient with mild systemic                            disease. After reviewing the risks and benefits,                            the patient was deemed in satisfactory condition to  undergo the procedure.                           After obtaining informed consent, the colonoscope                            was passed under direct vision. Throughout the                            procedure, the patient's blood pressure, pulse, and                            oxygen saturations were monitored continuously. The                            Olympus Scope SN [id number] was introduced  through the                            anus and advanced to the cecum, identified by                            appendiceal orifice and ileocecal valve. The                            colonoscopy was performed without difficulty. The                            patient tolerated the procedure well. The quality                            of the bowel preparation was good. The ileocecal                            valve, appendiceal orifice, and rectum were                            photographed. Scope In: 8:05:01 AM Scope Out: 8:18:18 AM Scope Withdrawal Time: 0 hours 9 minutes 35 seconds  Total Procedure Duration: 0 hours 13 minutes 17 seconds  Findings:                 Hemorrhoids were found on perianal exam.                           The digital rectal exam was normal. Pertinent                            negatives include normal sphincter tone and no                            palpable rectal lesions.                           A moderate amount of semi-liquid stool was found in  the descending colon, in the transverse colon and                            in the ascending colon. Lavage of the area was                            performed using a moderate amount of sterile water,                            resulting in clearance with adequate visualization.                           A few small-mouthed diverticula were found in the                            ascending colon.                           The colon (entire examined portion) appeared normal.                           Internal hemorrhoids were found during retroflexion. Complications:            No immediate complications. Estimated blood loss:                            None. Estimated Blood Loss:     Estimated blood loss: none. Impression:               - Hemorrhoids found on perianal exam.                           - Stool in the descending colon, in the transverse                            colon  and in the ascending colon.                           - Diverticulosis in the ascending colon.                           - The entire examined colon is normal.                           - Internal hemorrhoids.                           - No specimens collected. Recommendation:           - Discharge patient to home (ambulatory).                           - Repeat colonoscopy in 5 years for surveillance                            given previous finding of 4 tubular adenomas on  previous colonoscopy.                           - The findings and recommendations were discussed                            with the patient's family.                           - Return to referring physician.                           - Patient has a contact number available for                            emergencies. The signs and symptoms of potential                            delayed complications were discussed with the                            patient. Return to normal activities tomorrow.                            Written discharge instructions were provided to the                            patient. Inocente Hausen, MD 09/22/2024 8:22:55 AM This report has been signed electronically.

## 2024-09-22 NOTE — Progress Notes (Signed)
 To pacu, VSS. Report to [email or web address]

## 2024-09-25 ENCOUNTER — Telehealth: Payer: Self-pay | Admitting: *Deleted

## 2024-09-25 NOTE — Telephone Encounter (Signed)
  Follow up Call-     09/22/2024    7:47 AM  Call back number  Post procedure Call Back phone  # [phone]  Permission to leave phone message Yes     Patient questions:  Do you have a fever, pain , or abdominal swelling? No. Pain Score  0 *  Have you tolerated food without any problems? Yes.    Have you been able to return to your normal activities? Yes.    Do you have any questions about your discharge instructions: Diet   No. Medications  No. Follow up visit  No.  Do you have questions or concerns about your Care? No.  Actions: * If pain score is 4 or above: No action needed, pain <4.

## 2024-09-26 ENCOUNTER — Other Ambulatory Visit: Payer: Self-pay

## 2024-09-26 DIAGNOSIS — E291 Testicular hypofunction: Secondary | ICD-10-CM | POA: Diagnosis not present

## 2024-09-26 DIAGNOSIS — D751 Secondary polycythemia: Secondary | ICD-10-CM | POA: Diagnosis not present

## 2024-09-26 MED ORDER — TESTOSTERONE CYPIONATE 200 MG/ML IM SOLN
40.0000 mg | INTRAMUSCULAR | 2 refills | Status: AC
  Filled 2024-09-26: qty 8, 28d supply, fill #0
  Filled 2024-11-09: qty 8, 28d supply, fill #1

## 2024-09-27 ENCOUNTER — Other Ambulatory Visit: Payer: Self-pay

## 2024-10-04 ENCOUNTER — Other Ambulatory Visit: Payer: Self-pay

## 2024-10-12 MED FILL — Fluticasone-Salmeterol Aer Powder BA 250-50 MCG/ACT: RESPIRATORY_TRACT | 30 days supply | Qty: 60 | Fill #8 | Status: AC

## 2024-11-09 ENCOUNTER — Other Ambulatory Visit: Payer: Self-pay

## 2024-11-09 ENCOUNTER — Other Ambulatory Visit: Payer: Self-pay | Admitting: Nurse Practitioner

## 2024-11-09 ENCOUNTER — Other Ambulatory Visit: Payer: Self-pay | Admitting: Adult Health

## 2024-11-10 NOTE — Telephone Encounter (Signed)
 See refill request.

## 2024-11-11 ENCOUNTER — Other Ambulatory Visit: Payer: Self-pay

## 2024-11-14 ENCOUNTER — Other Ambulatory Visit: Payer: Self-pay

## 2024-11-21 ENCOUNTER — Encounter: Payer: Self-pay | Admitting: Pharmacist

## 2024-11-21 ENCOUNTER — Other Ambulatory Visit: Payer: Self-pay | Admitting: Primary Care

## 2024-11-21 ENCOUNTER — Other Ambulatory Visit: Payer: Self-pay

## 2024-11-24 ENCOUNTER — Other Ambulatory Visit: Payer: Self-pay

## 2024-12-06 ENCOUNTER — Other Ambulatory Visit: Payer: Self-pay

## 2024-12-07 ENCOUNTER — Encounter: Payer: Self-pay | Admitting: Primary Care

## 2024-12-07 ENCOUNTER — Other Ambulatory Visit: Payer: Self-pay

## 2024-12-07 ENCOUNTER — Ambulatory Visit: Admitting: Primary Care

## 2024-12-07 VITALS — BP 138/78 | HR 84 | Temp 98.4°F | Ht 67.0 in | Wt 202.0 lb

## 2024-12-07 DIAGNOSIS — G4733 Obstructive sleep apnea (adult) (pediatric): Secondary | ICD-10-CM | POA: Diagnosis not present

## 2024-12-07 DIAGNOSIS — J4520 Mild intermittent asthma, uncomplicated: Secondary | ICD-10-CM

## 2024-12-07 MED ORDER — FLUTICASONE-SALMETEROL 250-50 MCG/ACT IN AEPB
1.0000 | INHALATION_SPRAY | Freq: Two times a day (BID) | RESPIRATORY_TRACT | 11 refills | Status: AC
  Filled 2024-12-07: qty 60, 30d supply, fill #0

## 2024-12-07 NOTE — Progress Notes (Signed)
 "  @Patient  ID: Bradley Schaefer, male    DOB: 18-Apr-1981, 44 y.o.   MRN: [id number]  Chief Complaint  Patient presents with   Medical Management of Chronic Issues    Wearing CPAP doing good, denies sob/cough. Needs refill on Advair     Referring provider: Wendee Lynwood HERO, NP  HPI: 44 year old male, never smoked. PMH significant for HTN, mild intermittent asthma, ADHD.   Previous [facility] pulmonary encounter:  11/07/2021 Patient presents today for sleep consult. He has symptoms of loud snoring, witnessed apnea, restless sleep and daytime sleepiness. Symptoms are worse since having covid in March 2022. He works day shift as a engineer, civil (consulting). He had a colonoscopy recently and had issues to apnea. Typical bedtime is between 10pm-12am. He wakes up on average 5-10 times a night.   He saw his PCP on 10/03/21 for mild intermittent asthma symptoms, treated with 6 day course of prednisone . Maintained on Advair  100mcg 1 puff twice daily and cetirizine. He has noticed increased shortness of breath as well since having covid. He uses his albuterol  rescue inhaler 5-6 times a week.   Sleep questionnaire: Prior sleep study- None Symptoms- loud snoring, choking, stops breathing during sleep, restless sleep, daytime sleepiness Bedtime- 10pm-12am  Nocturnal awakenings- 5-10 times Out of bed- 5:30am/ 8-10am Weight- up 10 lbs  [facility]- 9  12/31/2021 Patient presents today to review sleep study results/Pulmonary function testing. Patient had sleep study with [facility] on 11/27/21 which showed moderate-severe OSA, AHI 26.6/hr with SpO2 low 75%. Discussed risk of untreated sleep apnea and treatment options. He is open to starting CPAP since his sleep has worsened and he continues to have daytime fatigue. He is maintained on Adviar 250-50mcg one puffs morning and evening for asthma. PFTs were normal. Feels higher dose ICS inhaler has helps his breathing, not having as much shortness of breath and using Albuterol  less.     09/11/2022 Patient presents today for 6 month follow-up.  He is doing well without acute complaints. Fatigue and sleep have improved since starting CPAP. He feels his energy level is back to baseline. He is having no issues with mask fit or pressure settings. CPAP currently on auto settings 5-15cm h20. He uses nasal mask.  He works 12 hour day shift. He is getting 7.5 hours of usage. He is using nasal pillow mask. DME is [facility]  [facility] download 08/11/22-09/09/22 Usage 30/30 days; 29 days (97%) > 4 hours  Average usage 7 hours 32 mins Pressure 5-15cm h20 (10.9cm h20-95%) Airleaks 2L/min (95%) AHI 1.3  09/17/2023 Patient contacted today for 1 year follow-up OSA. He remains compliant with CPAP and reports benefit. No issues with mask fit or pressure settings.  He is inquiring about travel CPAP machine for when he is camping. No changes recommended today.  Airview download 08/17/2023 - 09/15/2023 30/30 days (100%); 29 days (97%) greater than 4 hours Average usage 7 hours 54 minutes Pressure 5 to 15 cm H2O (11.4cm h20-95%) Air leaks 2.6 L/min (95%) AHI 0.9   Moderate OSA - Home sleep on 11/27/21 showed moderate-severe OSA, AHI 26.6/h - He is 97% compliant with use last 30 days >4 hours. No issues with mask fit or pressure settings. Reports improvement in fatigue and sleep quality with use.  - Current CPAP pressure 5-15cm h20 without residual apneas - No changes today, he will notify office he would like an order for travel CPAP machine  - Renew CPAP supplies x 1 year with [facility]    12/07/2024- Interim hx  Discussed the use of AI scribe software for clinical note transcription with the patient, who gave verbal consent to proceed.  History of Present Illness Bradley Schaefer is a 44 year old male with sleep apnea and asthma who presents for a one year follow-up.  He consistently uses his CPAP machine, averaging eight hours and one minute of use per night. The machine is set to auto settings  with a minimum pressure of five and a maximum of fifteen, with an average pressure of 8.5, occasionally reaching thirteen. His apnea score is one event per hour. He recently acquired a portable CPAP machine for travel and backpacking with his son, which he has not yet used.  For asthma management, he uses Wixela (fluticasone /salmeterol) at a dose of 250-50mcg, one puff in the morning and evening. No significant asthma exacerbations have occurred, but he notes increased bronchoconstriction with cold air exposure, especially when trying to be more active. He currently does not require a refill for his albuterol  rescue inhaler as it is up to date.      Allergies[1]  Immunization History  Administered Date(s) Administered   Influenza Split 08/02/2024   Influenza,inj,Quad PF,6+ Mos 09/01/2022   Influenza-Unspecified 07/29/2017, 07/25/2018, 07/28/2019, 07/03/2021   PFIZER(Purple Top)SARS-COV-2 Vaccination 08/13/2020   Tdap 04/15/2016, 04/25/2023    Past Medical History:  Diagnosis Date   ADD (attention deficit disorder)    Asthma    Dislocation of left patella 12/22/2018   GERD (gastroesophageal reflux disease)    Herpes simplex    Hypertension    Sleep apnea 2022   Varicose veins     Tobacco History: Tobacco Use History[2] Counseling given: Not Answered   Outpatient Medications Prior to Visit  Medication Sig Dispense Refill   albuterol  (VENTOLIN  HFA) 108 (90 Base) MCG/ACT inhaler Inhale 2 puffs into the lungs every 6 (six) hours as needed for wheezing or shortness of breath. 18 g 2   cetirizine (ZYRTEC) 10 MG tablet Take 10 mg by mouth daily as needed for allergies.     cyclobenzaprine  (FLEXERIL ) 10 MG tablet Take 1 tablet (10 mg total) by mouth at bedtime as needed for muscle spasms. 15 tablet 0   esomeprazole  (NEXIUM ) 40 MG capsule Take 1 capsule (40 mg total) by mouth daily. 90 capsule 3   fluticasone -salmeterol (ADVAIR ) 250-50 MCG/ACT AEPB Inhale 1 puff into the lungs in the  morning and at bedtime. 60 each 11   losartan  (COZAAR ) 50 MG tablet Take 1 tablet (50 mg total) by mouth 2 (two) times daily. 180 tablet 3   testosterone  cypionate (DEPOTESTOSTERONE CYPIONATE) 200 MG/ML injection Inject 0.3 mLs (60 mg total) into the muscle 2 (two) times a week on Sunday and Wednesday. 10 mL 1   testosterone  cypionate (DEPOTESTOSTERONE CYPIONATE) 200 MG/ML injection Inject 0.2 mLs (40 mg total) into the muscle 2 (two) times a week. 8 mL 2   tiZANidine  (ZANAFLEX ) 4 MG capsule Take 1 capsule (4 mg total) by mouth 3 (three) times daily as needed for muscle spasms. 90 capsule 0   valACYclovir  (VALTREX ) 1000 MG tablet Take 1 tablet (1,000 mg total) by mouth daily. 90 tablet 0   amoxicillin -clavulanate (AUGMENTIN ) 875-125 MG tablet Take 1 tablet by mouth 2 (two) times daily. (Patient not taking: Reported on 12/07/2024) 20 tablet 0   fluticasone -salmeterol (ADVAIR ) 250-50 MCG/ACT AEPB Inhale 1 puff into the lungs every 12 (twelve) hours. (Patient not taking: Reported on 09/22/2024) 60 each 11   fluticasone -salmeterol (WIXELA INHUB ) 250-50 MCG/ACT AEPB Inhale 1 puff  into the lungs every 12 (twelve) hours. (Patient not taking: Reported on 09/22/2024) 60 each 11   ipratropium (ATROVENT ) 0.03 % nasal spray Place 2 sprays into both nostrils every 12 (twelve) hours. (Patient not taking: Reported on 12/07/2024) 30 mL 0   No facility-administered medications prior to visit.      Review of Systems  Review of Systems  Constitutional: Negative.   Respiratory: Negative.     Physical Exam  There were no vitals taken for this visit. Physical Exam Constitutional:      Appearance: Normal appearance. He is well-developed.  HENT:     Head: Normocephalic and atraumatic.     Mouth/Throat:     Mouth: Mucous membranes are moist.     Pharynx: Oropharynx is clear.  Cardiovascular:     Rate and Rhythm: Normal rate and regular rhythm.     Heart sounds: Normal heart sounds.  Pulmonary:     Effort:  Pulmonary effort is normal. No respiratory distress.     Breath sounds: Normal breath sounds. No wheezing or rhonchi.  Musculoskeletal:        General: Normal range of motion.     Cervical back: Normal range of motion and neck supple.  Skin:    General: Skin is warm and dry.     Findings: No erythema or rash.  Neurological:     General: No focal deficit present.     Mental Status: He is alert and oriented to person, place, and time. Mental status is at baseline.  Psychiatric:        Mood and Affect: Mood normal.        Behavior: Behavior normal.        Thought Content: Thought content normal.        Judgment: Judgment normal.      Lab Results:  CBC    Component Value Date/Time   WBC 7.0 01/11/2024 0858   RBC 5.20 01/11/2024 0858   HGB 16.0 01/11/2024 0858   HCT 46.5 01/11/2024 0858   PLT 263.0 01/11/2024 0858   MCV 89.5 01/11/2024 0858   MCHC 34.3 01/11/2024 0858   RDW 13.7 01/11/2024 0858   LYMPHSABS 2.8 07/24/2021 1108   MONOABS 0.9 07/24/2021 1108   EOSABS 0.1 07/24/2021 1108   BASOSABS 0.0 07/24/2021 1108    BMET    Component Value Date/Time   NA 138 01/11/2024 0858   K 4.3 01/11/2024 0858   CL 102 01/11/2024 0858   CO2 31 01/11/2024 0858   GLUCOSE 108 (H) 01/11/2024 0858   BUN 8 01/11/2024 0858   CREATININE 0.95 01/11/2024 0858   CALCIUM 9.5 01/11/2024 0858   GFRNONAA 105.43 04/05/2009 0933   GFRAA 130 11/29/2006 1022    BNP No results found for: BNP  ProBNP No results found for: PROBNP  Imaging: No results found.   Assessment & Plan:   No problem-specific Assessment & Plan notes found for this encounter.  1. OSA (obstructive sleep apnea) (Primary) - Ambulatory Referral for DME  2. Mild intermittent asthma without complication  Assessment and Plan Assessment & Plan Obstructive sleep apnea Well controlled with CPAP therapy. Usage is 100% with an average of 8 hours and 1 minute per night. Current pressure 5-15cm h20. Apnea-hypopnea  index is 1 event per hour, indicating good control. Current CPAP machine and supplies are functioning well and are up to date. He has acquired a new portable CPAP machine for travel, which is a smart decision for future use. - Renewed CPAP  supplies for one year through [facility]  - Continue current CPAP settings and usage - FU in 1 year or sooner if needed   Asthma Mild intermittent asthma with recent increase in bronchoconstriction, likely due to cold air exposure. No recent exacerbations reported requiring abx or oral steriods. Current management includes daily use of Advair  250-50mcg one puff twice daily and as-needed use of albuterol . He is advised to premedicate with albuterol  before strenuous activity and to avoid outdoor exercise during extreme temperature variation when possible. - Refilled Advair  250-50 mcg inhalation, one puff morning and evening  Recording duration: 6 minutes   Almarie LELON Ferrari, NP 12/07/2024     [1] No Known Allergies [2]  Social History Tobacco Use  Smoking Status Never  Smokeless Tobacco Never   "

## 2024-12-07 NOTE — Patient Instructions (Addendum)
" °  VISIT SUMMARY: During your visit, we reviewed your management of sleep apnea and asthma. You are doing well with your CPAP therapy and asthma management, and we discussed some adjustments to help you manage your asthma better in cold weather.  YOUR PLAN: -OBSTRUCTIVE SLEEP APNEA: Obstructive sleep apnea is a condition where your breathing stops and starts repeatedly during sleep. Your condition is well controlled with your CPAP therapy, which you are using consistently. Your current CPAP settings and supplies are working well, and you have wisely acquired a portable CPAP machine for travel. We have renewed your CPAP supplies for one year. Continue using your CPAP machine as you have been.  -ASTHMA: Asthma is a condition in which your airways narrow and swell, producing extra mucus and making it difficult to breathe. Your asthma is mild and intermittent, but you have noticed increased bronchoconstriction in cold weather. Continue using your Advair  inhaler twice daily as directed and your albuterol  inhaler as needed. We recommend using albuterol  before strenuous activity, wearing a scarf or gaiter to warm the air you breathe in cold weather, and avoiding outdoor exercise when possible.  INSTRUCTIONS: Continue with your current CPAP settings and usage. Use your Advair  inhaler daily and albuterol  as needed, especially before strenuous activity. Wear a scarf or gaiter in cold weather to help with your asthma, and try to avoid outdoor exercise when possible. Follow up with your primary care provider annually for lab work.  Orders: DME order to renew CPAP supplies with [facility]  Follow-up 12 months with Landry NP (virtual visit ok)  Contains text generated by [employer].   "
